# Patient Record
Sex: Female | Born: 1990 | Race: Black or African American | Hispanic: No | Marital: Married | Smoking: Never smoker
Health system: Southern US, Community
[De-identification: ages and names within clinical notes are randomized; demographics above are authoritative.]

## PROBLEM LIST (undated history)

## (undated) DIAGNOSIS — J329 Chronic sinusitis, unspecified: Secondary | ICD-10-CM

## (undated) DIAGNOSIS — D509 Iron deficiency anemia, unspecified: Secondary | ICD-10-CM

## (undated) DIAGNOSIS — Z789 Other specified health status: Secondary | ICD-10-CM

## (undated) HISTORY — DX: Iron deficiency anemia, unspecified: D50.9

## (undated) HISTORY — PX: APPENDECTOMY: SHX54

---

## 2013-05-14 ENCOUNTER — Ambulatory Visit: Payer: 59

## 2013-06-29 ENCOUNTER — Emergency Department (HOSPITAL_BASED_OUTPATIENT_CLINIC_OR_DEPARTMENT_OTHER)
Admission: EM | Admit: 2013-06-29 | Discharge: 2013-06-29 | Disposition: A | Discharge status: Home / Self Care | Payer: 59 | Attending: Emergency Medicine | Admitting: Emergency Medicine

## 2013-06-29 ENCOUNTER — Encounter (HOSPITAL_BASED_OUTPATIENT_CLINIC_OR_DEPARTMENT_OTHER): Payer: Self-pay | Admitting: Emergency Medicine

## 2013-06-29 ENCOUNTER — Emergency Department (HOSPITAL_BASED_OUTPATIENT_CLINIC_OR_DEPARTMENT_OTHER): Payer: 59

## 2013-06-29 DIAGNOSIS — Y92410 Unspecified street and highway as the place of occurrence of the external cause: Secondary | ICD-10-CM | POA: Insufficient documentation

## 2013-06-29 DIAGNOSIS — IMO0002 Reserved for concepts with insufficient information to code with codable children: Secondary | ICD-10-CM | POA: Insufficient documentation

## 2013-06-29 DIAGNOSIS — M549 Dorsalgia, unspecified: Secondary | ICD-10-CM

## 2013-06-29 DIAGNOSIS — Y9389 Activity, other specified: Secondary | ICD-10-CM | POA: Insufficient documentation

## 2013-06-29 MED ORDER — IBUPROFEN 800 MG PO TABS: 800.0000 mg | ORAL_TABLET | Freq: Three times a day (TID) | ORAL | Status: DC

## 2013-06-29 MED ORDER — IBUPROFEN 800 MG PO TABS
800.0000 mg | ORAL_TABLET | Freq: Once | ORAL | Status: AC
  Administered 2013-06-29: 800 mg via ORAL
  Filled 2013-06-29: qty 1
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
## 2013-06-29 NOTE — ED Provider Notes (Signed)
Medical screening examination/treatment/procedure(s) were performed by non-physician practitioner and as supervising physician I was immediately available for consultation/collaboration.  EKG Interpretation   None         William Herbert Aguinaldo, MD 06/29/13 1610 

## 2013-06-29 NOTE — Discharge Instructions (Signed)
°Back Pain, Adult °Back pain is very common. The pain often gets better over time. The cause of back pain is usually not dangerous. Most people can learn to manage their back pain on their own.  °HOME CARE  °· Stay active. Start with short walks on flat ground if you can. Try to walk farther each day. °· Do not sit, drive, or stand in one place for more than 30 minutes. Do not stay in bed. °· Do not avoid exercise or work. Activity can help your back heal faster. °· Be careful when you bend or lift an object. Bend at your knees, keep the object close to you, and do not twist. °· Sleep on a firm mattress. Lie on your side, and bend your knees. If you lie on your back, put a pillow under your knees. °· Only take medicines as told by your doctor. °· Put ice on the injured area. °· Put ice in a plastic bag. °· Place a towel between your skin and the bag. °· Leave the ice on for 15-20 minutes, 03-04 times a day for the first 2 to 3 days. After that, you can switch between ice and heat packs. °· Ask your doctor about back exercises or massage. °· Avoid feeling anxious or stressed. Find good ways to deal with stress, such as exercise. °GET HELP RIGHT AWAY IF:  °· Your pain does not go away with rest or medicine. °· Your pain does not go away in 1 week. °· You have new problems. °· You do not feel well. °· The pain spreads into your legs. °· You cannot control when you poop (bowel movement) or pee (urinate). °· Your arms or legs feel weak or lose feeling (numbness). °· You feel sick to your stomach (nauseous) or throw up (vomit). °· You have belly (abdominal) pain. °· You feel like you may pass out (faint). °MAKE SURE YOU:  °· Understand these instructions. °· Will watch your condition. °· Will get help right away if you are not doing well or get worse. °Document Released: 10/08/2007 Document Revised: 07/14/2011 Document Reviewed: 09/09/2010 °ExitCare® Patient Information ©2014 [employer]. °Motor Vehicle Collision  °It  is common to have multiple bruises and sore muscles after a motor vehicle collision (MVC). These tend to feel worse for the first 24 hours. You may have the most stiffness and soreness over the first several hours. You may also feel worse when you wake up the first morning after your collision. After this point, you will usually begin to improve with each day. The speed of improvement often depends on the severity of the collision, the number of injuries, and the location and nature of these injuries. °HOME CARE INSTRUCTIONS  °· Put ice on the injured area. °· Put ice in a plastic bag. °· Place a towel between your skin and the bag. °· Leave the ice on for 15-20 minutes, 03-04 times a day. °· Drink enough fluids to keep your urine clear or pale yellow. Do not drink alcohol. °· Take a warm shower or bath once or twice a day. This will increase blood flow to sore muscles. °· You may return to activities as directed by your caregiver. Be careful when lifting, as this may aggravate neck or back pain. °· Only take over-the-counter or prescription medicines for pain, discomfort, or fever as directed by your caregiver. Do not use aspirin. This may increase bruising and bleeding. °SEEK IMMEDIATE MEDICAL CARE IF: °· You have numbness, tingling, or   weakness in the arms or legs. °· You develop severe headaches not relieved with medicine. °· You have severe neck pain, especially tenderness in the middle of the back of your neck. °· You have changes in bowel or bladder control. °· There is increasing pain in any area of the body. °· You have shortness of breath, lightheadedness, dizziness, or fainting. °· You have chest pain. °· You feel sick to your stomach (nauseous), throw up (vomit), or sweat. °· You have increasing abdominal discomfort. °· There is blood in your urine, stool, or vomit. °· You have pain in your shoulder (shoulder strap areas). °· You feel your symptoms are getting worse. °MAKE SURE YOU:  °· Understand these  instructions. °· Will watch your condition. °· Will get help right away if you are not doing well or get worse. °Document Released: 04/21/2005 Document Revised: 07/14/2011 Document Reviewed: 09/18/2010 °ExitCare® Patient Information ©2014 [employer]. ° °
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
## 2014-05-05 NOTE — L&D Delivery Note (Signed)
Delivery Note At 1:48 AM a viable female was delivered via Vaginal, Spontaneous Delivery (Presentation: ;  ).  APGAR: , ; weight  .   Placenta status: , .  Cord:  with the following complications: .  Cord pH: not done  Anesthesia:   Episiotomy:   Lacerations:   Suture Repair: 2.0 vicryl Est. Blood Loss (mL):    Mom to postpartum.  Baby to Couplet care / Skin to Skin.  MARSHALL,BERNARD A 08/09/2014, 2:18 AM

## 2014-05-11 ENCOUNTER — Encounter (HOSPITAL_COMMUNITY): Payer: Self-pay | Admitting: *Deleted

## 2014-05-11 ENCOUNTER — Inpatient Hospital Stay (HOSPITAL_COMMUNITY)
Admission: AD | Admit: 2014-05-11 | Discharge: 2014-05-11 | Disposition: A | Discharge status: Home / Self Care | Payer: Medicaid Other | Source: Ambulatory Visit | Attending: Obstetrics & Gynecology | Admitting: Obstetrics & Gynecology

## 2014-05-11 DIAGNOSIS — O9989 Other specified diseases and conditions complicating pregnancy, childbirth and the puerperium: Secondary | ICD-10-CM | POA: Insufficient documentation

## 2014-05-11 DIAGNOSIS — Z3A24 24 weeks gestation of pregnancy: Secondary | ICD-10-CM | POA: Insufficient documentation

## 2014-05-11 DIAGNOSIS — R109 Unspecified abdominal pain: Secondary | ICD-10-CM

## 2014-05-11 DIAGNOSIS — N949 Unspecified condition associated with female genital organs and menstrual cycle: Secondary | ICD-10-CM

## 2014-05-11 DIAGNOSIS — Z3A26 26 weeks gestation of pregnancy: Secondary | ICD-10-CM

## 2014-05-11 HISTORY — DX: Chronic sinusitis, unspecified: J32.9

## 2014-05-11 LAB — URINALYSIS, ROUTINE W REFLEX MICROSCOPIC
Bilirubin Urine: NEGATIVE
GLUCOSE, UA: NEGATIVE mg/dL
Hgb urine dipstick: NEGATIVE
Ketones, ur: NEGATIVE mg/dL
Nitrite: NEGATIVE
PH: 7 (ref 5.0–8.0)
Protein, ur: NEGATIVE mg/dL
SPECIFIC GRAVITY, URINE: 1.01 (ref 1.005–1.030)
Urobilinogen, UA: 0.2 mg/dL (ref 0.0–1.0)

## 2014-05-11 LAB — URINE MICROSCOPIC-ADD ON

## 2014-05-11 NOTE — Discharge Instructions (Signed)

## 2014-05-11 NOTE — MAU Note (Signed)
Pt reprots she has not been feeling well. Feeling nauseated and dizzy. C/O sharp RLQ that starte 2 days ago.

## 2014-05-11 NOTE — MAU Note (Signed)
Urine in lab 

## 2014-05-11 NOTE — MAU Provider Note (Signed)
History    HPI: Lauren Potter is a 24 y.o. year old G1P0 female at [redacted]w[redacted]d weeks gestation who presents to MAU reporting abdominal pain (diffuse) described as burning in nature. And right sided abdominal tenderness. Patient states this pain began about 2 days ago and has gotten worse since that time. The diffuse abdominal pain is reportedly unassociated from the rt-sided pain. The diffuse pain is burning and localized bilaterally in the lower quadrants and is normally accompanied with some back pain. This pain is worsened with prolonged standing and movement. Patient reported some constipation.  Patient denied fever, chills, n/v/d, HA, vision change, weakness, dysuria, polydipsia, VB, vaginal discharge.  Patient is from the [place]. She received her initial prenatal care there for this pregnancy. She reports some sparse vaginal bleeding initially during this pregnancy. This was followed by 2 separate US's in [place] and was told that she was ok. Patient could not remember any specific details. She came back to the [place] in November (after 5months in [place]). Plans to see Dr. Gaynell Face next week.  PSH: patient is s/p appendectomy from "a long time ago"   CSN: [id number]  Arrival date and time: 05/11/14 1505   None     Chief Complaint  Patient presents with  . Dizziness  . Abdominal Pain   HPI  OB History    Gravida Para Term Preterm AB TAB SAB Ectopic Multiple Living   1         0      Past Medical History  Diagnosis Date  . Frequent sinus infections   . Depression     Past Surgical History  Procedure Laterality Date  . Appendectomy      History reviewed. No pertinent family history.  History  Substance Use Topics  . Smoking status: Never Smoker   . Smokeless tobacco: Not on file  . Alcohol Use: No    Allergies: No Known Allergies  Prescriptions prior to admission  Medication Sig Dispense Refill Last Dose  . ibuprofen (ADVIL,MOTRIN) 800 MG tablet Take 1 tablet (800 mg  total) by mouth 3 (three) times daily. (Patient not taking: Reported on 05/11/2014) 21 tablet 0     ROS Physical Exam   Blood pressure 128/71, pulse 66, temperature 98.1 F (36.7 C), resp. rate 18, height  (1.727 m), weight 81.103 kg (178 lb 12.8 oz), last menstrual period 11/22/2013.  Physical Exam  Constitutional: She is oriented to person, place, and time. She appears well-developed and well-nourished. No distress.  HENT:  Head: Normocephalic and atraumatic.  Eyes: Conjunctivae and EOM are normal. Pupils are equal, round, and reactive to light.  Neck: Normal range of motion. Neck supple.  Cardiovascular: Normal rate, regular rhythm, normal heart sounds and intact distal pulses.   Respiratory: Effort normal and breath sounds normal. She has no wheezes.  GI: Soft. There is tenderness. There is no rebound and no guarding.  RLQ and LLQ (R>L)  Genitourinary: No vaginal discharge found.  Musculoskeletal: Normal range of motion. She exhibits no edema.  Neurological: She is alert and oriented to person, place, and time.  Skin: Skin is warm and dry. No rash noted. She is not diaphoretic. No erythema.  Psychiatric: She has a normal mood and affect. Her behavior is normal. Judgment and thought content normal.    MAU Course  Procedures  MDM Pain patient is reporting is likely due to ligamental changes. Patient is a G1P0 and likely experiencing these changes for the first time. Ddx includes UTI/pyelonephritis/constipation.  UA positive for leukocytes but negative for all other signs/symptoms. Some Rt sided flank pain but no history of dysuria. No fevers/tachycardia.  Assessment and Plan  - encouraged patient to keep appt w/ obgyn for establishment of care. - sent urine sample for Urine Culture. - antibiotic therapy inappropriate at this time. Will watch for cultures. - DC from MAU   McKeag, Janine Oresan D 05/11/2014, 6:25 PM    I have seen and examined this patient and agree the above  assessment. CRESENZO-DISHMAN,Karandeep Resende 05/11/2014 7:14 PM

## 2014-05-11 NOTE — MAU Note (Signed)
States pain comes and goes and hurts on R side when she tries to sleep.

## 2014-05-12 LAB — CULTURE, OB URINE
Colony Count: NO GROWTH
Culture: NO GROWTH

## 2014-05-16 LAB — PROCEDURE REPORT - SCANNED: PAP SMEAR: NEGATIVE

## 2014-05-17 LAB — OB RESULTS CONSOLE RUBELLA ANTIBODY, IGM: RUBELLA: IMMUNE

## 2014-05-17 LAB — OB RESULTS CONSOLE ANTIBODY SCREEN: Antibody Screen: NEGATIVE

## 2014-05-17 LAB — OB RESULTS CONSOLE HIV ANTIBODY (ROUTINE TESTING): HIV: NONREACTIVE

## 2014-05-17 LAB — OB RESULTS CONSOLE RPR: RPR: NONREACTIVE

## 2014-05-17 LAB — OB RESULTS CONSOLE GC/CHLAMYDIA
Chlamydia: NEGATIVE
Gonorrhea: NEGATIVE

## 2014-05-17 LAB — OB RESULTS CONSOLE HEPATITIS B SURFACE ANTIGEN: HEP B S AG: NEGATIVE

## 2014-05-17 LAB — OB RESULTS CONSOLE ABO/RH: RH TYPE: POSITIVE

## 2014-07-25 LAB — OB RESULTS CONSOLE GBS: STREP GROUP B AG: POSITIVE

## 2014-08-08 ENCOUNTER — Inpatient Hospital Stay (HOSPITAL_COMMUNITY)
Admission: AD | Admit: 2014-08-08 | Discharge: 2014-08-11 | DRG: 775 | Disposition: A | Discharge status: Home / Self Care | Payer: Medicaid Other | Source: Ambulatory Visit | Attending: Obstetrics | Admitting: Obstetrics

## 2014-08-08 ENCOUNTER — Encounter (HOSPITAL_COMMUNITY): Payer: Self-pay | Admitting: *Deleted

## 2014-08-08 DIAGNOSIS — O99824 Streptococcus B carrier state complicating childbirth: Principal | ICD-10-CM | POA: Diagnosis present

## 2014-08-08 DIAGNOSIS — IMO0001 Reserved for inherently not codable concepts without codable children: Secondary | ICD-10-CM

## 2014-08-08 HISTORY — DX: Other specified health status: Z78.9

## 2014-08-08 LAB — CBC
HCT: 36.3 % (ref 36.0–46.0)
Hemoglobin: 12.1 g/dL (ref 12.0–15.0)
MCH: 23.1 pg — ABNORMAL LOW (ref 26.0–34.0)
MCHC: 33.3 g/dL (ref 30.0–36.0)
MCV: 69.4 fL — ABNORMAL LOW (ref 78.0–100.0)
Platelets: 167 10*3/uL (ref 150–400)
RBC: 5.23 MIL/uL — AB (ref 3.87–5.11)
RDW: 15.8 % — AB (ref 11.5–15.5)
WBC: 6.5 10*3/uL (ref 4.0–10.5)

## 2014-08-08 LAB — TYPE AND SCREEN
ABO/RH(D): B POS
Antibody Screen: NEGATIVE

## 2014-08-08 LAB — ABO/RH: ABO/RH(D): B POS

## 2014-08-08 LAB — POCT FERN TEST: POCT Fern Test: POSITIVE

## 2014-08-08 MED ORDER — OXYCODONE-ACETAMINOPHEN 5-325 MG PO TABS
1.0000 | ORAL_TABLET | ORAL | Status: DC | PRN
  Administered 2014-08-09 – 2014-08-11 (×2): 1 via ORAL
  Filled 2014-08-08 (×2): qty 1

## 2014-08-08 MED ORDER — LACTATED RINGERS IV SOLN
INTRAVENOUS | Status: DC
  Administered 2014-08-08: 19:00:00 via INTRAVENOUS

## 2014-08-08 MED ORDER — OXYTOCIN 40 UNITS IN LACTATED RINGERS INFUSION - SIMPLE MED
62.5000 mL/h | INTRAVENOUS | Status: DC
  Administered 2014-08-09: 62.5 mL/h via INTRAVENOUS
  Filled 2014-08-08: qty 1000

## 2014-08-08 MED ORDER — ONDANSETRON HCL 4 MG/2ML IJ SOLN: 4.0000 mg | Freq: Four times a day (QID) | INTRAMUSCULAR | Status: DC | PRN

## 2014-08-08 MED ORDER — LACTATED RINGERS IV SOLN: 500.0000 mL | INTRAVENOUS | Status: DC | PRN

## 2014-08-08 MED ORDER — OXYTOCIN BOLUS FROM INFUSION
500.0000 mL | INTRAVENOUS | Status: DC
  Administered 2014-08-09: 500 mL via INTRAVENOUS

## 2014-08-08 MED ORDER — LIDOCAINE HCL (PF) 1 % IJ SOLN
30.0000 mL | INTRAMUSCULAR | Status: AC | PRN
  Administered 2014-08-09: 30 mL via SUBCUTANEOUS
  Filled 2014-08-08: qty 30

## 2014-08-08 MED ORDER — BUTORPHANOL TARTRATE 1 MG/ML IJ SOLN: 1.0000 mg | INTRAMUSCULAR | Status: DC | PRN

## 2014-08-08 MED ORDER — CITRIC ACID-SODIUM CITRATE 334-500 MG/5ML PO SOLN
30.0000 mL | ORAL | Status: DC | PRN
Start: 2014-08-08 — End: 2014-08-09

## 2014-08-08 MED ORDER — ACETAMINOPHEN 325 MG PO TABS: 650.0000 mg | ORAL_TABLET | ORAL | Status: DC | PRN

## 2014-08-08 MED ORDER — PENICILLIN G POTASSIUM 5000000 UNITS IJ SOLR
2.5000 10*6.[IU] | INTRAMUSCULAR | Status: DC
  Administered 2014-08-08: 2.5 10*6.[IU] via INTRAVENOUS
  Filled 2014-08-08 (×5): qty 2.5

## 2014-08-08 MED ORDER — PENICILLIN G POTASSIUM 5000000 UNITS IJ SOLR
5.0000 10*6.[IU] | Freq: Once | INTRAVENOUS | Status: AC
  Administered 2014-08-08: 5 10*6.[IU] via INTRAVENOUS
  Filled 2014-08-08: qty 5

## 2014-08-08 MED ORDER — OXYCODONE-ACETAMINOPHEN 5-325 MG PO TABS: 2.0000 | ORAL_TABLET | ORAL | Status: DC | PRN

## 2014-08-08 NOTE — MAU Note (Signed)
SROM around 1700, pink fluid.  Pt continues to leak in triage.  Contractions since this a.m.

## 2014-08-09 ENCOUNTER — Encounter (HOSPITAL_COMMUNITY): Payer: Self-pay | Admitting: *Deleted

## 2014-08-09 LAB — CBC
HEMATOCRIT: 31.1 % — AB (ref 36.0–46.0)
Hemoglobin: 10.1 g/dL — ABNORMAL LOW (ref 12.0–15.0)
MCH: 22.3 pg — AB (ref 26.0–34.0)
MCHC: 32.5 g/dL (ref 30.0–36.0)
MCV: 68.8 fL — ABNORMAL LOW (ref 78.0–100.0)
Platelets: 153 10*3/uL (ref 150–400)
RBC: 4.52 MIL/uL (ref 3.87–5.11)
RDW: 15.7 % — ABNORMAL HIGH (ref 11.5–15.5)
WBC: 11 10*3/uL — ABNORMAL HIGH (ref 4.0–10.5)

## 2014-08-09 LAB — RPR: RPR Ser Ql: NONREACTIVE

## 2014-08-09 LAB — HIV ANTIBODY (ROUTINE TESTING W REFLEX): HIV Screen 4th Generation wRfx: NONREACTIVE

## 2014-08-09 MED ORDER — ONDANSETRON HCL 4 MG/2ML IJ SOLN: 4.0000 mg | INTRAMUSCULAR | Status: DC | PRN

## 2014-08-09 MED ORDER — DIPHENHYDRAMINE HCL 25 MG PO CAPS: 25.0000 mg | ORAL_CAPSULE | Freq: Four times a day (QID) | ORAL | Status: DC | PRN

## 2014-08-09 MED ORDER — DIBUCAINE 1 % RE OINT: 1.0000 "application " | TOPICAL_OINTMENT | RECTAL | Status: DC | PRN

## 2014-08-09 MED ORDER — FERROUS SULFATE 325 (65 FE) MG PO TABS
325.0000 mg | ORAL_TABLET | Freq: Two times a day (BID) | ORAL | Status: DC
  Administered 2014-08-09 – 2014-08-11 (×5): 325 mg via ORAL
  Filled 2014-08-09 (×5): qty 1

## 2014-08-09 MED ORDER — SENNOSIDES-DOCUSATE SODIUM 8.6-50 MG PO TABS
2.0000 | ORAL_TABLET | ORAL | Status: DC
  Administered 2014-08-10 – 2014-08-11 (×2): 2 via ORAL
  Filled 2014-08-09 (×2): qty 2

## 2014-08-09 MED ORDER — SIMETHICONE 80 MG PO CHEW: 80.0000 mg | CHEWABLE_TABLET | ORAL | Status: DC | PRN

## 2014-08-09 MED ORDER — LANOLIN HYDROUS EX OINT: TOPICAL_OINTMENT | CUTANEOUS | Status: DC | PRN

## 2014-08-09 MED ORDER — OXYCODONE-ACETAMINOPHEN 5-325 MG PO TABS: 2.0000 | ORAL_TABLET | ORAL | Status: DC | PRN

## 2014-08-09 MED ORDER — IBUPROFEN 600 MG PO TABS
600.0000 mg | ORAL_TABLET | Freq: Four times a day (QID) | ORAL | Status: DC
Start: 2014-08-09 — End: 2014-08-11
  Administered 2014-08-09 – 2014-08-11 (×8): 600 mg via ORAL
  Filled 2014-08-09 (×8): qty 1

## 2014-08-09 MED ORDER — BENZOCAINE-MENTHOL 20-0.5 % EX AERO
1.0000 "application " | INHALATION_SPRAY | CUTANEOUS | Status: DC | PRN
  Administered 2014-08-09: 1 via TOPICAL
  Filled 2014-08-09: qty 56

## 2014-08-09 MED ORDER — ZOLPIDEM TARTRATE 5 MG PO TABS: 5.0000 mg | ORAL_TABLET | Freq: Every evening | ORAL | Status: DC | PRN

## 2014-08-09 MED ORDER — ONDANSETRON HCL 4 MG PO TABS: 4.0000 mg | ORAL_TABLET | ORAL | Status: DC | PRN

## 2014-08-09 MED ORDER — TETANUS-DIPHTH-ACELL PERTUSSIS 5-2.5-18.5 LF-MCG/0.5 IM SUSP
0.5000 mL | Freq: Once | INTRAMUSCULAR | Status: AC
  Administered 2014-08-10: 0.5 mL via INTRAMUSCULAR

## 2014-08-09 MED ORDER — PRENATAL MULTIVITAMIN CH
1.0000 | ORAL_TABLET | Freq: Every day | ORAL | Status: DC
  Administered 2014-08-09 – 2014-08-10 (×2): 1 via ORAL
  Filled 2014-08-09 (×2): qty 1

## 2014-08-09 MED ORDER — ACETAMINOPHEN 325 MG PO TABS: 650.0000 mg | ORAL_TABLET | ORAL | Status: DC | PRN

## 2014-08-09 MED ORDER — WITCH HAZEL-GLYCERIN EX PADS
1.0000 | MEDICATED_PAD | CUTANEOUS | Status: DC | PRN
Start: 2014-08-09 — End: 2014-08-11

## 2014-08-09 MED ORDER — OXYCODONE-ACETAMINOPHEN 5-325 MG PO TABS
1.0000 | ORAL_TABLET | ORAL | Status: DC | PRN
  Administered 2014-08-11: 1 via ORAL
  Filled 2014-08-09: qty 1

## 2014-08-09 NOTE — Progress Notes (Signed)
Ur chart review completed.  

## 2014-08-09 NOTE — H&P (Signed)
This is Dr. Francoise CeoBernard Marshall dictating the history and physical on  Lauren Potter's a 24 year old gravida 1 at 530 [facility] GBS for which she got penicillin her membranes ruptured at 5 PM yesterday and she came in in labor and progressed satisfactorily and had a normal vaginal delivery of a female Apgar 3589 from the LOA position placenta spontaneous intact and she had a right sulcus tear which was repaired with 2-0 Vicryl the patient also had a positive GBS and was treated with penicillin Past medical history negative Past surgical history negative Social history negative System review negative Physical exam well-developed female post delivery HEENT negative Lungs clear to P&A Heart regular rhythm no murmurs no gallops Breasts negative Abdomen uterus 20 week size post delivery Pelvic as explained above extremities negative

## 2014-08-09 NOTE — Lactation Note (Signed)
This note was copied from the chart of Lauren Millette Potter. Lactation Consultation Note  P1, Baby has breastfed 4x since birth, 2 voids, 1 stool. Baby recently bf for 20 min.  Attempted in football hold but baby sleepy. Demonstrated how to massage breast during feeding. Reviewed hand expression w/ mother but not drops expressed yet. Encouraged mother to hand express prior to feeding. Reviewed cluster feeding, supply and demand and stomach size. Mom encouraged to feed baby 8-12 times/24 hours and with feeding cues.  Mom made aware of O/P services, breastfeeding support groups, community resources, and our phone # for post-discharge questions.      Patient Name: Lauren Potter ZOXWR'UToday's Date: 08/09/2014 Reason for consult: Initial assessment   Maternal Data    Feeding Feeding Type: Breast Fed Length of feed: 20 min  LATCH Score/Interventions Latch: Grasps breast easily, tongue down, lips flanged, rhythmical sucking.  Audible Swallowing: A few with stimulation Intervention(s): Skin to skin  Type of Nipple: Everted at rest and after stimulation  Comfort (Breast/Nipple): Soft / non-tender     Hold (Positioning): Full assist, staff holds infant at breast  LATCH Score: 7  Lactation Tools Discussed/Used     Consult Status Consult Status: Follow-up Date: 08/10/14 Follow-up type: In-patient    [name], [name] [facility] 08/09/2014, 1:06 PM

## 2014-08-10 NOTE — Lactation Note (Signed)
This note was copied from the chart of Lauren Destynie Potter. Lactation Consultation Note  Mom is concerned that Lauren Potter is not eating.  I reviewed his chaVicente Serenert with her and gave her reassurance that he was eating well and that his output was good.  Mom's breasts a filling and are firm in some areas. She also has lymph tissue in the axilla that is becoming firm.  We reviewed how to use the hand pump and how to hand express. Her flange was changed to a #27.  I placed her in a supine position and showed her how to do breast massage to help the lymph tissue drain. Her breasts became softer with massage and expression of milk.  Vicente SereneGabriel was not cueing while I was working with mom though he was on her chest and skin to skin.  I spoon fed him the expressed colostrum and he began to cue. Visitors entered the room at this time (not family) and I encouraged them to keep the visit brief related to baby cueing and mom need to resolve her breast filling. Mom was agreeable to this.  Follow-up by evening lactation consultant. Patient Name: Lauren Potter [id number]'s Date: 08/10/2014 Reason for consult: Follow-up assessment;Other (Comment) (mom concerned that baby is not feeding well)   Maternal Data Has patient been taught Hand Expression?: Yes  Feeding Feeding Type: Breast Milk  LATCH Score/Interventions Latch: Too sleepy or reluctant, no latch achieved, no sucking elicited.  Audible Swallowing: None  Type of Nipple: Everted at rest and after stimulation (Edema)  Comfort (Breast/Nipple): Filling, red/small blisters or bruises, mild/mod discomfort  Problem noted: Mild/Moderate discomfort Interventions (Mild/moderate discomfort): Hand massage;Hand expression;Pre-pump if needed  Hold (Positioning): No assistance needed to correctly position infant at breast.  LATCH Score: 5  Lactation Tools Discussed/Used     Consult Status Consult Status: Follow-up Date: 08/11/14 Follow-up type:  In-patient    Soyla DryerJoseph, Saleem Coccia 08/10/2014, 6:31 PM

## 2014-08-10 NOTE — Progress Notes (Signed)
Patient ID: Lauren Potter, female   DOB: 1991-03-06, 24 y.o.   MRN: [id number] 6 postpartum day one Vital signs normal Fundus firm Lochia moderate Doing well

## 2014-08-11 NOTE — Discharge Instructions (Signed)
Discharge instructions   You can wash your hair  Shower  Eat what you want  Drink what you want  See me in 6 weeks  Your ankles are going to swell more in the next 2 weeks than when pregnant  No sex for 6 weeks   Lauren Potter A, MD 08/11/2014

## 2014-08-11 NOTE — Lactation Note (Signed)
This note was copied from the chart of Lauren Potter. Lactation Consultation Note Mom in severe pain w/engorgement, breast firm w/knots, hard to upper chest and under axillary "tail of spense".  DEBP started w/set up and cleaning. Mom in severe pain, baby fussy. Baby given colostrum in bottle w/slow flow nipple. ICE applied bilatterally. #30 flange applied to DEBP and breast massaged. I massaged on breast and the nurse the other. Mom pumped 80ml rich yellow/orange colostrum.  Baby has upper labial frenulum and possible posterior limited tongue. Does move tongue slightly to gum. Mom has very large nipples, I feel that the baby hasn't been getting good depth and can't get a good milk transfer causing engorgement and back up of milk.  ICE applied to axillary areas where the most pain is occuring. RN gave pain medication. Grandmother gave some colostrum in bottle. Encour[facility] mom and [facility] and I would apply NS and attempt to latch baby.  Patient Name: Lauren Baldo DaubBenite Potter ZOXWR'UToday's Date: 08/11/2014 Reason for consult: Follow-up assessment;Breast/nipple pain   Maternal Data    Feeding Feeding Type: Breast Milk  LATCH Score/Interventions Intervention(s): Breast compression;Breast massage  Intervention(s): Hand expression;Alternate breast massage  Type of Nipple: Everted at rest and after stimulation  Comfort (Breast/Nipple): Engorged, cracked, bleeding, large blisters, severe discomfort Problem noted: Engorgment  Problem noted: Severe discomfort Interventions (Mild/moderate discomfort): Hand massage;Hand expression;Comfort gels;Pre-pump if needed;Breast shields Interventions (Severe discomfort): Double electric pum  Hold (Positioning): Full assist, staff holds infant at breast Intervention(s): Breastfeeding basics reviewed;Support Pillows;Position options;Skin to skin     Lactation Tools Discussed/Used Tools: Nipple Shields;Pump;Bottle Nipple shield  size: 24 (will try to see if baby can latch) Breast pump type: Double-Electric Breast Pump Pump Review: Setup, frequency, and cleaning;Milk Storage Initiated by:: Peri JeffersonL. Dezarae Mcclaran RN Date initiated:: 08/11/14   Consult Status Consult Status: Follow-up Date: 08/11/14 Follow-up type: In-patient    Annalisa Colonna, Diamond NickelLAURA G 08/11/2014, 3:00 AM

## 2014-08-11 NOTE — Discharge Summary (Signed)
Obstetric Discharge Summary Reason for Admission: onset of labor Prenatal Procedures: none Intrapartum Procedures: spontaneous vaginal delivery Postpartum Procedures: none Complications-Operative and Postpartum: none HEMOGLOBIN  Date Value Ref Range Status  08/09/2014 10.1* 12.0 - 15.0 g/dL Final   HCT  Date Value Ref Range Status  08/09/2014 31.1* 36.0 - 46.0 % Final    Physical Exam:  General: alert Lochia: appropriate Uterine Fundus: firm Incision: healing well DVT Evaluation: No evidence of DVT seen on physical exam.  Discharge Diagnoses: Term Pregnancy-delivered  Discharge Information: Date: 08/11/2014 Activity: pelvic rest Diet: routine Medications: None Condition: improved Instructions: refer to practice specific booklet Discharge to: home Follow-up Information    Follow up with Kathreen CosierMARSHALL,Terrianne Cavness A, MD.   Specialty:  Obstetrics and Gynecology   Contact information:   [address] [place] [address] [phone]       Newborn Data: Live born female  Birth Weight: 6 lb 10.2 oz (3010 g) APGAR: 9, 9  Home with mother.  Tirso Laws A 08/11/2014, 6:43 AM

## 2014-08-11 NOTE — Progress Notes (Signed)
Patient ID: Lauren Potter, female   DOB: 1990-07-22, 24 y.o.   MRN: [id number] Postpartum day 2 Vital signs normal Fundus firm Lochia moderate Legs negative doing well home today

## 2016-01-29 ENCOUNTER — Encounter: Payer: Self-pay | Admitting: *Deleted

## 2016-07-26 ENCOUNTER — Ambulatory Visit (INDEPENDENT_AMBULATORY_CARE_PROVIDER_SITE_OTHER): Payer: 59 | Admitting: Physician Assistant

## 2016-07-26 ENCOUNTER — Ambulatory Visit: Payer: 59

## 2016-07-26 VITALS — BP 118/70 | HR 70 | Temp 99.4°F | Resp 17 | Ht 67.5 in | Wt 182.0 lb

## 2016-07-26 DIAGNOSIS — Z3201 Encounter for pregnancy test, result positive: Secondary | ICD-10-CM

## 2016-07-26 DIAGNOSIS — R531 Weakness: Secondary | ICD-10-CM | POA: Diagnosis not present

## 2016-07-26 LAB — POCT URINE PREGNANCY: Preg Test, Ur: POSITIVE — AB

## 2016-07-26 NOTE — Patient Instructions (Addendum)
Your results show that you are pregnant, therefore we are not going to do your neck xray or your MRI at this point. You need to make sure to schedule an appointment with your OB/GYN. In terms of your other blood work, our office will contact you with the results. If you are still having symptoms after you see your OB, let us know. If anything gets worse please come here or go to the ED immediately.    IF you received an x-ray today, you will receive an invoice from [facility]. Please contact [facility] at [phone] with questions or concerns regarding your invoice.   IF you received labwork today, you will receive an invoice from [facility]. Please contact [facility] at [phone] with questions or concerns regarding your invoice.   Our billing staff will not be able to assist you with questions regarding bills from these companies.  You will be contacted with the lab results as soon as they are available. The fastest way to get your results is to activate your My Chart account. Instructions are located on the last page of this paperwork. If you have not heard from us regarding the results in 2 weeks, please contact this office.

## 2016-07-26 NOTE — Progress Notes (Signed)
Lauren Potter  MRN: [id number] DOB: March 10, 1991  Subjective:  Lauren Potter is a 26 y.o. female seen in office today for a chief complaint of right leg heaviness x 1 week. Has associated cramps, tingling, right arm weakness and pain in the muscles of both right arm and right leg. Has had intermittent headache over the past 2 months, not present today. Denies numbness, vision loss, and slurred speech. She does not work out or exercise regularly. Has personal hx of "brain issue at age 76". 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 Denies smoking. No FH of autoimmune disease.   Of note, pt is from the [facility] and moved here a few years ago.   Review of Systems  Constitutional: Positive for fatigue. Negative for chills, diaphoresis and fever.  HENT: Negative for congestion and sinus pressure.   Eyes: Positive for visual disturbance (will sometimes have blurred vision at night).  Respiratory: Negative for cough and shortness of breath.   Cardiovascular: Negative for chest pain, palpitations and leg swelling.  Gastrointestinal: Positive for nausea (intermittent, not present today). Negative for anal bleeding, diarrhea and vomiting.  Endocrine: Negative for cold intolerance and heat intolerance.  Genitourinary: Negative for dysuria, frequency, hematuria and urgency.  Musculoskeletal: Positive for neck stiffness ("sometimes" on right side ). Negative for neck pain.  Skin: Negative for rash.  Neurological: Positive for light-headedness. Negative for seizures.   Patient Active Problem List   Diagnosis Date Noted  . NVD (normal vaginal delivery) 08/09/2014  . Active labor at term 08/08/2014    No current outpatient prescriptions on file prior to visit.   No current facility-administered medications on file prior to visit.     No Known Allergies      Social History   Social History  . Marital status: Married    Spouse name: N/A  . Number of children: N/A  . Years of education: N/A   Occupational History  . Not on file.   Social History Main Topics  . Smoking status: Never Smoker  . Smokeless tobacco: Never Used  . Alcohol use No  . Drug use: No  . Sexual activity: Yes    Birth control/ protection: None   Other Topics Concern  . Not on file   Social History Narrative  . No narrative on file   Family History  Problem Relation Age of Onset  . Diabetes Mother   . Hypertension Father     Objective:  BP 118/70   Pulse 70   Temp 99.4 F (37.4 C) (Oral)   Resp 17   Ht 5' 7.5" (1.715 m)   Wt 182 lb (82.6 kg)   LMP 06/20/2016   SpO2 97%   BMI 28.08 kg/m   Physical Exam  Constitutional: She is oriented to person, place, and time.  Well developed, well nourished female. Appears tearful during certain points of the history taking.   HENT:  Head: Normocephalic and atraumatic.  Eyes: Conjunctivae and EOM are normal. Pupils are equal, round, and reactive to light.  Neck: Normal range of motion and full passive range of motion without pain. Muscular tenderness (right sided musculature) present. No spinous process tenderness present. No neck rigidity. Normal range of motion present.  Cardiovascular: Normal rate, regular rhythm and normal heart sounds.  Exam reveals no gallop and no friction rub.   No murmur  heard. Pulmonary/Chest: Effort normal and breath sounds normal.  Musculoskeletal: Normal range of motion.  Neurological: She is alert and oriented to person, place, and time. She has normal sensation, normal strength, normal reflexes and intact cranial nerves. She displays no atrophy, no tremor, facial symmetry and normal speech. She has a normal Finger-Nose-Finger Test, a normal Heel to [employer], a normal Romberg Test and a normal Tandem Gait Test. Gait normal. Gait normal.  Skin: Skin is warm and dry.   Psychiatric: Affect normal.  Vitals reviewed.  Results for orders placed or performed in visit on 07/26/16 (from the past 72 hour(s))  CBC with Differential/Platelet     Status: Abnormal   Collection Time: 07/26/16  9:56 AM  Result Value Ref Range   WBC 5.0 3.4 - 10.8 x10E3/uL   RBC 5.57 (H) 3.77 - 5.28 x10E6/uL   Hemoglobin 10.4 (L) 11.1 - 15.9 g/dL   Hematocrit 35.7 34.0 - 46.6 %   MCV 64 (L) 79 - 97 fL   MCH 18.7 (L) 26.6 - 33.0 pg   MCHC 29.1 (L) 31.5 - 35.7 g/dL   RDW 20.6 (H) 12.3 - 15.4 %   Platelets 235 150 - 379 x10E3/uL   Neutrophils 45 Not Estab. %   Lymphs 47 Not Estab. %   Monocytes 7 Not Estab. %   Eos 1 Not Estab. %   Basos 0 Not Estab. %   Neutrophils Absolute 2.2 1.4 - 7.0 x10E3/uL   Lymphocytes Absolute 2.3 0.7 - 3.1 x10E3/uL   Monocytes Absolute 0.3 0.1 - 0.9 x10E3/uL   EOS (ABSOLUTE) 0.1 0.0 - 0.4 x10E3/uL   Basophils Absolute 0.0 0.0 - 0.2 x10E3/uL   Immature Granulocytes 0 Not Estab. %   Immature Grans (Abs) 0.0 0.0 - 0.1 x10E3/uL  RPR     Status: None   Collection Time: 07/26/16  9:56 AM  Result Value Ref Range   RPR Ser Ql Non Reactive Non Reactive  TSH     Status: None   Collection Time: 07/26/16  9:56 AM  Result Value Ref Range   TSH 1.970 0.450 - 4.500 uIU/mL  CMP14+EGFR     Status: Abnormal   Collection Time: 07/26/16  9:56 AM  Result Value Ref Range   Glucose 85 65 - 99 mg/dL   BUN 9 6 - 20 mg/dL   Creatinine, Ser 0.74 0.57 - 1.00 mg/dL   GFR calc non Af Amer 114 >59 mL/min/1.73   GFR calc Af Amer 131 >59 mL/min/1.73   BUN/Creatinine Ratio 12 9 - 23   Sodium 138 134 - 144 mmol/L   Potassium 4.2 3.5 - 5.2 mmol/L   Chloride 100 96 - 106 mmol/L   CO2 22 18 - 29 mmol/L   Calcium 9.4 8.7 - 10.2 mg/dL   Total Protein 7.7 6.0 - 8.5 g/dL   Albumin 4.3 3.5 - 5.5 g/dL   Globulin, Total 3.4 1.5 - 4.5 g/dL   Albumin/Globulin Ratio 1.3 1.2 - 2.2   Bilirubin Total <0.2 0.0 - 1.2 mg/dL   Alkaline Phosphatase 36 (L) 39 - 117 IU/L   AST 14 0 - 40  IU/L   ALT 12 0 - 32 IU/L  HIV antibody     Status: None   Collection Time: 07/26/16  9:56 AM  Result Value Ref Range   HIV Screen 4th Generation wRfx Non Reactive Non Reactive  POCT urine pregnancy     Status: Abnormal   Collection Time: 07/26/16 10:03 AM  Result Value  Ref Range   Preg Test, Ur Positive (A) Negative    Assessment and Plan :  This case was precepted with Dr. Mingo Amber, who also evaluated the patient.  1. Right sided weakness PE findings reassuring, as pt's neuro exam is completely normal. Pt had incidental finding of pregnancy prior to completing cervical spine plain films for further evaluation of her symptoms. Xray was therefore not performed. Had also discussed performing an MR w/wo contrast for further evaluation of neurologic symptoms. However, after discussion with the radiologist, contrast is not recommended during pregnancy. Therefore I had an in depth conversation with the patient and informed her that she needs to make an appointment with OB/GYN as soon as possible. Once she has been evaluated by OB, if she is still having symptoms, she should contact our office or let her OB provider know as they are more specialized in diagnostic testing preferences during pregnancy. Given strict ED precautions.  - CBC with Differential/Platelet - RPR - TSH - CMP14+EGFR - HIV antibody - POCT urine pregnancy 2. Positive pregnancy test  Tenna Delaine PA-C  [facility] 07/27/2016 3:47 PM

## 2016-07-27 ENCOUNTER — Encounter: Payer: Self-pay | Admitting: Physician Assistant

## 2016-07-27 LAB — CBC WITH DIFFERENTIAL/PLATELET
BASOS ABS: 0 10*3/uL (ref 0.0–0.2)
Basos: 0 %
EOS (ABSOLUTE): 0.1 10*3/uL (ref 0.0–0.4)
Eos: 1 %
Hematocrit: 35.7 % (ref 34.0–46.6)
Hemoglobin: 10.4 g/dL — ABNORMAL LOW (ref 11.1–15.9)
Immature Grans (Abs): 0 10*3/uL (ref 0.0–0.1)
Immature Granulocytes: 0 %
Lymphocytes Absolute: 2.3 10*3/uL (ref 0.7–3.1)
Lymphs: 47 %
MCH: 18.7 pg — AB (ref 26.6–33.0)
MCHC: 29.1 g/dL — AB (ref 31.5–35.7)
MCV: 64 fL — ABNORMAL LOW (ref 79–97)
MONOCYTES: 7 %
MONOS ABS: 0.3 10*3/uL (ref 0.1–0.9)
Neutrophils Absolute: 2.2 10*3/uL (ref 1.4–7.0)
Neutrophils: 45 %
PLATELETS: 235 10*3/uL (ref 150–379)
RBC: 5.57 x10E6/uL — ABNORMAL HIGH (ref 3.77–5.28)
RDW: 20.6 % — AB (ref 12.3–15.4)
WBC: 5 10*3/uL (ref 3.4–10.8)

## 2016-07-27 LAB — CMP14+EGFR
A/G RATIO: 1.3 (ref 1.2–2.2)
ALK PHOS: 36 IU/L — AB (ref 39–117)
ALT: 12 IU/L (ref 0–32)
AST: 14 IU/L (ref 0–40)
Albumin: 4.3 g/dL (ref 3.5–5.5)
BUN/Creatinine Ratio: 12 (ref 9–23)
BUN: 9 mg/dL (ref 6–20)
CALCIUM: 9.4 mg/dL (ref 8.7–10.2)
CHLORIDE: 100 mmol/L (ref 96–106)
CO2: 22 mmol/L (ref 18–29)
Creatinine, Ser: 0.74 mg/dL (ref 0.57–1.00)
GFR calc Af Amer: 131 mL/min/{1.73_m2} (ref >59)
GFR, EST NON AFRICAN AMERICAN: 114 mL/min/{1.73_m2} (ref >59)
GLOBULIN, TOTAL: 3.4 g/dL (ref 1.5–4.5)
Glucose: 85 mg/dL (ref 65–99)
Potassium: 4.2 mmol/L (ref 3.5–5.2)
SODIUM: 138 mmol/L (ref 134–144)
Total Protein: 7.7 g/dL (ref 6.0–8.5)

## 2016-07-27 LAB — HIV ANTIBODY (ROUTINE TESTING W REFLEX): HIV Screen 4th Generation wRfx: NONREACTIVE

## 2016-07-27 LAB — TSH: TSH: 1.97 u[IU]/mL (ref 0.450–4.500)

## 2016-07-27 LAB — RPR: RPR: NONREACTIVE

## 2016-07-29 ENCOUNTER — Encounter (HOSPITAL_COMMUNITY): Payer: Self-pay

## 2016-07-29 ENCOUNTER — Emergency Department (HOSPITAL_COMMUNITY)
Admission: EM | Admit: 2016-07-29 | Discharge: 2016-07-30 | Disposition: A | Discharge status: Home / Self Care | Payer: 59 | Attending: Emergency Medicine | Admitting: Emergency Medicine

## 2016-07-29 DIAGNOSIS — O0281 Inappropriate change in quantitative human chorionic gonadotropin (hCG) in early pregnancy: Secondary | ICD-10-CM | POA: Insufficient documentation

## 2016-07-29 DIAGNOSIS — S29012A Strain of muscle and tendon of back wall of thorax, initial encounter: Secondary | ICD-10-CM | POA: Diagnosis not present

## 2016-07-29 DIAGNOSIS — Y999 Unspecified external cause status: Secondary | ICD-10-CM | POA: Insufficient documentation

## 2016-07-29 DIAGNOSIS — Z3A01 Less than 8 weeks gestation of pregnancy: Secondary | ICD-10-CM | POA: Insufficient documentation

## 2016-07-29 DIAGNOSIS — S29019A Strain of muscle and tendon of unspecified wall of thorax, initial encounter: Secondary | ICD-10-CM

## 2016-07-29 DIAGNOSIS — X58XXXA Exposure to other specified factors, initial encounter: Secondary | ICD-10-CM | POA: Insufficient documentation

## 2016-07-29 DIAGNOSIS — Y929 Unspecified place or not applicable: Secondary | ICD-10-CM | POA: Insufficient documentation

## 2016-07-29 DIAGNOSIS — O9A211 Injury, poisoning and certain other consequences of external causes complicating pregnancy, first trimester: Secondary | ICD-10-CM | POA: Insufficient documentation

## 2016-07-29 DIAGNOSIS — Z3490 Encounter for supervision of normal pregnancy, unspecified, unspecified trimester: Secondary | ICD-10-CM

## 2016-07-29 DIAGNOSIS — Y939 Activity, unspecified: Secondary | ICD-10-CM | POA: Diagnosis not present

## 2016-07-29 LAB — URINALYSIS, ROUTINE W REFLEX MICROSCOPIC
BILIRUBIN URINE: NEGATIVE
Glucose, UA: NEGATIVE mg/dL
KETONES UR: NEGATIVE mg/dL
Nitrite: NEGATIVE
Protein, ur: 30 mg/dL — AB
Specific Gravity, Urine: 1.025 (ref 1.005–1.030)
pH: 5 (ref 5.0–8.0)

## 2016-07-29 MED ORDER — ACETAMINOPHEN 325 MG PO TABS
650.0000 mg | ORAL_TABLET | Freq: Once | ORAL | Status: AC
  Administered 2016-07-29: 650 mg via ORAL
  Filled 2016-07-29: qty 2

## 2016-07-29 NOTE — ED Triage Notes (Signed)
Pt endorses mid and lower back pain since yesterday, Denies urinary sx. Pt ambulatory and able to move all extremities. Pt was here last week for arm and leg pain and supposed to have an MRI but could not due to being pregnant. Pt is [redacted] weeks pregnant. VSS.

## 2016-07-29 NOTE — ED Provider Notes (Signed)
[facility]-EMERGENCY DEPT Provider Note   CSN: [id number] Arrival date & time: 07/29/16  1759    History   Chief Complaint Chief Complaint  Patient presents with  . Back Pain    HPI Lauren Potter is a 26 y.o. female.  27 year old female with no significant past medical history presents to the emergency department for evaluation of back pain. She states that she has been having pain to her mid back which is worse when lying backward as well as with certain movements. She states that her symptoms have been worsening since waking this morning. She denies taking any medications for her symptoms. She has not had any fever, recent trauma or injury, shortness of breath, vomiting, dysuria, urinary frequency, or urinary urgency. No bowel or bladder incontinence. Patient has noted some mild hematuria. She reports being approximately [redacted] weeks pregnant. She believes that the blood may be coming from vaginal bleeding as she has been having some persistent spotting. No passage of large clots.  Patient was secondary complaint of headache to her bilateral temples. She states that headache has been waxing and waning and intermittent. No medications taken for this as well. She states that she was supposed to receive an MRI for complaints of headache and right-sided weakness. She has no complaints of right-sided weakness today. She states that she has a history of this when in the [place], stating "they told me my brain was only working at 50%".   The history is provided by the patient. No language interpreter was used.    Past Medical History:  Diagnosis Date  . Frequent sinus infections   . Medical history non-contributory     Patient Active Problem List   Diagnosis Date Noted  . NVD (normal vaginal delivery) 08/09/2014  . Active labor at term 08/08/2014    Past Surgical History:  Procedure Laterality Date  . APPENDECTOMY      OB History    Gravida Para Term Preterm AB Living   2 1 1     1    SAB TAB Ectopic Multiple Live Births         0 1       Home Medications    Prior to Admission medications   Not on File    Family History Family History  Problem Relation Age of Onset  . Diabetes Mother   . Hypertension Father     Social History Social History  Substance Use Topics  . Smoking status: Never Smoker  . Smokeless tobacco: Never Used  . Alcohol use No     Allergies   Patient has no known allergies.   Review of Systems Review of Systems Ten systems reviewed and are negative for acute change, except as noted in the HPI.    Physical Exam Updated Vital Signs BP (!) 141/70 (BP Location: Right Arm)   Pulse 80   Temp 98 F (36.7 C) (Oral)   Resp 18   Ht 5' 7.5" (1.715 m)   Wt 82.6 kg   LMP 06/25/2016 (Exact Date)   SpO2 98%   BMI 28.08 kg/m   Physical Exam  Constitutional: She is oriented to person, place, and time. She appears well-developed and well-nourished. No distress.  Nontoxic and in NAD  HENT:  Head: Normocephalic and atraumatic.  Eyes: Conjunctivae and EOM are normal. No scleral icterus.  Neck: Normal range of motion.  No meningismus  Cardiovascular: Normal rate, regular rhythm and intact distal pulses.   Pulmonary/Chest: Effort normal. No respiratory distress.  Respirations even and unlabored  Abdominal: Soft. She exhibits no distension. There is no tenderness. There is no guarding.  Soft, nontender abdomen.  Musculoskeletal: Normal range of motion. She exhibits tenderness.       Thoracic back: She exhibits tenderness (mild paraspinal muscle tenderness) and pain. She exhibits normal range of motion, no bony tenderness, no deformity and no spasm.  No bony deformities, step offs, or crepitus. Mild paraspinal muscle tenderness. No spasms.  Neurological: She is alert and oriented to person, place, and time. She exhibits normal muscle tone. Coordination normal.  Sensation to light touch intact in all extremities. Grip strength 5/5  bilaterally. Normal strength against resistance in all major muscle groups bilaterally. Patient ambulatory with steady gait.  Skin: Skin is warm and dry. No rash noted. She is not diaphoretic. No erythema. No pallor.  Psychiatric: She has a normal mood and affect. Her behavior is normal.  Nursing note and vitals reviewed.    ED Treatments / Results  Labs (all labs ordered are listed, but only abnormal results are displayed) Labs Reviewed  URINE CULTURE - Abnormal; Notable for the following:       Result Value   Culture   (*)    Value: 50,000 COLONIES/mL GROUP B STREP(S.AGALACTIAE)ISOLATED CRITICAL RESULT CALLED TO, READ BACK BY AND VERIFIED WITH: [facility] M VESTAL    All other components within normal limits  URINALYSIS, ROUTINE W REFLEX MICROSCOPIC - Abnormal; Notable for the following:    APPearance HAZY (*)    Hgb urine dipstick LARGE (*)    Protein, ur 30 (*)    Leukocytes, UA SMALL (*)    Bacteria, UA RARE (*)    Squamous Epithelial / LPF 0-5 (*)    All other components within normal limits  PREGNANCY, URINE - Abnormal; Notable for the following:    Preg Test, Ur POSITIVE (*)    All other components within normal limits  HCG, QUANTITATIVE, PREGNANCY - Abnormal; Notable for the following:    hCG, Beta Chain, Quant, S 26 (*)    All other components within normal limits  I-STAT CHEM 8, ED - Abnormal; Notable for the following:    Glucose, Bld 104 (*)    Hemoglobin 10.9 (*)    HCT 32.0 (*)    All other components within normal limits  ABO/RH    EKG  EKG Interpretation None       Radiology Results for orders placed or performed during the hospital encounter of 07/29/16  Urine culture  Result Value Ref Range   Specimen Description URINE, RANDOM    Special Requests NONE    Culture (A)     50,000 COLONIES/mL GROUP B STREP(S.AGALACTIAE)ISOLATED CRITICAL RESULT CALLED TO, READ BACK BY AND VERIFIED WITH: [facility] 07/[facility]33 M VESTAL    Report Status  07/31/2016 FINAL   Urinalysis, Routine w reflex microscopic  Result Value Ref Range   Color, Urine YELLOW YELLOW   APPearance HAZY (A) CLEAR   Specific Gravity, Urine 1.025 1.005 - 1.030   pH 5.0 5.0 - 8.0   Glucose, UA NEGATIVE NEGATIVE mg/dL   Hgb urine dipstick LARGE (A) NEGATIVE   Bilirubin Urine NEGATIVE NEGATIVE   Ketones, ur NEGATIVE NEGATIVE mg/dL   Protein, ur 30 (A) NEGATIVE mg/dL   Nitrite NEGATIVE NEGATIVE   Leukocytes, UA SMALL (A) NEGATIVE   RBC / HPF TOO NUMEROUS TO COUNT 0 - 5 RBC/hpf   WBC, UA 6-30 0 - 5 WBC/hpf   Bacteria, UA RARE (  A) NONE SEEN   Squamous Epithelial / LPF 0-5 (A) NONE SEEN   Mucous PRESENT   Pregnancy, urine  Result Value Ref Range   Preg Test, Ur POSITIVE (A) NEGATIVE  hCG, quantitative, pregnancy  Result Value Ref Range   hCG, Beta Chain, Quant, S 26 (H) <5 mIU/mL  I-stat chem 8, ed  Result Value Ref Range   Sodium 140 135 - 145 mmol/L   Potassium 3.8 3.5 - 5.1 mmol/L   Chloride 104 101 - 111 mmol/L   BUN 10 6 - 20 mg/dL   Creatinine, Ser 0.98 0.44 - 1.00 mg/dL   Glucose, Bld 119 (H) 65 - 99 mg/dL   Calcium, Ion 1.47 8.29 - 1.40 mmol/L   TCO2 25 0 - 100 mmol/L   Hemoglobin 10.9 (L) 12.0 - 15.0 g/dL   HCT 56.2 (L) 13.0 - 86.5 %  ABO/Rh  Result Value Ref Range   ABO/RH(D) B POS    No rh immune globuloin NOT A RH IMMUNE GLOBULIN CANDIDATE, PT RH POSITIVE    US Renal  Result Date: 07/30/2016 CLINICAL DATA:  Back pain and hematuria. EXAM: RENAL / URINARY TRACT ULTRASOUND COMPLETE COMPARISON:  None. FINDINGS: Right Kidney: Length: 11.8 cm. Echogenicity within normal limits. No mass or hydronephrosis visualized. No shadowing calculi. Left Kidney: Length: 10.6 cm. Echogenicity within normal limits. No mass or hydronephrosis visualized. No shadowing calculi. Bladder: Appears normal for degree of bladder distention, only minimally distended. IMPRESSION: Normal renal ultrasound. No hydronephrosis or sonographic evidence of renal calculi.  Electronically Signed   By: Rubye Oaks M.D.   On: 07/30/2016 00:36     Procedures Procedures (including critical care time)  Medications Ordered in ED Medications  acetaminophen (TYLENOL) tablet 650 mg (not administered)     Initial Impression / Assessment and Plan / ED Course  I have reviewed the triage vital signs and the nursing notes.  Pertinent labs & imaging results that were available during my care of the patient were reviewed by me and considered in my medical decision making (see chart for details).     26 year old female presents to the emergency department for complaints of back pain. Back pain is atraumatic and worse with movement. It is reproducible on palpation of the thoracic paraspinal muscles. No bony deformities, step-offs, or crepitus to the thoracic midline. Pain is not pleuritic in nature. Patient has no hypoxia. No tachycardia. Doubt PE.  Kidney stone was considered given hematuria, though renal ultrasound does not suggest hydronephrosis or presence of stones. No concern for UTI. Suspect musculoskeletal etiology.  Hematuria determined to be most likely secondary to contamination from vaginal bleeding and spotting. Patient reports being a proximally [redacted] weeks pregnant. No indication for RhoGAM. HCG also found to be 26. Given that this does not correlate with [redacted] weeks gestation, high suspicion of spontaneous abortion. Ultrasound was ordered initially, but discontinued given hCG level and likely inability to view an IUP and lack of abdominal pain complaints. The patient has been instructed to follow up with an OBGYN in 2 days to trend her hCG.  Will manage back pain with Tylenol. No red flags or signs concerning for cauda equina. Return precautions discussed and provided. Patient discharged in stable condition with no unaddressed concerns.   Final Clinical Impressions(s) / ED Diagnoses   Final diagnoses:  Strain of thoracic region, initial encounter  Pregnancy,  unspecified gestational age    [place] Prescriptions New Prescriptions   No medications on file     [employer]  Raliegh Ip 08/03/16 1610    Antony Madura, PA-C 08/03/16 [id number]    Glynn Octave, MD 08/03/16 [phone]

## 2016-07-30 ENCOUNTER — Emergency Department (HOSPITAL_COMMUNITY): Payer: 59

## 2016-07-30 LAB — I-STAT CHEM 8, ED
BUN: 10 mg/dL (ref 6–20)
CREATININE: 0.7 mg/dL (ref 0.44–1.00)
Calcium, Ion: 1.21 mmol/L (ref 1.15–1.40)
Chloride: 104 mmol/L (ref 101–111)
GLUCOSE: 104 mg/dL — AB (ref 65–99)
HEMATOCRIT: 32 % — AB (ref 36.0–46.0)
Hemoglobin: 10.9 g/dL — ABNORMAL LOW (ref 12.0–15.0)
Potassium: 3.8 mmol/L (ref 3.5–5.1)
Sodium: 140 mmol/L (ref 135–145)
TCO2: 25 mmol/L (ref 0–100)

## 2016-07-30 LAB — PREGNANCY, URINE: PREG TEST UR: POSITIVE — AB

## 2016-07-30 LAB — HCG, QUANTITATIVE, PREGNANCY: hCG, Beta Chain, Quant, S: 26 m[IU]/mL — ABNORMAL HIGH (ref <5)

## 2016-07-30 LAB — ABO/RH: ABO/RH(D): B POS

## 2016-07-30 MED ORDER — ACETAMINOPHEN 500 MG PO TABS: 1000.0000 mg | ORAL_TABLET | Freq: Three times a day (TID) | ORAL | 0 refills | Status: DC | PRN

## 2016-07-30 NOTE — ED Notes (Signed)
Pt given water with PA-C permission.

## 2016-07-30 NOTE — Discharge Instructions (Signed)
We suspect that your pain is due to muscle strain/spasm. You may take Tylenol as prescribed for this. Your pregnancy test was positive, but low given that you are approximately [redacted] weeks pregnant. We [facility] have your hCG level rechecked. It is possible that you may be miscarrying given your vaginal bleeding. Follow up with a primary care doctor to ensure resolution of back pain.

## 2016-07-31 ENCOUNTER — Inpatient Hospital Stay (HOSPITAL_COMMUNITY)
Admission: AD | Admit: 2016-07-31 | Discharge: 2016-07-31 | Disposition: A | Discharge status: Home / Self Care | Payer: 59 | Source: Ambulatory Visit | Attending: Obstetrics and Gynecology | Admitting: Obstetrics and Gynecology

## 2016-07-31 ENCOUNTER — Encounter (HOSPITAL_COMMUNITY): Payer: Self-pay | Admitting: *Deleted

## 2016-07-31 ENCOUNTER — Inpatient Hospital Stay (HOSPITAL_COMMUNITY): Payer: 59

## 2016-07-31 DIAGNOSIS — Z3A01 Less than 8 weeks gestation of pregnancy: Secondary | ICD-10-CM | POA: Insufficient documentation

## 2016-07-31 DIAGNOSIS — O209 Hemorrhage in early pregnancy, unspecified: Secondary | ICD-10-CM

## 2016-07-31 DIAGNOSIS — O3680X0 Pregnancy with inconclusive fetal viability, not applicable or unspecified: Secondary | ICD-10-CM

## 2016-07-31 LAB — URINALYSIS, ROUTINE W REFLEX MICROSCOPIC
Bilirubin Urine: NEGATIVE
Glucose, UA: NEGATIVE mg/dL
KETONES UR: NEGATIVE mg/dL
Nitrite: NEGATIVE
PROTEIN: 100 mg/dL — AB
Specific Gravity, Urine: 1.027 (ref 1.005–1.030)
pH: 7 (ref 5.0–8.0)

## 2016-07-31 LAB — CBC
HEMATOCRIT: 32.2 % — AB (ref 36.0–46.0)
HEMOGLOBIN: 10.2 g/dL — AB (ref 12.0–15.0)
MCH: 20 pg — ABNORMAL LOW (ref 26.0–34.0)
MCHC: 31.7 g/dL (ref 30.0–36.0)
MCV: 63.3 fL — ABNORMAL LOW (ref 78.0–100.0)
Platelets: 190 10*3/uL (ref 150–400)
RBC: 5.09 MIL/uL (ref 3.87–5.11)
RDW: 20 % — AB (ref 11.5–15.5)
WBC: 4.8 10*3/uL (ref 4.0–10.5)

## 2016-07-31 LAB — WET PREP, GENITAL
Clue Cells Wet Prep HPF POC: NONE SEEN
SPERM: NONE SEEN
TRICH WET PREP: NONE SEEN
YEAST WET PREP: NONE SEEN

## 2016-07-31 LAB — URINE CULTURE: Culture: 50000 — AB

## 2016-07-31 LAB — HCG, QUANTITATIVE, PREGNANCY: hCG, Beta Chain, Quant, S: 18 m[IU]/mL — ABNORMAL HIGH (ref <5)

## 2016-07-31 NOTE — MAU Note (Signed)
Provider spoke with patient about results and follow up quant. In 2 days. Provider discharged patient from triage.

## 2016-07-31 NOTE — MAU Provider Note (Signed)
History     CSN: [id number]  Arrival date and time: 07/31/16 1300   First Provider Initiated Contact with Patient 07/31/16 1558      Chief Complaint  Patient presents with  . Vaginal Bleeding   Lauren Potter is a 26 y.o. G2P1001 at [redacted]w[redacted]d by sure LMP presenting for vaginal bleeding. She began spotting about 4 days ago (one day after seen at [place] where pregnancy was diagnosed). Today the bleeding became redder and heavier though it is not as heavy as a period. It is associated with pelvic cramping. She is wearing of peri-pad. No antecedent intercourse. No orthostatic symptoms.  On 07/26/2016 she was having right-sided weakness and this has resolved. She presented to [facility] 2 days ago for back pain which has also resolved. She had a quantitative serum beta hCG of 26 at that visit.  Pregnancy is planned and desired. Blood type is B+.     OB History  Gravida Para Term Preterm AB Living  2 1 1     1   SAB TAB Ectopic Multiple Live Births        0 1    # Outcome Date GA Lbr Len/2nd Weight Sex Delivery Anes PTL Lv  2 Current           1 Term 08/09/14 [redacted]w[redacted]d 08:21 / 00:27 3.01 kg (6 lb 10.2 oz) M Vag-Spont None  LIV       Past Medical History:  Diagnosis Date  . Frequent sinus infections   . Medical history non-contributory     Past Surgical History:  Procedure Laterality Date  . APPENDECTOMY      Family History  Problem Relation Age of Onset  . Diabetes Mother   . Hypertension Father     Social History  Substance Use Topics  . Smoking status: Never Smoker  . Smokeless tobacco: Never Used  . Alcohol use No    Allergies: No Known Allergies  Prescriptions Prior to Admission  Medication Sig Dispense Refill Last Dose  . acetaminophen (TYLENOL) 500 MG tablet Take 2 tablets (1,000 mg total) by mouth every 8 (eight) hours as needed for mild pain, moderate pain or headache. 30 tablet 0     Review of Systems  Constitutional: Negative for chills and fever.    Genitourinary: Positive for pelvic pain and vaginal bleeding. Negative for flank pain, frequency, hematuria and urgency.  Neurological: Negative for dizziness and numbness.  Psychiatric/Behavioral: The patient is nervous/anxious.    Physical Exam   Blood pressure (!) 115/58, pulse 74, temperature 99.4 F (37.4 C), temperature source Oral, resp. rate 18, height 5' 7.5" (1.715 m), weight 84 kg (185 lb 1.9 oz), last menstrual period 06/25/2016, unknown if currently breastfeeding.  Physical Exam  Nursing note and vitals reviewed. Constitutional: She is oriented to person, place, and time. She appears well-developed and well-nourished. No distress.  HENT:  Head: Normocephalic.  Eyes: Pupils are equal, round, and reactive to light.  Neck: Normal range of motion.  Cardiovascular: Normal rate.   Respiratory: Effort normal.  GI: Soft. There is no tenderness.  Genitourinary:  Genitourinary Comments: NEFG Spec: small amount BRB; no active bleeding from os. Cx clean Bimanual: cx long closed; no CMT or uterine tenderness; no adnexal tenderness or masses  Musculoskeletal: Normal range of motion.  Neurological: She is alert and oriented to person, place, and time.  Psychiatric: She has a normal mood and affect. Her behavior is normal.    MAU Course  Procedures Status:  Final result  Visible to patient:  No (Not Released) Next appt:  None    Ref Range & Units 13:51 1d ago   hCG, Beta Chain, Quant, S <5 mIU/mL 18   <5 mIU/mL" class="[id number]"> 26CM    Comments:           Results for orders placed or performed during the hospital encounter of 07/31/16 (from the past 24 hour(s))  Urinalysis, Routine w reflex microscopic     Status: Abnormal   Collection Time: 07/31/16  1:43 PM  Result Value Ref Range   Color, Urine YELLOW YELLOW   APPearance HAZY (A) CLEAR   Specific Gravity, Urine 1.027 1.005 - 1.030   pH 7.0 5.0 - 8.0   Glucose, UA NEGATIVE NEGATIVE mg/dL   Hgb urine dipstick  LARGE (A) NEGATIVE   Bilirubin Urine NEGATIVE NEGATIVE   Ketones, ur NEGATIVE NEGATIVE mg/dL   Protein, ur 956100 (A) NEGATIVE mg/dL   Nitrite NEGATIVE NEGATIVE   Leukocytes, UA SMALL (A) NEGATIVE   RBC / HPF TOO NUMEROUS TO COUNT 0 - 5 RBC/hpf   WBC, UA 6-30 0 - 5 WBC/hpf   Bacteria, UA RARE (A) NONE SEEN   Squamous Epithelial / LPF 0-5 (A) NONE SEEN   Mucous PRESENT   CBC     Status: Abnormal   Collection Time: 07/31/16  1:51 PM  Result Value Ref Range   WBC 4.8 4.0 - 10.5 K/uL   RBC 5.09 3.87 - 5.11 MIL/uL   Hemoglobin 10.2 (L) 12.0 - 15.0 g/dL   HCT 21.332.2 (L) 08.636.0 - 57.846.0 %   MCV 63.3 (L) 78.0 - 100.0 fL   MCH 20.0 (L) 26.0 - 34.0 pg   MCHC 31.7 30.0 - 36.0 g/dL   RDW 46.920.0 (H) 62.911.5 - 52.815.5 %   Platelets 190 150 - 400 K/uL  hCG, quantitative, pregnancy     Status: Abnormal   Collection Time: 07/31/16  1:51 PM  Result Value Ref Range   hCG, Beta Chain, Quant, S 18 (H) <5 mIU/mL  Wet prep, genital     Status: Abnormal   Collection Time: 07/31/16  3:53 PM  Result Value Ref Range   Yeast Wet Prep HPF POC NONE SEEN NONE SEEN   Trich, Wet Prep NONE SEEN NONE SEEN   Clue Cells Wet Prep HPF POC NONE SEEN NONE SEEN   WBC, Wet Prep HPF POC MODERATE (A) NONE SEEN   Sperm NONE SEEN    GC/CT sent   MDM Slightly falling but very low quantitative beta hCG in pregnancy of unknown anatomic location. Counseled at length on possible ectopic pregnancy and its implications including life-threatenting hemorrhage, probable early pregnancy failure versus early IUP that could possibly be viable. Very desired pregnancy and she elects to return in 48 hours for another quantitative beta hCG. She is given ectopic precautions verbal and written and will return for severe abdominal pain or heavy vaginal bleeding.   Assessment and Plan  Vaginal bleeding in pregnancy, first trimester  Bleeding in early pregnancy  Pregnancy of unknown anatomic location  Allergies as of 07/31/2016   No Known Allergies       Medication List    TAKE these medications   acetaminophen 500 MG tablet Commonly known as:  TYLENOL Take 2 tablets (1,000 mg total) by mouth every 8 (eight) hours as needed for mild pain, moderate pain or headache.      Follow-up Information    Nursepractioner Mau, NP Follow up.  Danae Orleans, CNM 07/31/2016 6:15 PM

## 2016-07-31 NOTE — Discharge Instructions (Signed)
Ectopic Pregnancy °An ectopic pregnancy is when the fertilized egg attaches (implants) outside the uterus. Most ectopic pregnancies occur in one of the tubes where eggs travel from the ovary to the uterus (fallopian tubes), but the implanting can occur in other locations. In rare cases, ectopic pregnancies occur on the ovary, intestine, pelvis, abdomen, or cervix. In an ectopic pregnancy, the fertilized egg does not have the ability to develop into a normal, healthy baby. °A ruptured ectopic pregnancy is one in which tearing or bursting of a fallopian tube causes internal bleeding. Often, there is intense lower abdominal pain, and vaginal bleeding sometimes occurs. Having an ectopic pregnancy can be life-threatening. If this dangerous condition is not treated, it can lead to blood loss, shock, or even death. °What are the causes? °The most common cause of this condition is damage to one of the fallopian tubes. A fallopian tube may be narrowed or blocked, and that keeps the fertilized egg from reaching the uterus. °What increases the risk? °This condition is more likely to develop in women of childbearing age who have different levels of risk. The levels of risk can be divided into three categories. °High risk  °· You have gone through infertility treatment. °· You have had an ectopic pregnancy before. °· You have had surgery on the fallopian tubes, or another surgical procedure, such as an abortion. °· You have had surgery to have the fallopian tubes tied (tubal ligation). °· You have problems or diseases of the fallopian tubes. °· You have been exposed to diethylstilbestrol (DES). This medicine was used until 1971, and it had effects on babies whose mothers took the medicine. °· You become pregnant while using an IUD (intrauterine device) for birth control. °Moderate risk  °· You have a history of infertility. °· You have had an STI (sexually transmitted infection). °· You have a history of pelvic inflammatory  disease (PID). °· You have scarring from endometriosis. °· You have multiple sexual partners. °· You smoke. °Low risk  °· You have had pelvic surgery. °· You use vaginal douches. °· You became sexually active before age 18. °What are the signs or symptoms? °Common symptoms of this condition include normal pregnancy symptoms, such as missing a period, nausea, tiredness, abdominal pain, breast tenderness, and bleeding. However, ectopic pregnancy will have additional symptoms, such as: °· Pain with intercourse. °· Irregular vaginal bleeding or spotting. °· Cramping or pain on one side or in the lower abdomen. °· Fast heartbeat, low blood pressure, and sweating. °· Passing out while having a bowel movement. °Symptoms of a ruptured ectopic pregnancy and internal bleeding may include: °· Sudden, severe pain in the abdomen and pelvis. °· Dizziness, weakness, light-headedness, or fainting. °· Pain in the shoulder or neck area. °How is this diagnosed? °This condition is diagnosed by: °· A pelvic exam to locate pain or a mass in the abdomen. °· A pregnancy test. This blood test checks for the presence as well as the specific level of pregnancy hormone in the bloodstream. °· Ultrasound. This is performed if a pregnancy test is positive. In this test, a probe is inserted into the vagina. The probe will detect a fetus, possibly in a location other than the uterus. °· Taking a sample of uterus tissue (dilation and curettage, or D&C). °· Surgery to perform a visual exam of the inside of the abdomen using a thin, lighted tube that has a tiny camera on the end (laparoscope). °· Culdocentesis. This procedure involves inserting a needle at the   top of the vagina, behind the uterus. If blood is present in this area, it may indicate that a fallopian tube is torn. How is this treated? This condition is treated with medicine or surgery. Medicine   An injection of a medicine (methotrexate) may be given to cause the pregnancy tissue to  be absorbed. This medicine may save your fallopian tube. It may be given if:  The diagnosis is made early, with no signs of active bleeding.  The fallopian tube has not ruptured.  You are considered to be a good candidate for the medicine. Usually, pregnancy hormone blood levels are checked after methotrexate treatment. This is to be sure that the medicine is effective. It may take 4-6 weeks for the pregnancy to be absorbed. Most pregnancies will be absorbed by 3 weeks. Surgery   A laparoscope may be used to remove the pregnancy tissue.  If severe internal bleeding occurs, a larger cut (incision) may be made in the lower abdomen (laparotomy) to remove the fetus and placenta. This is done to stop the bleeding.  Part or all of the fallopian tube may be removed (salpingectomy) along with the fetus and placenta. The fallopian tube may also be repaired during the surgery.  In very rare circumstances, removal of the uterus (hysterectomy) may be required.  After surgery, pregnancy hormone testing may be done to be sure that there is no pregnancy tissue left. Whether your treatment is medicine or surgery, you may receive a Rho (D) immune globulin shot to prevent problems with any future pregnancy. This shot may be given if:  You are Rh-negative and the baby's father is Rh-positive.  You are Rh-negative and you do not know the Rh type of the baby's father. Follow these instructions at home:  Rest and limit your activity after the procedure for as long as told by your health care provider.  Until your health care provider says that it is safe:  Do not lift anything that is heavier than 10 lb (4.5 kg), or the limit that your health care provider tells you.  Avoid physical exercise and any movement that requires effort (is strenuous).  To help prevent constipation:  Eat a healthy diet that includes fruits, vegetables, and whole grains.  Drink 6-8 glasses of water per day. Get help right  away if:  You develop worsening pain that is not relieved by medicine.  You have:  A fever or chills.  Vaginal bleeding.  Redness and swelling at the incision site.  Nausea and vomiting.  You feel dizzy or weak.  You feel light-headed or you faint. This information is not intended to replace advice given to you by your health care provider. Make sure you discuss any questions you have with your health care provider. Document Released: 05/29/2004 Document Revised: 12/19/2015 Document Reviewed: 11/21/2015 Elsevier Interactive Patient Education  2017 Elsevier Inc. Vaginal Bleeding During Pregnancy, First Trimester A small amount of bleeding (spotting) from the vagina is common in early pregnancy. Sometimes the bleeding is normal and is not a problem, and sometimes it is a sign of something serious. Be sure to tell your doctor about any bleeding from your vagina right away. Follow these instructions at home:  Watch your condition for any changes.  Follow your doctor's instructions about how active you can be.  If you are on bed rest:  You may need to stay in bed and only get up to use the bathroom.  You may be allowed to do some activities.  If you need help, make plans for someone to help you.  Write down:  The number of pads you use each day.  How often you change pads.  How soaked (saturated) your pads are.  Do not use tampons.  Do not douche.  Do not have sex or orgasms until your doctor says it is okay.  If you pass any tissue from your vagina, save the tissue so you can show it to your doctor.  Only take medicines as told by your doctor.  Do not take aspirin because it can make you bleed.  Keep all follow-up visits as told by your doctor. Contact a doctor if:  You bleed from your vagina.  You have cramps.  You have labor pains.  You have a fever that does not go away after you take medicine. Get help right away if:  You have very bad cramps in  your back or belly (abdomen).  You pass large clots or tissue from your vagina.  You bleed more.  You feel light-headed or weak.  You pass out (faint).  You have chills.  You are leaking fluid or have a gush of fluid from your vagina.  You pass out while pooping (having a bowel movement). This information is not intended to replace advice given to you by your health care provider. Make sure you discuss any questions you have with your health care provider. Document Released: 09/05/2013 Document Revised: 09/27/2015 Document Reviewed: 12/27/2012 Elsevier Interactive Patient Education  2017 [employer]

## 2016-07-31 NOTE — MAU Note (Signed)
Pt started having vaginal bleeding x 2-3 days. Pt went to ER on Tuesday. Told to get hormone level check. Bleeding is heaving today. C/o abd cramping as well.

## 2016-08-01 ENCOUNTER — Ambulatory Visit (INDEPENDENT_AMBULATORY_CARE_PROVIDER_SITE_OTHER): Payer: 59 | Admitting: Physician Assistant

## 2016-08-01 ENCOUNTER — Telehealth: Payer: Self-pay

## 2016-08-01 VITALS — BP 128/83 | HR 84 | Temp 98.6°F | Resp 16 | Ht 67.0 in | Wt 181.6 lb

## 2016-08-01 DIAGNOSIS — R51 Headache: Secondary | ICD-10-CM | POA: Diagnosis not present

## 2016-08-01 DIAGNOSIS — O039 Complete or unspecified spontaneous abortion without complication: Secondary | ICD-10-CM | POA: Diagnosis not present

## 2016-08-01 DIAGNOSIS — R519 Headache, unspecified: Secondary | ICD-10-CM

## 2016-08-01 DIAGNOSIS — F4321 Adjustment disorder with depressed mood: Secondary | ICD-10-CM

## 2016-08-01 DIAGNOSIS — F4320 Adjustment disorder, unspecified: Secondary | ICD-10-CM

## 2016-08-01 LAB — POCT CBC
Granulocyte percent: 47.9 %G (ref 37–80)
HCT, POC: 33.5 % — AB (ref 37.7–47.9)
HEMOGLOBIN: 10.6 g/dL — AB (ref 12.2–16.2)
Lymph, poc: 2.9 (ref 0.6–3.4)
MCH: 20.1 pg — AB (ref 27–31.2)
MCHC: 31.7 g/dL — AB (ref 31.8–35.4)
MCV: 63.4 fL — AB (ref 80–97)
MID (cbc): 0.2 (ref 0–0.9)
MPV: 9.5 fL (ref 0–99.8)
POC GRANULOCYTE: 2.9 (ref 2–6.9)
POC LYMPH PERCENT: 49.1 %L (ref 10–50)
POC MID %: 3 % (ref 0–12)
Platelet Count, POC: 246 10*3/uL (ref 142–424)
RBC: 5.29 M/uL (ref 4.04–5.48)
RDW, POC: 20.6 %
WBC: 6 10*3/uL (ref 4.6–10.2)

## 2016-08-01 LAB — GC/CHLAMYDIA PROBE AMP (~~LOC~~) NOT AT ARMC
CHLAMYDIA, DNA PROBE: NEGATIVE
Neisseria Gonorrhea: NEGATIVE

## 2016-08-01 MED ORDER — TRAMADOL HCL 50 MG PO TABS: 50.0000 mg | ORAL_TABLET | Freq: Three times a day (TID) | ORAL | 0 refills | Status: DC | PRN

## 2016-08-01 NOTE — Patient Instructions (Addendum)
Please return to the clinic tomorrow for your labs, or if they are doing it at the [facility] hospital, then follow up with them.   I would like you to followup in 2-4 weeks with me for a physical with me.     IF you received an x-ray today, you will receive an invoice from [facility]. Please contact [facility] at [phone] with questions or concerns regarding your invoice.   IF you received labwork today, you will receive an invoice from [facility]. Please contact [facility] at [phone] with questions or concerns regarding your invoice.   Our billing staff will not be able to assist you with questions regarding bills from these companies.  You will be contacted with the lab results as soon as they are available. The fastest way to get your results is to activate your My Chart account. Instructions are located on the last page of this paperwork. If you have not heard from [place] regarding the results in 2 weeks, please contact this office.

## 2016-08-01 NOTE — Progress Notes (Signed)
ED Antimicrobial Stewardship Positive Culture Follow Up  Tanay Dory Horn is an 26 y.o. female who presented to [facility] on 07/31/2016 with a chief complaint of  Chief Complaint  Patient presents with  . Vaginal Bleeding   ? Recent Results (from the past 720 hour(s))  Urine culture     Status: Abnormal   Collection Time: 07/29/16 10:55 PM  Result Value Ref Range Status   Specimen Description URINE, RANDOM  Final   Special Requests NONE  Final   Culture (A)  Final    50,000 COLONIES/mL GROUP B STREP(S.AGALACTIAE)ISOLATED CRITICAL RESULT CALLED TO, READ BACK BY AND VERIFIED WITH: N [facility] 07/31/1333 M VESTAL    Report Status 07/31/2016 FINAL  Final  Wet prep, genital     Status: Abnormal   Collection Time: 07/31/16  3:53 PM  Result Value Ref Range Status   Yeast Wet Prep HPF POC NONE SEEN NONE SEEN Final   Trich, Wet Prep NONE SEEN NONE SEEN Final   Clue Cells Wet Prep HPF POC NONE SEEN NONE SEEN Final   WBC, Wet Prep HPF POC MODERATE (A) NONE SEEN Final   Sperm NONE SEEN  Final   ? Patient discharged originally without antimicrobial agent and treatment is now indicated ? New antibiotic prescription: Amoxicillin 500 mg PO every 8 hours for 7 days and follow up with OB ? ED Provider: Betsy Coder PA-C ? Pollyann Samples, PharmD, BCPS 08/01/2016, 10:26 AM Infectious Diseases Pharmacist Phone# [phone]

## 2016-08-01 NOTE — Progress Notes (Signed)
PRIMARY CARE AT [place], [place] [address] [phone]  Date:  08/01/2016   Name:  Lauren Potter   DOB:  February 01, 1991   MRN:  [id number]  PCP:  [name] Patient    History of Present Illness:  Lauren Potter is a 26 y.o. female patient who presents to PCP with  Chief Complaint  Patient presents with  . Headaches    x 1 wk, miscarriage x 3 days ago     Patient is here for cc of headache and to discuss miscarriage.  Patient was seen here 6 days ago for right sided weakness.  Recommended MRI, but found to have positive pregnancy test.  Of note, the right sided weakness stopped.   Patient complains of a throbbing headache that is more generalized.  She reports no nausea, visual changes, or weakness.  No dizziness. Patient is 26 year old with one 55 year old son.  She has been attempting to conceive for 3 months.  5 days later, she has complaint of vaginal bleeding, and spontaneous abortion.  She continues to have vaginal bleeding.     Patient Active Problem List   Diagnosis Date Noted  . NVD (normal vaginal delivery) 08/09/2014  . Active labor at term 08/08/2014    Past Medical History:  Diagnosis Date  . Frequent sinus infections   . Medical history non-contributory     Past Surgical History:  Procedure Laterality Date  . APPENDECTOMY      Social History  Substance Use Topics  . Smoking status: Never Smoker  . Smokeless tobacco: Never Used  . Alcohol use No    Family History  Problem Relation Age of Onset  . Diabetes Mother   . Hypertension Father     No Known Allergies  Medication list has been reviewed and updated.  Current Outpatient Prescriptions on File Prior to Visit  Medication Sig Dispense Refill  . acetaminophen (TYLENOL) 500 MG tablet Take 2 tablets (1,000 mg total) by mouth every 8 (eight) hours as needed for mild pain, moderate pain or headache. 30 tablet 0   No current facility-administered medications on file prior to  visit.     ROS ROS otherwise unremarkable unless listed above.  Physical Examination: BP 128/83   Pulse 84   Temp 98.6 F (37 C) (Oral)   Resp 16   Ht _0  (1.702 m)   Wt 181 lb 9.6 oz (82.4 kg)   LMP 06/25/2016 (Exact Date)   SpO2 99%   Breastfeeding? No   BMI 28.44 kg/m  Ideal Body Weight: Weight in (lb) to have BMI = 25: 159.3  Physical Exam  Constitutional: She is oriented to person, place, and time. She appears well-developed and well-nourished. No distress.  HENT:  Head: Normocephalic and atraumatic.  Right Ear: External ear normal.  Left Ear: External ear normal.  Nose: Nose normal.  Eyes: Conjunctivae and EOM are normal. Pupils are equal, round, and reactive to light.  Cardiovascular: Normal rate and regular rhythm.  Exam reveals no gallop and no friction rub.   No murmur heard. Pulmonary/Chest: Effort normal. No respiratory distress.  Neurological: She is alert and oriented to person, place, and time. She has normal strength. She displays no atrophy. No cranial nerve deficit. Gait normal.  Skin: She is not diaphoretic.  Psychiatric: Her behavior is normal. Judgment normal. Her affect is labile.     Results for orders placed or performed in visit on 08/01/16  POCT CBC  Result  Value Ref Range   WBC 6.0 4.6 - 10.2 K/uL   Lymph, poc 2.9 0.6 - 3.4   POC LYMPH PERCENT 49.1 10 - 50 %L   MID (cbc) 0.2 0 - 0.9   POC MID % 3.0 0 - 12 %M   POC Granulocyte 2.9 2 - 6.9   Granulocyte percent 47.9 37 - 80 %G   RBC 5.29 4.04 - 5.48 M/uL   Hemoglobin 10.6 (A) 12.2 - 16.2 g/dL   HCT, POC 33.5 (A) 37.7 - 47.9 %   MCV 63.4 (A) 80 - 97 fL   MCH, POC 20.1 (A) 27 - 31.2 pg   MCHC 31.7 (A) 31.8 - 35.4 g/dL   RDW, POC 20.6 %   Platelet Count, POC 246 142 - 424 K/uL   MPV 9.5 0 - 99.8 fL   CBC Latest Ref Rng & Units 08/01/2016 07/31/2016 07/30/2016  WBC 4.6 - 10.2 K/uL 6.0 4.8 -  Hemoglobin 12.2 - 16.2 g/dL 10.6(A) 10.2(L) 10.9(L)  Hematocrit 37.7 - 47.9 % 33.5(A) 32.2(L)  32.0(L)  Platelets 150 - 400 K/uL - 190 -     Assessment and Plan: Brocha SibanuaDondi is a 26 y.o. female who is here today for cc of headache. Advised to increase her tylenol use for every 6 hours. Given tramadol if pain does not improve. Referring to obgyn for follow up in this young female. Counseled of miscarriages She will return to [facility] hospital for repeat quant.  Advised that she can rt here if she is unable to do this there tomorrow with a labs only. Nonintractable headache, unspecified chronicity pattern, unspecified headache type - Plan: POCT CBC, CMP14+EGFR, traMADol (ULTRAM) 50 MG tablet  Miscarriage - Plan: Beta HCG, Quant  Grieving  Ivar Drape, PA-C [facility] 4/1/20187:26 AM

## 2016-08-02 ENCOUNTER — Inpatient Hospital Stay (HOSPITAL_COMMUNITY)
Admission: AD | Admit: 2016-08-02 | Discharge: 2016-08-02 | Disposition: A | Discharge status: Home / Self Care | Payer: 59 | Source: Ambulatory Visit | Attending: Obstetrics & Gynecology | Admitting: Obstetrics & Gynecology

## 2016-08-02 ENCOUNTER — Encounter: Payer: Self-pay | Admitting: Radiology

## 2016-08-02 DIAGNOSIS — O039 Complete or unspecified spontaneous abortion without complication: Secondary | ICD-10-CM | POA: Diagnosis not present

## 2016-08-02 DIAGNOSIS — Z9889 Other specified postprocedural states: Secondary | ICD-10-CM | POA: Insufficient documentation

## 2016-08-02 DIAGNOSIS — Z79899 Other long term (current) drug therapy: Secondary | ICD-10-CM | POA: Diagnosis not present

## 2016-08-02 LAB — HCG, QUANTITATIVE, PREGNANCY: hCG, Beta Chain, Quant, S: 5 m[IU]/mL — ABNORMAL HIGH (ref <5)

## 2016-08-02 MED ORDER — PRENATAL VITAMIN 27-0.8 MG PO TABS: 1.0000 | ORAL_TABLET | Freq: Every day | ORAL | 5 refills | Status: DC

## 2016-08-02 NOTE — Discharge Instructions (Signed)

## 2016-08-02 NOTE — MAU Provider Note (Signed)
Chief Complaint: Vaginal Bleeding   SUBJECTIVE HPI: Lauren Potter is a 26 y.o. G2P1001 at [redacted]w[redacted]d who presents to Maternity Admissions reporting follow up lab.  Patient here for repeat BHCG after suspected SAB. Patient is tearful as she desired pregnancy. She states she is still having heavy bleeding, slightly more than normal period, but no clots and not saturating pads every hour. Denies dizziness/lightheadedness. Answered questions to patient's satisfaction.    Past Medical History:  Diagnosis Date  . Frequent sinus infections   . Medical history non-contributory    OB History  Gravida Para Term Preterm AB Living  SAB TAB Ectopic Multiple Live Births        0 1    # Outcome Date GA Lbr Len/2nd Weight Sex Delivery Anes PTL Lv  2 Current           1 Term 08/09/14 [redacted]w[redacted]d 08:21 / 00:27 6 lb 10.2 oz (3.01 kg) M Vag-Spont None  LIV     Past Surgical History:  Procedure Laterality Date  . APPENDECTOMY     Social History   Social History  . Marital status: Married    Spouse name: N/A  . Number of children: N/A  . Years of education: N/A   Occupational History  . Not on file.   Social History Main Topics  . Smoking status: Never Smoker  . Smokeless tobacco: Never Used  . Alcohol use No  . Drug use: No  . Sexual activity: Yes    Birth control/ protection: None   Other Topics Concern  . Not on file   Social History Narrative  . No narrative on file   No current facility-administered medications on file prior to encounter.    Current Outpatient Prescriptions on File Prior to Encounter  Medication Sig Dispense Refill  . acetaminophen (TYLENOL) 500 MG tablet Take 2 tablets (1,000 mg total) by mouth every 8 (eight) hours as needed for mild pain, moderate pain or headache. 30 tablet 0   No Known Allergies  I have reviewed the past Medical Hx, Surgical Hx, Social Hx, Allergies and Medications.   REVIEW OF SYSTEMS  A comprehensive ROS was negative  except per HPI.   OBJECTIVE Patient Vitals for the past 24 hrs:  BP Temp Temp src Pulse Resp  08/02/16 1302 117/64 98.2 F (36.8 C) Oral 60 16    PHYSICAL EXAM Constitutional: Well-developed, well-nourished female in no acute distress. Tearful. Cardiovascular: normal rate Respiratory: normal rate and effort.  GI: Abd soft, non-tender, non-distended. Pos BS x 4 Neurologic: Alert and oriented x 4.    LAB RESULTS Results for orders placed or performed during the hospital encounter of 08/02/16 (from the past 24 hour(s))  hCG, quantitative, pregnancy     Status: Abnormal   Collection Time: 08/02/16  1:02 PM  Result Value Ref Range   hCG, Beta Chain, Quant, S 5 (H) <5 mIU/mL    IMAGING US Ob Comp Less 14 Wks  Result Date: 07/31/2016 CLINICAL DATA:  Bleeding for 3 days in early pregnancy, cramping since yesterday, quantitative beta HCG = 18 EXAM: OBSTETRIC <14 WK [place] AND TRANSVAGINAL OB US TECHNIQUE: Both transabdominal and transvaginal ultrasound examinations were performed for complete evaluation of the gestation as well as the maternal uterus, adnexal regions, and pelvic cul-de-sac. Transvaginal technique was performed to assess early pregnancy. COMPARISON:  None. FINDINGS: Intrauterine gestational sac: Absent Yolk sac:  N/A Embryo:  N/A Cardiac Activity: N/A Heart  Rate: N/A  bpm is sick Subchorionic hemorrhage:  N/A Maternal uterus/adnexae: Uterus normal appearance with an endometrial complex 13 mm thick. No uterine mass or fluid collection. RIGHT ovary normal size and morphology, 4.1 x 2.3 x 2.1 cm. LEFT ovary normal size and morphology 2.9 x 2.2 x 2.2 cm. No adnexal masses or free pelvic fluid. IMPRESSION: No intrauterine gestation identified. Findings are compatible with pregnancy of unknown location. Differential diagnosis includes early intrauterine pregnancy too early to visualize, spontaneous abortion, and ectopic pregnancy. Serial quantitative beta HCG and or followup ultrasound  recommended to definitively exclude ectopic pregnancy. Electronically Signed   By: Ulyses Southward M.D.   On: 07/31/2016 16:44   US Ob Transvaginal  Result Date: 07/31/2016 CLINICAL DATA:  Bleeding for 3 days in early pregnancy, cramping since yesterday, quantitative beta HCG = 18 EXAM: OBSTETRIC <14 WK [place] AND TRANSVAGINAL OB US TECHNIQUE: Both transabdominal and transvaginal ultrasound examinations were performed for complete evaluation of the gestation as well as the maternal uterus, adnexal regions, and pelvic cul-de-sac. Transvaginal technique was performed to assess early pregnancy. COMPARISON:  None. FINDINGS: Intrauterine gestational sac: Absent Yolk sac:  N/A Embryo:  N/A Cardiac Activity: N/A Heart Rate: N/A  bpm is sick Subchorionic hemorrhage:  N/A Maternal uterus/adnexae: Uterus normal appearance with an endometrial complex 13 mm thick. No uterine mass or fluid collection. RIGHT ovary normal size and morphology, 4.1 x 2.3 x 2.1 cm. LEFT ovary normal size and morphology 2.9 x 2.2 x 2.2 cm. No adnexal masses or free pelvic fluid. IMPRESSION: No intrauterine gestation identified. Findings are compatible with pregnancy of unknown location. Differential diagnosis includes early intrauterine pregnancy too early to visualize, spontaneous abortion, and ectopic pregnancy. Serial quantitative beta HCG and or followup ultrasound recommended to definitively exclude ectopic pregnancy. Electronically Signed   By: Ulyses Southward M.D.   On: 07/31/2016 16:44   US Renal  Result Date: 07/30/2016 CLINICAL DATA:  Back pain and hematuria. EXAM: RENAL / URINARY TRACT ULTRASOUND COMPLETE COMPARISON:  None. FINDINGS: Right Kidney: Length: 11.8 cm. Echogenicity within normal limits. No mass or hydronephrosis visualized. No shadowing calculi. Left Kidney: Length: 10.6 cm. Echogenicity within normal limits. No mass or hydronephrosis visualized. No shadowing calculi. Bladder: Appears normal for degree of bladder distention, only  minimally distended. IMPRESSION: Normal renal ultrasound. No hydronephrosis or sonographic evidence of renal calculi. Electronically Signed   By: Rubye Oaks M.D.   On: 07/30/2016 00:36    MAU COURSE BHCG 26>18>5 today  MDM Plan of care reviewed with patient, including labs and tests ordered and medical treatment. Patient to follow up with [facility] office in 1 week, can have last BHCG draw if provider desires, appropriately low BHCG for SAB. Discussed bleeding precautions and reasons to return to MAU (ie. Fevers/chills, abdominal tenderness).    ASSESSMENT 1. SAB (spontaneous abortion)     PLAN Discharge home in stable condition. Follow up in 1 week in office for preconception counseling and possible repeat BHCG.  Follow-up Information    [facility] for [facility]. Schedule an appointment as soon as possible for a visit in 1 week(s).   Specialty:  Obstetrics and Gynecology Why:  Follow up miscarriage, last repeat blood draw Contact information: [address] [place] [address] [phone]         Allergies as of 08/02/2016   No Known Allergies     Medication List    TAKE these medications   acetaminophen 500 MG tablet Commonly known as:  TYLENOL  Take 2 tablets (1,000 mg total) by mouth every 8 (eight) hours as needed for mild pain, moderate pain or headache.   Prenatal Vitamin 27-0.8 MG Tabs Take 1 tablet by mouth daily.   traMADol 50 MG tablet Commonly known as:  ULTRAM Take 1 tablet (50 mg total) by mouth every 8 (eight) hours as needed.        Jen Mow, DO [place] [facility] 08/02/2016 2:06 PM

## 2016-08-02 NOTE — MAU Note (Signed)
Patient presents for f/u bloodwork, denies pain, still having bleeding like a period.

## 2016-08-03 LAB — CMP14+EGFR
ALBUMIN: 4.4 g/dL (ref 3.5–5.5)
ALK PHOS: 34 IU/L — AB (ref 39–117)
ALT: 8 IU/L (ref 0–32)
AST: 15 IU/L (ref 0–40)
Albumin/Globulin Ratio: 1.5 (ref 1.2–2.2)
BUN/Creatinine Ratio: 13 (ref 9–23)
BUN: 11 mg/dL (ref 6–20)
Bilirubin Total: <0.2 mg/dL (ref 0.0–1.2)
CO2: 22 mmol/L (ref 18–29)
CREATININE: 0.85 mg/dL (ref 0.57–1.00)
Calcium: 9.4 mg/dL (ref 8.7–10.2)
Chloride: 99 mmol/L (ref 96–106)
GFR calc Af Amer: 111 mL/min/{1.73_m2} (ref >59)
GFR calc non Af Amer: 96 mL/min/{1.73_m2} (ref >59)
GLUCOSE: 87 mg/dL (ref 65–99)
Globulin, Total: 3 g/dL (ref 1.5–4.5)
Potassium: 4.1 mmol/L (ref 3.5–5.2)
Sodium: 138 mmol/L (ref 134–144)
TOTAL PROTEIN: 7.4 g/dL (ref 6.0–8.5)

## 2016-08-08 ENCOUNTER — Other Ambulatory Visit: Payer: 59

## 2016-08-08 ENCOUNTER — Other Ambulatory Visit: Payer: Self-pay | Admitting: Obstetrics & Gynecology

## 2016-08-08 ENCOUNTER — Telehealth: Payer: Self-pay | Admitting: Physician Assistant

## 2016-08-08 DIAGNOSIS — O3680X0 Pregnancy with inconclusive fetal viability, not applicable or unspecified: Secondary | ICD-10-CM

## 2016-08-08 DIAGNOSIS — O039 Complete or unspecified spontaneous abortion without complication: Secondary | ICD-10-CM

## 2016-08-08 NOTE — Telephone Encounter (Signed)
Pt has brought in paperwork in for Lauren Potter to fill out for her. I have placed in the nurse's box. Please advise pt when ready. [phone]

## 2016-08-08 NOTE — Telephone Encounter (Signed)
fyi

## 2016-08-09 ENCOUNTER — Telehealth: Payer: Self-pay | Admitting: Physician Assistant

## 2016-08-09 LAB — BETA HCG QUANT (REF LAB)

## 2016-08-09 NOTE — Telephone Encounter (Signed)
IC pt, LMOVM - form is completed and at front desk for pick up

## 2016-08-09 NOTE — Telephone Encounter (Signed)
Yes see prior note, paperwork up front, pt advised

## 2016-08-09 NOTE — Telephone Encounter (Signed)
Pt says she received a call from our office & no voicemail was left, She said it could be regarding the forms that Dr. Lenox Ponds is supposed to fill out for her to turn into her job. Please Advise.

## 2016-08-11 ENCOUNTER — Telehealth: Payer: Self-pay

## 2016-08-11 NOTE — Telephone Encounter (Signed)
Called patient- no answer or voicemail to leave a message. 

## 2016-08-11 NOTE — Telephone Encounter (Signed)
-----   Message from Adam Phenix, MD sent at 08/11/2016 10:36 AM EDT ----- HCG c/w miscarriage

## 2016-08-11 NOTE — Telephone Encounter (Signed)
Returned form to [employer]

## 2016-08-14 ENCOUNTER — Encounter: Payer: Self-pay | Admitting: General Practice

## 2016-10-31 ENCOUNTER — Ambulatory Visit (INDEPENDENT_AMBULATORY_CARE_PROVIDER_SITE_OTHER): Payer: 59 | Admitting: Obstetrics & Gynecology

## 2016-10-31 ENCOUNTER — Encounter: Payer: Self-pay | Admitting: Obstetrics & Gynecology

## 2016-10-31 ENCOUNTER — Other Ambulatory Visit (HOSPITAL_COMMUNITY)
Admission: RE | Admit: 2016-10-31 | Discharge: 2016-10-31 | Disposition: A | Discharge status: Home / Self Care | Payer: 59 | Source: Ambulatory Visit | Attending: Obstetrics & Gynecology | Admitting: Obstetrics & Gynecology

## 2016-10-31 VITALS — BP 127/61 | HR 77 | Ht 68.0 in | Wt 192.3 lb

## 2016-10-31 DIAGNOSIS — Z124 Encounter for screening for malignant neoplasm of cervix: Secondary | ICD-10-CM

## 2016-10-31 DIAGNOSIS — Z331 Pregnant state, incidental: Secondary | ICD-10-CM

## 2016-10-31 DIAGNOSIS — Z01419 Encounter for gynecological examination (general) (routine) without abnormal findings: Secondary | ICD-10-CM

## 2016-10-31 DIAGNOSIS — Z113 Encounter for screening for infections with a predominantly sexual mode of transmission: Secondary | ICD-10-CM

## 2016-10-31 DIAGNOSIS — Z8759 Personal history of other complications of pregnancy, childbirth and the puerperium: Secondary | ICD-10-CM

## 2016-10-31 LAB — POCT PREGNANCY, URINE: Preg Test, Ur: POSITIVE — AB

## 2016-10-31 NOTE — Patient Instructions (Addendum)
First Trimester of Pregnancy The first trimester of pregnancy is from week 1 until the end of week 13 (months 1 through 3). A week after a sperm fertilizes an egg, the egg will implant on the wall of the uterus. This embryo will begin to develop into a baby. Genes from you and your partner will form the baby. The female genes will determine whether the baby will be a boy or a girl. At 6-8 weeks, the eyes and face will be formed, and the heartbeat can be seen on ultrasound. At the end of 12 weeks, all the baby's organs will be formed. Now that you are pregnant, you will want to do everything you can to have a healthy baby. Two of the most important things are to get good prenatal care and to follow your health care provider's instructions. Prenatal care is all the medical care you receive before the baby's birth. This care will help prevent, find, and treat any problems during the pregnancy and childbirth. Body changes during your first trimester Your body goes through many changes during pregnancy. The changes vary from woman to woman.  You may gain or lose a couple of pounds at first.  You may feel sick to your stomach (nauseous) and you may throw up (vomit). If the vomiting is uncontrollable, call your health care provider.  You may tire easily.  You may develop headaches that can be relieved by medicines. All medicines should be approved by your health care provider.  You may urinate more often. Painful urination may mean you have a bladder infection.  You may develop heartburn as a result of your pregnancy.  You may develop constipation because certain hormones are causing the muscles that push stool through your intestines to slow down.  You may develop hemorrhoids or swollen veins (varicose veins).  Your breasts may begin to grow larger and become tender. Your nipples may stick out more, and the tissue that surrounds them (areola) may become darker.  Your gums may bleed and may be  sensitive to brushing and flossing.  Dark spots or blotches (chloasma, mask of pregnancy) may develop on your face. This will likely fade after the baby is born.  Your menstrual periods will stop.  You may have a loss of appetite.  You may develop cravings for certain kinds of food.  You may have changes in your emotions from day to day, such as being excited to be pregnant or being concerned that something may go wrong with the pregnancy and baby.  You may have more vivid and strange dreams.  You may have changes in your hair. These can include thickening of your hair, rapid growth, and changes in texture. Some women also have hair loss during or after pregnancy, or hair that feels dry or thin. Your hair will most likely return to normal after your baby is born.  What to expect at prenatal visits During a routine prenatal visit:  You will be weighed to make sure you and the baby are growing normally.  Your blood pressure will be taken.  Your abdomen will be measured to track your baby's growth.  The fetal heartbeat will be listened to between weeks 10 and 14 of your pregnancy.  Test results from any previous visits will be discussed.  Your health care provider may ask you:  How you are feeling.  If you are feeling the baby move.  If you have had any abnormal symptoms, such as leaking fluid, bleeding, severe  headaches, or abdominal cramping.  If you are using any tobacco products, including cigarettes, chewing tobacco, and electronic cigarettes.  If you have any questions.  Other tests that may be performed during your first trimester include:  Blood tests to find your blood type and to check for the presence of any previous infections. The tests will also be used to check for low iron levels (anemia) and protein on red blood cells (Rh antibodies). Depending on your risk factors, or if you previously had diabetes during pregnancy, you may have tests to check for high blood  sugar that affects pregnant women (gestational diabetes).  Urine tests to check for infections, diabetes, or protein in the urine.  An ultrasound to confirm the proper growth and development of the baby.  Fetal screens for spinal cord problems (spina bifida) and Down syndrome.  HIV (human immunodeficiency virus) testing. Routine prenatal testing includes screening for HIV, unless you choose not to have this test.  You may need other tests to make sure you and the baby are doing well.  Follow these instructions at home: Medicines  Follow your health care provider's instructions regarding medicine use. Specific medicines may be either safe or unsafe to take during pregnancy.  Take a prenatal vitamin that contains at least 600 micrograms (mcg) of folic acid.  If you develop constipation, try taking a stool softener if your health care provider approves. Eating and drinking  Eat a balanced diet that includes fresh fruits and vegetables, whole grains, good sources of protein such as meat, eggs, or tofu, and low-fat dairy. Your health care provider will help you determine the amount of weight gain that is right for you.  Avoid raw meat and uncooked cheese. These carry germs that can cause birth defects in the baby.  Eating four or five small meals rather than three large meals a day may help relieve nausea and vomiting. If you start to feel nauseous, eating a few soda crackers can be helpful. Drinking liquids between meals, instead of during meals, also seems to help ease nausea and vomiting.  Limit foods that are high in fat and processed sugars, such as fried and sweet foods.  To prevent constipation: ? Eat foods that are high in fiber, such as fresh fruits and vegetables, whole grains, and beans. ? Drink enough fluid to keep your urine clear or pale yellow. Activity  Exercise only as directed by your health care provider. Most women can continue their usual exercise routine during  pregnancy. Try to exercise for 30 minutes at least 5 days a week. Exercising will help you: ? Control your weight. ? Stay in shape. ? Be prepared for labor and delivery.  Experiencing pain or cramping in the lower abdomen or lower back is a good sign that you should stop exercising. Check with your health care provider before continuing with normal exercises.  Try to avoid standing for long periods of time. Move your legs often if you must stand in one place for a long time.  Avoid heavy lifting.  Wear low-heeled shoes and practice good posture.  You may continue to have sex unless your health care provider tells you not to. Relieving pain and discomfort  Wear a good support bra to relieve breast tenderness.  Take warm sitz baths to soothe any pain or discomfort caused by hemorrhoids. Use hemorrhoid cream if your health care provider approves.  Rest with your legs elevated if you have leg cramps or low back pain.  If you  develop varicose veins in your legs, wear support hose. Elevate your feet for 15 minutes, 3-4 times a day. Limit salt in your diet. Prenatal care  Schedule your prenatal visits by the twelfth week of pregnancy. They are usually scheduled monthly at first, then more often in the last 2 months before delivery.  Write down your questions. Take them to your prenatal visits.  Keep all your prenatal visits as told by your health care provider. This is important. Safety  Wear your seat belt at all times when driving.  Make a list of emergency phone numbers, including numbers for family, friends, the hospital, and police and fire departments. General instructions  Ask your health care provider for a referral to a local prenatal education class. Begin classes no later than the beginning of month 6 of your pregnancy.  Ask for help if you have counseling or nutritional needs during pregnancy. Your health care provider can offer advice or refer you to specialists for help  with various needs.  Do not use hot tubs, steam rooms, or saunas.  Do not douche or use tampons or scented sanitary pads.  Do not cross your legs for long periods of time.  Avoid cat litter boxes and soil used by cats. These carry germs that can cause birth defects in the baby and possibly loss of the fetus by miscarriage or stillbirth.  Avoid all smoking, herbs, alcohol, and medicines not prescribed by your health care provider. Chemicals in these products affect the formation and growth of the baby.  Do not use any products that contain nicotine or tobacco, such as cigarettes and e-cigarettes. If you need help quitting, ask your health care provider. You may receive counseling support and other resources to help you quit.  Schedule a dentist appointment. At home, brush your teeth with a soft toothbrush and be gentle when you floss. Contact a health care provider if:  You have dizziness.  You have mild pelvic cramps, pelvic pressure, or nagging pain in the abdominal area.  You have persistent nausea, vomiting, or diarrhea.  You have a bad smelling vaginal discharge.  You have pain when you urinate.  You notice increased swelling in your face, hands, legs, or ankles.  You are exposed to fifth disease or chickenpox.  You are exposed to [place] measles (rubella) and have never had it. Get help right away if:  You have a fever.  You are leaking fluid from your vagina.  You have spotting or bleeding from your vagina.  You have severe abdominal cramping or pain.  You have rapid weight gain or loss.  You vomit blood or material that looks like coffee grounds.  You develop a severe headache.  You have shortness of breath.  You have any kind of trauma, such as from a fall or a car accident. Summary  The first trimester of pregnancy is from week 1 until the end of week 13 (months 1 through 3).  Your body goes through many changes during pregnancy. The changes vary from  woman to woman.  You will have routine prenatal visits. During those visits, your health care provider will examine you, discuss any test results you may have, and talk with you about how you are feeling. This information is not intended to replace advice given to you by your health care provider. Make sure you discuss any questions you have with your health care provider. Document Released: 04/15/2001 Document Revised: 04/02/2016 Document Reviewed: 04/02/2016 Elsevier Interactive Patient Education  2017  [place] 18-39 Years, Female Preventive care refers to lifestyle choices and visits with your health care provider that can promote health and wellness. What does preventive care include?  A yearly physical exam. This is also called an annual well check.  Dental exams once or twice a year.  Routine eye exams. Ask your health care provider how often you should have your eyes checked.  Personal lifestyle choices, including: ? Daily care of your teeth and gums. ? Regular physical activity. ? Eating a healthy diet. ? Avoiding tobacco and drug use. ? Limiting alcohol use. ? Practicing safe sex. ? Taking vitamin and mineral supplements as recommended by your health care provider. What happens during an annual well check? The services and screenings done by your health care provider during your annual well check will depend on your age, overall health, lifestyle risk factors, and family history of disease. Counseling Your health care provider may ask you questions about your:  Alcohol use.  Tobacco use.  Drug use.  Emotional well-being.  Home and relationship well-being.  Sexual activity.  Eating habits.  Work and work Statistician.  Method of birth control.  Menstrual cycle.  Pregnancy history.  Screening You may have the following tests or measurements:  Height, weight, and BMI.  Diabetes screening. This is done by checking your blood  sugar (glucose) after you have not eaten for a while (fasting).  Blood pressure.  Lipid and cholesterol levels. These may be checked every 5 years starting at age 67.  Skin check.  Hepatitis C blood test.  Hepatitis B blood test.  Sexually transmitted disease (STD) testing.  BRCA-related cancer screening. This may be done if you have a family history of breast, ovarian, tubal, or peritoneal cancers.  Pelvic exam and Pap test. This may be done every 3 years starting at age 57. Starting at age 6, this may be done every 5 years if you have a Pap test in combination with an HPV test.  Discuss your test results, treatment options, and if necessary, the need for more tests with your health care provider. Vaccines Your health care provider may recommend certain vaccines, such as:  Influenza vaccine. This is recommended every year.  Tetanus, diphtheria, and acellular pertussis (Tdap, Td) vaccine. You may need a Td booster every 10 years.  Varicella vaccine. You may need this if you have not been vaccinated.  HPV vaccine. If you are 3 or younger, you may need three doses over 6 months.  Measles, mumps, and rubella (MMR) vaccine. You may need at least one dose of MMR. You may also need a second dose.  Pneumococcal 13-valent conjugate (PCV13) vaccine. You may need this if you have certain conditions and were not previously vaccinated.  Pneumococcal polysaccharide (PPSV23) vaccine. You may need one or two doses if you smoke cigarettes or if you have certain conditions.  Meningococcal vaccine. One dose is recommended if you are age 48-21 years and a first-year college student living in a residence hall, or if you have one of several medical conditions. You may also need additional booster doses.  Hepatitis A vaccine. You may need this if you have certain conditions or if you travel or work in places where you may be exposed to hepatitis A.  Hepatitis B vaccine. You may need this if you  have certain conditions or if you travel or work in places where you may be exposed to hepatitis B.  Haemophilus influenzae type b (Hib)  vaccine. You may need this if you have certain risk factors.  Talk to your health care provider about which screenings and vaccines you need and how often you need them. This information is not intended to replace advice given to you by your health care provider. Make sure you discuss any questions you have with your health care provider. Document Released: 06/17/2001 Document Revised: 01/09/2016 Document Reviewed: 02/20/2015 Elsevier Interactive Patient Education  2017 [employer].

## 2016-10-31 NOTE — Progress Notes (Signed)
GYNECOLOGY ANNUAL PREVENTATIVE CARE ENCOUNTER NOTE  Subjective:   Lauren Potter is a 26 y.o. 812P1001 female here for a routine annual gynecologic exam.  Current complaints: had miscarriage in 07/2016 (HCG 5 on 08/02/16 and was 1 on 08/08/16)), no periods since, had positive UPT at home this week.   Denies abnormal vaginal bleeding, discharge, pelvic pain, problems with intercourse or other gynecologic concerns.    Gynecologic History Patient's last menstrual period was 06/25/2016 (exact date). Contraception: none Last Pap: 2016. Results were: normal   Obstetric History OB History  Gravida Para Term Preterm AB Living  2 1 1     1   SAB TAB Ectopic Multiple Live Births        0 1    # Outcome Date GA Lbr Len/2nd Weight Sex Delivery Anes PTL Lv  2 Gravida           1 Term 08/09/14 7014w1d 08:21 / 00:27 6 lb 10.2 oz (3.01 kg) M Vag-Spont None  LIV      Past Medical History:  Diagnosis Date  . Frequent sinus infections   . Medical history non-contributory     Past Surgical History:  Procedure Laterality Date  . APPENDECTOMY      Current Outpatient Prescriptions on File Prior to Visit  Medication Sig Dispense Refill  . acetaminophen (TYLENOL) 500 MG tablet Take 2 tablets (1,000 mg total) by mouth every 8 (eight) hours as needed for mild pain, moderate pain or headache. 30 tablet 0  . Prenatal Vit-Fe Fumarate-FA (PRENATAL VITAMIN) 27-0.8 MG TABS Take 1 tablet by mouth daily. 90 tablet 5  . traMADol (ULTRAM) 50 MG tablet Take 1 tablet (50 mg total) by mouth every 8 (eight) hours as needed. (Patient not taking: Reported on 10/31/2016) 30 tablet 0   No current facility-administered medications on file prior to visit.     No Known Allergies  Social History   Social History  . Marital status: Married    Spouse name: N/A  . Number of children: N/A  . Years of education: N/A   Occupational History  . Not on file.   Social History Main Topics  . Smoking status: Never  Smoker  . Smokeless tobacco: Never Used  . Alcohol use No  . Drug use: No  . Sexual activity: Yes    Birth control/ protection: None   Other Topics Concern  . Not on file   Social History Narrative  . No narrative on file    Family History  Problem Relation Age of Onset  . Diabetes Mother   . Hypertension Father     The following portions of the patient's history were reviewed and updated as appropriate: allergies, current medications, past family history, past medical history, past social history, past surgical history and problem list.  Review of Systems Pertinent items noted in HPI and remainder of comprehensive ROS otherwise negative.   Objective:  BP 127/61   Pulse 77   Ht 5\' 8"  (1.727 m)   Wt 192 lb 4.8 oz (87.2 kg)   LMP 06/25/2016 (Exact Date)   Breastfeeding? Unknown   BMI 29.24 kg/m  CONSTITUTIONAL: Well-developed, well-nourished female in no acute distress.  HENT:  Normocephalic, atraumatic, External right and left ear normal. Oropharynx is clear and moist EYES: Conjunctivae and EOM are normal. Pupils are equal, round, and reactive to light. No scleral icterus.  NECK: Normal range of motion, supple, no masses.  Normal thyroid.  SKIN: Skin is warm and dry.  No rash noted. Not diaphoretic. No erythema. No pallor. NEUROLOGIC: Alert and oriented to person, place, and time. Normal reflexes, muscle tone coordination. No cranial nerve deficit noted. PSYCHIATRIC: Normal mood and affect. Normal behavior. Normal judgment and thought content. CARDIOVASCULAR: Normal heart rate noted, regular rhythm RESPIRATORY: Clear to auscultation bilaterally. Effort and breath sounds normal, no problems with respiration noted. BREASTS: Symmetric in size. No masses, skin changes, nipple drainage, or lymphadenopathy. ABDOMEN: Soft, normal bowel sounds, no distention noted.  No tenderness, rebound or guarding.  PELVIC: Normal appearing external genitalia; normal appearing vaginal mucosa and  cervix.  No abnormal discharge noted.  Pap smear obtained with some spotting afterwards. It was a tough exam due to patient's voluntary guarding and discomfort; was unable to ascertain uterine size.  No CMT.  MUSCULOSKELETAL: Normal range of motion. No tenderness.  No cyanosis, clubbing, or edema.  2+ distal pulses.    Assessment and Plan:  1. Encounter for gynecological examination with Papanicolaou smear of cervix - Cytology - PAP; will follow up results of pap smear and manage accordingly.  2. Pregnancy, incidental 3. Miscarriage within last 3 months  Will obtain viability scan and follow accordingly. - US OB Transvaginal; Future - US OB Comp Less 14 Wks; Future Told to continue prenatal vitamins and avoid teratogens Bleeding and pain precautions reviewed. Will return for initial obstetric visit soon; pending results of ultrasound. Please refer to After Visit Summary for other counseling recommendations.    Jaynie Collins, MD, FACOG Attending Obstetrician & Gynecologist, [facility]

## 2016-11-04 ENCOUNTER — Telehealth: Payer: Self-pay | Admitting: Emergency Medicine

## 2016-11-04 LAB — CYTOLOGY - PAP
Chlamydia: NEGATIVE
Diagnosis: NEGATIVE
NEISSERIA GONORRHEA: NEGATIVE

## 2016-11-04 NOTE — Telephone Encounter (Signed)
LOST TO FOLLOWUP 

## 2016-11-07 ENCOUNTER — Ambulatory Visit (HOSPITAL_COMMUNITY)
Admission: RE | Admit: 2016-11-07 | Discharge: 2016-11-07 | Disposition: A | Discharge status: Home / Self Care | Payer: 59 | Source: Ambulatory Visit | Attending: Obstetrics & Gynecology | Admitting: Obstetrics & Gynecology

## 2016-11-07 DIAGNOSIS — Z3A13 13 weeks gestation of pregnancy: Secondary | ICD-10-CM | POA: Insufficient documentation

## 2016-11-07 DIAGNOSIS — Z8759 Personal history of other complications of pregnancy, childbirth and the puerperium: Secondary | ICD-10-CM | POA: Diagnosis present

## 2016-11-07 DIAGNOSIS — Z331 Pregnant state, incidental: Secondary | ICD-10-CM | POA: Diagnosis not present

## 2016-12-29 ENCOUNTER — Encounter: Payer: 59 | Admitting: Advanced Practice Midwife

## 2016-12-30 ENCOUNTER — Other Ambulatory Visit (HOSPITAL_COMMUNITY)
Admission: RE | Admit: 2016-12-30 | Discharge: 2016-12-30 | Disposition: A | Discharge status: Home / Self Care | Payer: Medicaid Other | Source: Ambulatory Visit | Attending: Obstetrics and Gynecology | Admitting: Obstetrics and Gynecology

## 2016-12-30 ENCOUNTER — Ambulatory Visit (INDEPENDENT_AMBULATORY_CARE_PROVIDER_SITE_OTHER): Payer: Medicaid Other | Admitting: Obstetrics and Gynecology

## 2016-12-30 ENCOUNTER — Encounter: Payer: Self-pay | Admitting: Obstetrics and Gynecology

## 2016-12-30 DIAGNOSIS — Z3482 Encounter for supervision of other normal pregnancy, second trimester: Secondary | ICD-10-CM | POA: Insufficient documentation

## 2016-12-30 DIAGNOSIS — Z3490 Encounter for supervision of normal pregnancy, unspecified, unspecified trimester: Secondary | ICD-10-CM | POA: Insufficient documentation

## 2016-12-30 DIAGNOSIS — B951 Streptococcus, group B, as the cause of diseases classified elsewhere: Secondary | ICD-10-CM | POA: Insufficient documentation

## 2016-12-30 DIAGNOSIS — Z8619 Personal history of other infectious and parasitic diseases: Secondary | ICD-10-CM

## 2016-12-30 DIAGNOSIS — O0932 Supervision of pregnancy with insufficient antenatal care, second trimester: Secondary | ICD-10-CM

## 2016-12-30 DIAGNOSIS — O0930 Supervision of pregnancy with insufficient antenatal care, unspecified trimester: Secondary | ICD-10-CM

## 2016-12-30 NOTE — Progress Notes (Signed)
Subjective:  Lauren Potter is a 26 y.o. G3P1011 at [redacted]w[redacted]d being seen today for first OB visit. EDD by 13 week U/S. H/O GBS. H/O first trimester SAB. No chronic medical problems or medications.   She is currently monitored for the following issues for this low-risk pregnancy and has Encounter for supervision of normal pregnancy, unspecified, unspecified trimester; Late prenatal care; and History of group B Streptococcus (GBS) infection on her problem list.  Patient reports no complaints.  Contractions: Irritability. Vag. Bleeding: None.  Movement: Present. Denies leaking of fluid.   The following portions of the patient's history were reviewed and updated as appropriate: allergies, current medications, past family history, past medical history, past social history, past surgical history and problem list. Problem list updated.  Objective:   Vitals:   12/30/16 1414  BP: 124/75  Pulse: 78  Weight: 200 lb (90.7 kg)    Fetal Status: Fetal Heart Rate (bpm): 155   Movement: Present     General:  Alert, oriented and cooperative. Patient is in no acute distress.  Skin: Skin is warm and dry. No rash noted.   Cardiovascular: Normal heart rate noted  Respiratory: Normal respiratory effort, no problems with respiration noted  Abdomen: Soft, gravid, appropriate for gestational age. Pain/Pressure: Absent     Pelvic:  Cervical exam deferred        Extremities: Normal range of motion.  Edema: Mild pitting, slight indentation  Mental Status: Normal mood and affect. Normal behavior. Normal judgment and thought content.   Urinalysis:      Assessment and Plan:  Pregnancy: G3P1011 at [redacted]w[redacted]d  1. Encounter for supervision of normal pregnancy, antepartum, unspecified gravidity Prenatal care and labs reviewed with pt - Culture, OB Urine - Cystic Fibrosis Mutation 97 - Hemoglobinopathy evaluation - Obstetric Panel, Including HIV - Varicella zoster antibody, IgG - [place] MFM OB COMP + 14 WK; Future -  Urine cytology ancillary only  2. Late prenatal care   3. History of group B Streptococcus (GBS) infection Tx while in labor  Preterm labor symptoms and general obstetric precautions including but not limited to vaginal bleeding, contractions, leaking of fluid and fetal movement were reviewed in detail with the patient. Please refer to After Visit Summary for other counseling recommendations.  Return in about 4 weeks (around 01/27/2017) for OB visit.   Hermina Staggers, MD

## 2016-12-30 NOTE — Patient Instructions (Signed)

## 2016-12-30 NOTE — Addendum Note (Signed)
Addended by: Dalphine Handing on: 12/30/2016 03:00 PM   Modules accepted: Orders

## 2016-12-31 LAB — OBSTETRIC PANEL, INCLUDING HIV
ANTIBODY SCREEN: NEGATIVE
BASOS ABS: 0 10*3/uL (ref 0.0–0.2)
Basos: 0 %
EOS (ABSOLUTE): 0.1 10*3/uL (ref 0.0–0.4)
Eos: 1 %
HIV SCREEN 4TH GENERATION: NONREACTIVE
Hematocrit: 34.5 % (ref 34.0–46.6)
Hemoglobin: 11.3 g/dL (ref 11.1–15.9)
Hepatitis B Surface Ag: NEGATIVE
Immature Grans (Abs): 0 10*3/uL (ref 0.0–0.1)
Immature Granulocytes: 1 %
LYMPHS ABS: 1.8 10*3/uL (ref 0.7–3.1)
Lymphs: 30 %
MCH: 23.1 pg — AB (ref 26.6–33.0)
MCHC: 32.8 g/dL (ref 31.5–35.7)
MCV: 71 fL — AB (ref 79–97)
MONOS ABS: 0.3 10*3/uL (ref 0.1–0.9)
Monocytes: 6 %
NEUTROS ABS: 3.6 10*3/uL (ref 1.4–7.0)
Neutrophils: 62 %
PLATELETS: 165 10*3/uL (ref 150–379)
RBC: 4.89 x10E6/uL (ref 3.77–5.28)
RDW: 17.3 % — AB (ref 12.3–15.4)
RPR Ser Ql: NONREACTIVE
Rh Factor: POSITIVE
Rubella Antibodies, IGG: 4.69 index (ref >0.99)
WBC: 5.8 10*3/uL (ref 3.4–10.8)

## 2016-12-31 LAB — URINE CYTOLOGY ANCILLARY ONLY
CHLAMYDIA, DNA PROBE: NEGATIVE
Neisseria Gonorrhea: NEGATIVE
Trichomonas: NEGATIVE

## 2016-12-31 LAB — HEMOGLOBINOPATHY EVALUATION
HEMOGLOBIN F QUANTITATION: 0 % (ref 0.0–2.0)
HGB A: 97.3 % (ref 96.4–98.8)
HGB C: 0 %
HGB S: 0 %
HGB VARIANT: 0 %
Hemoglobin A2 Quantitation: 2.7 % (ref 1.8–3.2)

## 2016-12-31 LAB — VARICELLA ZOSTER ANTIBODY, IGG

## 2017-01-01 LAB — CULTURE, OB URINE

## 2017-01-01 LAB — URINE CULTURE, OB REFLEX: ORGANISM ID, BACTERIA: NO GROWTH

## 2017-01-02 LAB — AFP TETRA
DIA Mom Value: 0.36
DIA VALUE (EIA): 64.34 pg/mL
DSR (BY AGE) 1 IN: 1008
DSR (SECOND TRIMESTER) 1 IN: 10000
Gestational Age: 20.7 WEEKS
MSAFP MOM: 1.32
MSAFP: 74.7 ng/mL
MSHCG MOM: 0.67
MSHCG: 12865 m[IU]/mL
Maternal Age At EDD: 25.5 yr
Osb Risk: 9058
TEST RESULTS AFP: NEGATIVE
WEIGHT: 200 [lb_av]
uE3 Mom: 1.51
uE3 Value: 2.77 ng/mL

## 2017-01-02 LAB — URINE CYTOLOGY ANCILLARY ONLY
BACTERIAL VAGINITIS: POSITIVE — AB
Candida vaginitis: NEGATIVE

## 2017-01-07 LAB — CYSTIC FIBROSIS MUTATION 97: GENE DIS ANAL CARRIER INTERP BLD/T-IMP: NOT DETECTED

## 2017-01-12 ENCOUNTER — Other Ambulatory Visit: Payer: Self-pay | Admitting: Obstetrics and Gynecology

## 2017-01-12 ENCOUNTER — Ambulatory Visit (HOSPITAL_COMMUNITY)
Admission: RE | Admit: 2017-01-12 | Discharge: 2017-01-12 | Disposition: A | Discharge status: Home / Self Care | Payer: Medicaid Other | Source: Ambulatory Visit | Attending: Obstetrics and Gynecology | Admitting: Obstetrics and Gynecology

## 2017-01-12 DIAGNOSIS — Z3492 Encounter for supervision of normal pregnancy, unspecified, second trimester: Secondary | ICD-10-CM | POA: Diagnosis not present

## 2017-01-12 DIAGNOSIS — Z3A22 22 weeks gestation of pregnancy: Secondary | ICD-10-CM

## 2017-01-12 DIAGNOSIS — Z3689 Encounter for other specified antenatal screening: Secondary | ICD-10-CM

## 2017-01-12 DIAGNOSIS — Z3490 Encounter for supervision of normal pregnancy, unspecified, unspecified trimester: Secondary | ICD-10-CM

## 2017-01-27 ENCOUNTER — Ambulatory Visit (INDEPENDENT_AMBULATORY_CARE_PROVIDER_SITE_OTHER): Payer: Medicaid Other | Admitting: Obstetrics and Gynecology

## 2017-01-27 VITALS — BP 127/82 | HR 80 | Wt 206.0 lb

## 2017-01-27 DIAGNOSIS — Z3490 Encounter for supervision of normal pregnancy, unspecified, unspecified trimester: Secondary | ICD-10-CM

## 2017-01-27 DIAGNOSIS — Z8619 Personal history of other infectious and parasitic diseases: Secondary | ICD-10-CM

## 2017-01-27 DIAGNOSIS — Z3482 Encounter for supervision of other normal pregnancy, second trimester: Secondary | ICD-10-CM

## 2017-01-27 MED ORDER — METRONIDAZOLE 500 MG PO TABS: 500.0000 mg | ORAL_TABLET | Freq: Two times a day (BID) | ORAL | 0 refills | Status: DC

## 2017-01-27 NOTE — Progress Notes (Signed)
Subjective:  Lauren Potter is a 26 y.o. G3P1011 at [redacted]w[redacted]d being seen today for ongoing prenatal care.  She is currently monitored for the following issues for this low-risk pregnancy and has Encounter for supervision of normal pregnancy, unspecified, unspecified trimester; Late prenatal care; and History of group B Streptococcus (GBS) infection on her problem list.  Patient reports no complaints.  Contractions: Irregular. Vag. Bleeding: None.  Movement: Present. Denies leaking of fluid.   The following portions of the patient's history were reviewed and updated as appropriate: allergies, current medications, past family history, past medical history, past social history, past surgical history and problem list. Problem list updated.  Objective:   Vitals:   01/27/17 1627  BP: 127/82  Pulse: 80  Weight: 206 lb (93.4 kg)    Fetal Status: Fetal Heart Rate (bpm): 141   Movement: Present     General:  Alert, oriented and cooperative. Patient is in no acute distress.  Skin: Skin is warm and dry. No rash noted.   Cardiovascular: Normal heart rate noted  Respiratory: Normal respiratory effort, no problems with respiration noted  Abdomen: Soft, gravid, appropriate for gestational age. Pain/Pressure: Present     Pelvic:  Cervical exam deferred        Extremities: Normal range of motion.  Edema: Mild pitting, slight indentation  Mental Status: Normal mood and affect. Normal behavior. Normal judgment and thought content.   Urinalysis:      Assessment and Plan:  Pregnancy: G3P1011 at [redacted]w[redacted]d  1. Encounter for supervision of normal pregnancy, antepartum, unspecified gravidity Stable Glucola and Flu vaccine next visit  2. History of group B Streptococcus (GBS) infection Tx in labor  Preterm labor symptoms and general obstetric precautions including but not limited to vaginal bleeding, contractions, leaking of fluid and fetal movement were reviewed in detail with the patient. Please refer to  After Visit Summary for other counseling recommendations.  Return in about 4 weeks (around 02/24/2017) for OB visit.   Hermina Staggers, MD

## 2017-02-25 ENCOUNTER — Ambulatory Visit (INDEPENDENT_AMBULATORY_CARE_PROVIDER_SITE_OTHER): Payer: 59 | Admitting: Certified Nurse Midwife

## 2017-02-25 ENCOUNTER — Other Ambulatory Visit: Payer: Medicaid Other

## 2017-02-25 VITALS — BP 122/76 | HR 82 | Wt 207.6 lb

## 2017-02-25 DIAGNOSIS — B951 Streptococcus, group B, as the cause of diseases classified elsewhere: Secondary | ICD-10-CM

## 2017-02-25 DIAGNOSIS — O0930 Supervision of pregnancy with insufficient antenatal care, unspecified trimester: Secondary | ICD-10-CM

## 2017-02-25 DIAGNOSIS — B9689 Other specified bacterial agents as the cause of diseases classified elsewhere: Secondary | ICD-10-CM

## 2017-02-25 DIAGNOSIS — O0933 Supervision of pregnancy with insufficient antenatal care, third trimester: Secondary | ICD-10-CM

## 2017-02-25 DIAGNOSIS — Z23 Encounter for immunization: Secondary | ICD-10-CM

## 2017-02-25 DIAGNOSIS — N760 Acute vaginitis: Secondary | ICD-10-CM

## 2017-02-25 DIAGNOSIS — Z3490 Encounter for supervision of normal pregnancy, unspecified, unspecified trimester: Secondary | ICD-10-CM

## 2017-02-25 DIAGNOSIS — Z3483 Encounter for supervision of other normal pregnancy, third trimester: Secondary | ICD-10-CM

## 2017-02-25 MED ORDER — SECNIDAZOLE 2 G PO PACK: 1.0000 | PACK | Freq: Once | ORAL | 0 refills | Status: AC

## 2017-02-25 NOTE — Progress Notes (Signed)
   PRENATAL VISIT NOTE  Subjective:  Lauren Potter is a 26 y.o. G3P1011 at 2565w6d being seen today for ongoing prenatal care.  She is currently monitored for the following issues for this low-risk pregnancy and has Encounter for supervision of normal pregnancy, unspecified, unspecified trimester; Late prenatal care; and Positive GBS test on her problem list.  Patient reports no complaints.  Contractions: Irritability. Vag. Bleeding: None.  Movement: Present. Denies leaking of fluid.   The following portions of the patient's history were reviewed and updated as appropriate: allergies, current medications, past family history, past medical history, past social history, past surgical history and problem list. Problem list updated.  Objective:   Vitals:   02/25/17 0842  BP: 122/76  Pulse: 82  Weight: 207 lb 9.6 oz (94.2 kg)    Fetal Status: Fetal Heart Rate (bpm): 152; doppler Fundal Height: 28 cm Movement: Present     General:  Alert, oriented and cooperative. Patient is in no acute distress.  Skin: Skin is warm and dry. No rash noted.   Cardiovascular: Normal heart rate noted  Respiratory: Normal respiratory effort, no problems with respiration noted  Abdomen: Soft, gravid, appropriate for gestational age.  Pain/Pressure: Present     Pelvic: Cervical exam deferred        Extremities: Normal range of motion.  Edema: Trace  Mental Status:  Normal mood and affect. Normal behavior. Normal judgment and thought content.   Assessment and Plan:  Pregnancy: G3P1011 at 3065w6d  1. Encounter for supervision of normal pregnancy, antepartum, unspecified gravidity     Doing well - Glucose Tolerance, 2 Hours w/1 Hour - RPR - HIV antibody (with reflex) - CBC - Flu Vaccine QUAD 36+ mos IM (Fluarix, Quad PF)  2. Positive GBS test     PCN for labor/delivery  3. Late prenatal care     @20  weeks  4. BV (bacterial vaginosis)     Failed Flagyl - Secnidazole (SOLOSEC) 2 g PACK; Take 1  Package by mouth once.  Dispense: 1 each; Refill: 0  Preterm labor symptoms and general obstetric precautions including but not limited to vaginal bleeding, contractions, leaking of fluid and fetal movement were reviewed in detail with the patient. Please refer to After Visit Summary for other counseling recommendations.  Return in about 2 weeks (around 03/11/2017) for ROB.   Roe Coombsachelle A Pedro Whiters, CNM

## 2017-02-25 NOTE — Progress Notes (Signed)
Patient reports good fetal movement with pressure and some uterine irritability.

## 2017-02-26 ENCOUNTER — Other Ambulatory Visit: Payer: Self-pay | Admitting: Certified Nurse Midwife

## 2017-02-26 DIAGNOSIS — Z3480 Encounter for supervision of other normal pregnancy, unspecified trimester: Secondary | ICD-10-CM

## 2017-02-26 LAB — CBC
HEMOGLOBIN: 10.9 g/dL — AB (ref 11.1–15.9)
Hematocrit: 35.4 % (ref 34.0–46.6)
MCH: 22.7 pg — AB (ref 26.6–33.0)
MCHC: 30.8 g/dL — ABNORMAL LOW (ref 31.5–35.7)
MCV: 74 fL — AB (ref 79–97)
Platelets: 175 10*3/uL (ref 150–379)
RBC: 4.8 x10E6/uL (ref 3.77–5.28)
RDW: 16.3 % — ABNORMAL HIGH (ref 12.3–15.4)
WBC: 5.9 10*3/uL (ref 3.4–10.8)

## 2017-02-26 LAB — HIV ANTIBODY (ROUTINE TESTING W REFLEX): HIV Screen 4th Generation wRfx: NONREACTIVE

## 2017-02-26 LAB — GLUCOSE TOLERANCE, 2 HOURS W/ 1HR
Glucose, 1 hour: 94 mg/dL (ref 65–179)
Glucose, 2 hour: 91 mg/dL (ref 65–152)
Glucose, Fasting: 85 mg/dL (ref 65–91)

## 2017-02-26 LAB — RPR: RPR Ser Ql: NONREACTIVE

## 2017-02-26 MED ORDER — CITRANATAL BLOOM 90-1 MG PO TABS: 1.0000 | ORAL_TABLET | Freq: Every day | ORAL | 12 refills | Status: DC

## 2017-02-26 MED ORDER — PRENATE PIXIE 10-0.6-0.4-200 MG PO CAPS: 1.0000 | ORAL_CAPSULE | Freq: Every day | ORAL | 12 refills | Status: DC

## 2017-03-11 ENCOUNTER — Encounter: Payer: Self-pay | Admitting: *Deleted

## 2017-03-11 ENCOUNTER — Ambulatory Visit (INDEPENDENT_AMBULATORY_CARE_PROVIDER_SITE_OTHER): Payer: Medicaid Other | Admitting: Certified Nurse Midwife

## 2017-03-11 DIAGNOSIS — Z3483 Encounter for supervision of other normal pregnancy, third trimester: Secondary | ICD-10-CM

## 2017-03-11 DIAGNOSIS — Z3480 Encounter for supervision of other normal pregnancy, unspecified trimester: Secondary | ICD-10-CM

## 2017-03-11 NOTE — Progress Notes (Signed)
Patient reports good fetal movement with contractions that come and go, denies pain.

## 2017-03-11 NOTE — Progress Notes (Signed)
   PRENATAL VISIT NOTE  Subjective:  Lauren Potter is a 26 y.o. G3P1011 at 8373w6d being seen today for ongoing prenatal care.  She is currently monitored for the following issues for this low-risk pregnancy and has Encounter for supervision of normal pregnancy, unspecified, unspecified trimester; Late prenatal care; and Positive GBS test on their problem list.  Patient reports no complaints.  Contractions: Irregular. Vag. Bleeding: None.  Movement: Present. Denies leaking of fluid.   The following portions of the patient's history were reviewed and updated as appropriate: allergies, current medications, past family history, past medical history, past social history, past surgical history and problem list. Problem list updated.  Objective:   Vitals:   03/11/17 1603  BP: 119/74  Pulse: 79  Weight: 208 lb 6.4 oz (94.5 kg)    Fetal Status: Fetal Heart Rate (bpm): 148; doppler Fundal Height: 30 cm Movement: Present     General:  Alert, oriented and cooperative. Patient is in no acute distress.  Skin: Skin is warm and dry. No rash noted.   Cardiovascular: Normal heart rate noted  Respiratory: Normal respiratory effort, no problems with respiration noted  Abdomen: Soft, gravid, appropriate for gestational age.  Pain/Pressure: Present     Pelvic: Cervical exam deferred        Extremities: Normal range of motion.  Edema: Trace  Mental Status:  Normal mood and affect. Normal behavior. Normal judgment and thought content.   Assessment and Plan:  Pregnancy: G3P1011 at 2473w6d  1. Supervision of other normal pregnancy, antepartum     Doing well.  FMLA paperwork completed for intermittent leave from work: patient does not have any more PTO time left to take for prenatal visits.     Preterm labor symptoms and general obstetric precautions including but not limited to vaginal bleeding, contractions, leaking of fluid and fetal movement were reviewed in detail with the patient. Please refer to  After Visit Summary for other counseling recommendations.  Return in about 2 weeks (around 03/25/2017) for ROB.   Roe Coombsachelle A Anjela Cassara, CNM

## 2017-03-12 ENCOUNTER — Telehealth: Payer: Self-pay | Admitting: *Deleted

## 2017-03-12 NOTE — Telephone Encounter (Signed)
Call placed to pt to make her aware that Leave paperwork has been completed, will be faxed and left at front desk for her to pick up.

## 2017-03-24 ENCOUNTER — Encounter: Payer: Self-pay | Admitting: Certified Nurse Midwife

## 2017-03-24 ENCOUNTER — Ambulatory Visit (INDEPENDENT_AMBULATORY_CARE_PROVIDER_SITE_OTHER): Payer: Medicaid Other | Admitting: Certified Nurse Midwife

## 2017-03-24 ENCOUNTER — Encounter: Payer: Medicaid Other | Admitting: Certified Nurse Midwife

## 2017-03-24 ENCOUNTER — Other Ambulatory Visit: Payer: Self-pay

## 2017-03-24 VITALS — BP 122/69 | HR 71 | Wt 209.5 lb

## 2017-03-24 DIAGNOSIS — Z3483 Encounter for supervision of other normal pregnancy, third trimester: Secondary | ICD-10-CM

## 2017-03-24 DIAGNOSIS — B951 Streptococcus, group B, as the cause of diseases classified elsewhere: Secondary | ICD-10-CM

## 2017-03-24 DIAGNOSIS — Z3480 Encounter for supervision of other normal pregnancy, unspecified trimester: Secondary | ICD-10-CM

## 2017-03-24 NOTE — Progress Notes (Signed)
   PRENATAL VISIT NOTE  Subjective:  Lauren Potter is a 26 y.o. G3P1011 at 493w5d being seen today for ongoing prenatal care.  She is currently monitored for the following issues for this low-risk pregnancy and has Encounter for supervision of normal pregnancy, unspecified, unspecified trimester; Late prenatal care; and Positive GBS test on their problem list.  Patient reports no complaints.  Contractions: Irregular. Vag. Bleeding: None.  Movement: Present. Denies leaking of fluid.   The following portions of the patient's history were reviewed and updated as appropriate: allergies, current medications, past family history, past medical history, past social history, past surgical history and problem list. Problem list updated.  Objective:   Vitals:   03/24/17 1548  BP: 122/69  Pulse: 71  Weight: 209 lb 8 oz (95 kg)    Fetal Status: Fetal Heart Rate (bpm): 135; doppler Fundal Height: 33 cm Movement: Present     General:  Alert, oriented and cooperative. Patient is in no acute distress.  Skin: Skin is warm and dry. No rash noted.   Cardiovascular: Normal heart rate noted  Respiratory: Normal respiratory effort, no problems with respiration noted  Abdomen: Soft, gravid, appropriate for gestational age.  Pain/Pressure: Present     Pelvic: Cervical exam deferred        Extremities: Normal range of motion.  Edema: Trace  Mental Status:  Normal mood and affect. Normal behavior. Normal judgment and thought content.   Assessment and Plan:  Pregnancy: G3P1011 at 283w5d  1. Supervision of other normal pregnancy, antepartum     Doing well  2. Positive GBS test     PCN for labor/delivery  Preterm labor symptoms and general obstetric precautions including but not limited to vaginal bleeding, contractions, leaking of fluid and fetal movement were reviewed in detail with the patient. Please refer to After Visit Summary for other counseling recommendations.  Return in about 2 weeks  (around 04/07/2017) for ROB.   Roe Coombsachelle A Afshin Chrystal, CNM

## 2017-03-24 NOTE — Progress Notes (Signed)
Not taking Iron pills because of the cost.

## 2017-04-07 ENCOUNTER — Encounter: Payer: Self-pay | Admitting: Certified Nurse Midwife

## 2017-04-07 ENCOUNTER — Ambulatory Visit (INDEPENDENT_AMBULATORY_CARE_PROVIDER_SITE_OTHER): Payer: Medicaid Other | Admitting: Certified Nurse Midwife

## 2017-04-07 VITALS — BP 124/77 | HR 82 | Wt 209.2 lb

## 2017-04-07 DIAGNOSIS — Z3483 Encounter for supervision of other normal pregnancy, third trimester: Secondary | ICD-10-CM

## 2017-04-07 DIAGNOSIS — Z3480 Encounter for supervision of other normal pregnancy, unspecified trimester: Secondary | ICD-10-CM

## 2017-04-07 DIAGNOSIS — B951 Streptococcus, group B, as the cause of diseases classified elsewhere: Secondary | ICD-10-CM

## 2017-04-07 NOTE — Progress Notes (Signed)
   PRENATAL VISIT NOTE  Subjective:  Lauren Potter is a 26 y.o. G3P1011 at 5013w5d being seen today for ongoing prenatal care.  She is currently monitored for the following issues for this low-risk pregnancy and has Encounter for supervision of normal pregnancy, unspecified, unspecified trimester; Late prenatal care; and Positive GBS test on their problem list.  Patient reports no bleeding, no leaking, occasional contractions and round ligament type pain.  Contractions: Irregular. Vag. Bleeding: None.  Movement: Present. Denies leaking of fluid.   The following portions of the patient's history were reviewed and updated as appropriate: allergies, current medications, past family history, past medical history, past social history, past surgical history and problem list. Problem list updated.  Objective:   Vitals:   04/07/17 1318  BP: 124/77  Pulse: 82  Weight: 209 lb 3.2 oz (94.9 kg)    Fetal Status: Fetal Heart Rate (bpm): 140; doppler Fundal Height: 34 cm Movement: Present     General:  Alert, oriented and cooperative. Patient is in no acute distress.  Skin: Skin is warm and dry. No rash noted.   Cardiovascular: Normal heart rate noted  Respiratory: Normal respiratory effort, no problems with respiration noted  Abdomen: Soft, gravid, appropriate for gestational age.  Pain/Pressure: Present     Pelvic: Cervical exam deferred        Extremities: Normal range of motion.  Edema: Trace  Mental Status:  Normal mood and affect. Normal behavior. Normal judgment and thought content.   Assessment and Plan:  Pregnancy: G3P1011 at 4113w5d  1. Supervision of other normal pregnancy, antepartum     Doing well  2. Positive GBS test     PCN for labor/delivery  Preterm labor symptoms and general obstetric precautions including but not limited to vaginal bleeding, contractions, leaking of fluid and fetal movement were reviewed in detail with the patient. Please refer to After Visit Summary  for other counseling recommendations.  Return in about 2 weeks (around 04/21/2017) for ROB.   Roe Coombsachelle A Horace Wishon, CNM

## 2017-04-07 NOTE — Progress Notes (Signed)
Patient reports good fetal movement with irregular contractions.  

## 2017-04-21 ENCOUNTER — Ambulatory Visit (INDEPENDENT_AMBULATORY_CARE_PROVIDER_SITE_OTHER): Payer: Medicaid Other | Admitting: Certified Nurse Midwife

## 2017-04-21 ENCOUNTER — Encounter: Payer: Self-pay | Admitting: Certified Nurse Midwife

## 2017-04-21 VITALS — BP 122/77 | HR 79 | Wt 212.0 lb

## 2017-04-21 DIAGNOSIS — Z3483 Encounter for supervision of other normal pregnancy, third trimester: Secondary | ICD-10-CM

## 2017-04-21 DIAGNOSIS — B951 Streptococcus, group B, as the cause of diseases classified elsewhere: Secondary | ICD-10-CM

## 2017-04-21 DIAGNOSIS — Z3480 Encounter for supervision of other normal pregnancy, unspecified trimester: Secondary | ICD-10-CM

## 2017-04-21 NOTE — Progress Notes (Signed)
Patient reports fetal movement with some irregular contractions and pressure, denies bleeding.

## 2017-04-21 NOTE — Progress Notes (Signed)
   PRENATAL VISIT NOTE  Subjective:  Lauren Potter is a 26 y.o. G3P1011 at 10839w5d being seen today for ongoing prenatal care.  She is currently monitored for the following issues for this low-risk pregnancy and has Encounter for supervision of normal pregnancy, unspecified, unspecified trimester; Late prenatal care; and Positive GBS test on their problem list.  Patient reports backache, no bleeding, no leaking and occasional contractions.  Contractions: Irregular. Vag. Bleeding: None.  Movement: Present. Denies leaking of fluid.   The following portions of the patient's history were reviewed and updated as appropriate: allergies, current medications, past family history, past medical history, past social history, past surgical history and problem list. Problem list updated.  Objective:   Vitals:   04/21/17 0944  BP: 122/77  Pulse: 79  Weight: 212 lb (96.2 kg)    Fetal Status: Fetal Heart Rate (bpm): 150; doppler Fundal Height: 38 cm Movement: Present     General:  Alert, oriented and cooperative. Patient is in no acute distress.  Skin: Skin is warm and dry. No rash noted.   Cardiovascular: Normal heart rate noted  Respiratory: Normal respiratory effort, no problems with respiration noted  Abdomen: Soft, gravid, appropriate for gestational age.  Pain/Pressure: Present     Pelvic: Cervical exam deferred        Extremities: Normal range of motion.  Edema: Trace  Mental Status:  Normal mood and affect. Normal behavior. Normal judgment and thought content.   Assessment and Plan:  Pregnancy: G3P1011 at 7939w5d  1. Supervision of other normal pregnancy, antepartum      Doing well.    2. Positive GBS test     PCN for labor/delivery  Preterm labor symptoms and general obstetric precautions including but not limited to vaginal bleeding, contractions, leaking of fluid and fetal movement were reviewed in detail with the patient. Please refer to After Visit Summary for other counseling  recommendations.  Return in about 1 week (around 04/28/2017) for ROB.   Roe Coombsachelle A Chosen Garron, CNM

## 2017-04-29 ENCOUNTER — Ambulatory Visit (INDEPENDENT_AMBULATORY_CARE_PROVIDER_SITE_OTHER): Payer: Medicaid Other | Admitting: Obstetrics

## 2017-04-29 ENCOUNTER — Other Ambulatory Visit (HOSPITAL_COMMUNITY)
Admission: RE | Admit: 2017-04-29 | Discharge: 2017-04-29 | Disposition: A | Discharge status: Home / Self Care | Payer: Medicaid Other | Source: Ambulatory Visit | Attending: Obstetrics | Admitting: Obstetrics

## 2017-04-29 ENCOUNTER — Encounter: Payer: Self-pay | Admitting: Obstetrics

## 2017-04-29 VITALS — BP 125/69 | HR 74 | Wt 213.0 lb

## 2017-04-29 DIAGNOSIS — Z3480 Encounter for supervision of other normal pregnancy, unspecified trimester: Secondary | ICD-10-CM

## 2017-04-29 DIAGNOSIS — B9689 Other specified bacterial agents as the cause of diseases classified elsewhere: Secondary | ICD-10-CM | POA: Insufficient documentation

## 2017-04-29 DIAGNOSIS — Z3483 Encounter for supervision of other normal pregnancy, third trimester: Secondary | ICD-10-CM

## 2017-04-29 LAB — OB RESULTS CONSOLE GC/CHLAMYDIA: GC PROBE AMP, GENITAL: NEGATIVE

## 2017-04-29 LAB — OB RESULTS CONSOLE GBS: STREP GROUP B AG: POSITIVE

## 2017-04-29 NOTE — Progress Notes (Signed)
Subjective:  Lauren Potter is a 26 y.o. G3P1011 at 6547w6d being seen today for ongoing prenatal care.  She is currently monitored for the following issues for this low-risk pregnancy and has Encounter for supervision of normal pregnancy, unspecified, unspecified trimester; Late prenatal care; and Positive GBS test on their problem list.  Patient reports no complaints.  Contractions: Irregular. Vag. Bleeding: None.  Movement: Present. Denies leaking of fluid.   The following portions of the patient's history were reviewed and updated as appropriate: allergies, current medications, past family history, past medical history, past social history, past surgical history and problem list. Problem list updated.  Objective:   Vitals:   04/29/17 1053  BP: 125/69  Pulse: 74  Weight: 213 lb (96.6 kg)    Fetal Status: Fetal Heart Rate (bpm): 150   Movement: Present     General:  Alert, oriented and cooperative. Patient is in no acute distress.  Skin: Skin is warm and dry. No rash noted.   Cardiovascular: Normal heart rate noted  Respiratory: Normal respiratory effort, no problems with respiration noted  Abdomen: Soft, gravid, appropriate for gestational age. Pain/Pressure: Present     Pelvic:  Cervical exam deferred        Extremities: Normal range of motion.     Mental Status: Normal mood and affect. Normal behavior. Normal judgment and thought content.   Urinalysis:      Assessment and Plan:  Pregnancy: G3P1011 at 5647w6d  1. Supervision of other normal pregnancy, antepartum Rx: - Strep Gp B NAA - Cervicovaginal ancillary only  Term labor symptoms and general obstetric precautions including but not limited to vaginal bleeding, contractions, leaking of fluid and fetal movement were reviewed in detail with the patient. Please refer to After Visit Summary for other counseling recommendations.  Return in about 1 week (around 05/06/2017) for ROB.   Brock BadHarper, Charles A, MD

## 2017-04-29 NOTE — Progress Notes (Signed)
Pt is having irregular ctx and back pain.

## 2017-04-30 LAB — CERVICOVAGINAL ANCILLARY ONLY
Bacterial vaginitis: POSITIVE — AB
CHLAMYDIA, DNA PROBE: NEGATIVE
Candida vaginitis: NEGATIVE
NEISSERIA GONORRHEA: NEGATIVE
Trichomonas: NEGATIVE

## 2017-05-01 ENCOUNTER — Other Ambulatory Visit: Payer: Self-pay | Admitting: Obstetrics

## 2017-05-01 DIAGNOSIS — N760 Acute vaginitis: Principal | ICD-10-CM

## 2017-05-01 DIAGNOSIS — B9689 Other specified bacterial agents as the cause of diseases classified elsewhere: Secondary | ICD-10-CM

## 2017-05-01 LAB — STREP GP B NAA: STREP GROUP B AG: POSITIVE — AB

## 2017-05-01 MED ORDER — SECNIDAZOLE 2 G PO PACK: 1.0000 | PACK | Freq: Once | ORAL | 2 refills | Status: AC

## 2017-05-05 NOTE — L&D Delivery Note (Signed)
Delivery Note At 8:14 AM a viable female was delivered via  (Presentation:LOT).  APGAR: , ; weight  .   Placenta status: complete, intact .  Cord:  with the following complications: .  Cord pH: NA  Anesthesia:  none Episiotomy:  none Lacerations: none  Suture Repair: NA Est. Blood Loss (mL):    Mom to postpartum.  Baby to Couplet care / Skin to Skin.  Thressa ShellerHeather Hogan 05/13/2017, 8:42 AM  Called to the room, patient was complete with strong urge to push. No epidural. Pushed with rapid delivery of fetal head LOT, and shoulders did not restitute. McRoberts and suprapubic pressure. Shoulders did not deliver. Patient attempted to get into hands and knees. She was unable to cooperate with this effort, and was turned to her side and then back onto her back. Shoulders were then delivered.

## 2017-05-06 ENCOUNTER — Encounter: Payer: Medicaid Other | Admitting: Nurse Practitioner

## 2017-05-06 ENCOUNTER — Encounter: Payer: Medicaid Other | Admitting: Obstetrics

## 2017-05-06 ENCOUNTER — Telehealth: Payer: Self-pay

## 2017-05-06 NOTE — Telephone Encounter (Signed)
Returned call and advised PA for solosec was approved.

## 2017-05-07 ENCOUNTER — Encounter: Payer: Medicaid Other | Admitting: Obstetrics

## 2017-05-11 ENCOUNTER — Ambulatory Visit (INDEPENDENT_AMBULATORY_CARE_PROVIDER_SITE_OTHER): Payer: Medicaid Other | Admitting: Certified Nurse Midwife

## 2017-05-11 VITALS — BP 131/84 | HR 76 | Wt 216.4 lb

## 2017-05-11 DIAGNOSIS — B951 Streptococcus, group B, as the cause of diseases classified elsewhere: Secondary | ICD-10-CM

## 2017-05-11 DIAGNOSIS — Z3483 Encounter for supervision of other normal pregnancy, third trimester: Secondary | ICD-10-CM

## 2017-05-11 DIAGNOSIS — Z3480 Encounter for supervision of other normal pregnancy, unspecified trimester: Secondary | ICD-10-CM

## 2017-05-11 NOTE — Progress Notes (Signed)
Pt c/o HA's and intermittent dizziness since today. Denies visual disturbances.

## 2017-05-11 NOTE — Progress Notes (Signed)
   PRENATAL VISIT NOTE  Subjective:  Lauren Potter is a 27 y.o. G3P1011 at 2659w4d being seen today for ongoing prenatal care.  She is currently monitored for the following issues for this low-risk pregnancy and has Encounter for supervision of normal pregnancy, unspecified, unspecified trimester; Late prenatal care; and Positive GBS test on their problem list.  Patient reports no complaints.  Contractions: Irritability. Vag. Bleeding: None.  Movement: Present. Denies leaking of fluid.   The following portions of the patient's history were reviewed and updated as appropriate: allergies, current medications, past family history, past medical history, past social history, past surgical history and problem list. Problem list updated.  Objective:   Vitals:   05/11/17 1602 05/11/17 1603  BP: (!) 142/82 131/84  Pulse: 76   Weight: 216 lb 6.4 oz (98.2 kg)     Fetal Status: Fetal Heart Rate (bpm): 146; doppler Fundal Height: 39 cm Movement: Present  Presentation: Vertex  General:  Alert, oriented and cooperative. Patient is in no acute distress.  Skin: Skin is warm and dry. No rash noted.   Cardiovascular: Normal heart rate noted  Respiratory: Normal respiratory effort, no problems with respiration noted  Abdomen: Soft, gravid, appropriate for gestational age.  Pain/Pressure: Present     Pelvic: Cervical exam performed Dilation: 2.5 Effacement (%): 50 Station: -3  Extremities: Normal range of motion.  Edema: Trace  Mental Status:  Normal mood and affect. Normal behavior. Normal judgment and thought content.   Assessment and Plan:  Pregnancy: G3P1011 at 2659w4d  1. Supervision of other normal pregnancy, antepartum     Doing well.  Out of work note completed. Normotensive on recheck.  No hx of HTN. IOL paperwork and orders placed for 41 weeks.   2. Positive GBS test     PCN for labor/delivery.  Term labor symptoms and general obstetric precautions including but not limited to vaginal  bleeding, contractions, leaking of fluid and fetal movement were reviewed in detail with the patient. Please refer to After Visit Summary for other counseling recommendations.  Return in about 1 week (around 05/18/2017) for ROB, NST.   Roe Coombsachelle A Denney, CNM

## 2017-05-12 ENCOUNTER — Telehealth (HOSPITAL_COMMUNITY): Payer: Self-pay | Admitting: *Deleted

## 2017-05-12 ENCOUNTER — Encounter (HOSPITAL_COMMUNITY): Payer: Self-pay | Admitting: *Deleted

## 2017-05-12 NOTE — Telephone Encounter (Signed)
Preadmission screen  

## 2017-05-13 ENCOUNTER — Inpatient Hospital Stay (HOSPITAL_COMMUNITY)
Admission: AD | Admit: 2017-05-13 | Discharge: 2017-05-15 | DRG: 807 | Disposition: A | Discharge status: Home / Self Care | Payer: Medicaid Other | Source: Ambulatory Visit | Attending: Obstetrics and Gynecology | Admitting: Obstetrics and Gynecology

## 2017-05-13 ENCOUNTER — Encounter (HOSPITAL_COMMUNITY): Payer: Self-pay | Admitting: *Deleted

## 2017-05-13 ENCOUNTER — Other Ambulatory Visit: Payer: Self-pay

## 2017-05-13 DIAGNOSIS — Z3A39 39 weeks gestation of pregnancy: Secondary | ICD-10-CM

## 2017-05-13 DIAGNOSIS — O99824 Streptococcus B carrier state complicating childbirth: Principal | ICD-10-CM | POA: Diagnosis present

## 2017-05-13 DIAGNOSIS — Z3483 Encounter for supervision of other normal pregnancy, third trimester: Secondary | ICD-10-CM | POA: Diagnosis present

## 2017-05-13 LAB — RPR: RPR: NONREACTIVE

## 2017-05-13 LAB — CBC
HCT: 35.9 % — ABNORMAL LOW (ref 36.0–46.0)
Hemoglobin: 12 g/dL (ref 12.0–15.0)
MCH: 23.9 pg — AB (ref 26.0–34.0)
MCHC: 33.4 g/dL (ref 30.0–36.0)
MCV: 71.5 fL — AB (ref 78.0–100.0)
Platelets: 122 10*3/uL — ABNORMAL LOW (ref 150–400)
RBC: 5.02 MIL/uL (ref 3.87–5.11)
RDW: 14.6 % (ref 11.5–15.5)
WBC: 5.7 10*3/uL (ref 4.0–10.5)

## 2017-05-13 LAB — TYPE AND SCREEN
ABO/RH(D): B POS
ANTIBODY SCREEN: NEGATIVE

## 2017-05-13 MED ORDER — LIDOCAINE HCL (PF) 1 % IJ SOLN
30.0000 mL | INTRAMUSCULAR | Status: DC | PRN
  Filled 2017-05-13: qty 30

## 2017-05-13 MED ORDER — MAGNESIUM HYDROXIDE 400 MG/5ML PO SUSP: 30.0000 mL | ORAL | Status: DC | PRN

## 2017-05-13 MED ORDER — TERBUTALINE SULFATE 1 MG/ML IJ SOLN
0.2500 mg | Freq: Once | INTRAMUSCULAR | Status: DC | PRN
  Filled 2017-05-13: qty 1

## 2017-05-13 MED ORDER — ONDANSETRON HCL 4 MG/2ML IJ SOLN: 4.0000 mg | INTRAMUSCULAR | Status: DC | PRN

## 2017-05-13 MED ORDER — COCONUT OIL OIL
1.0000 | TOPICAL_OIL | Status: DC | PRN
Start: 2017-05-13 — End: 2017-05-15
  Administered 2017-05-15: 1 via TOPICAL
  Filled 2017-05-13: qty 120

## 2017-05-13 MED ORDER — PENICILLIN G POT IN DEXTROSE 60000 UNIT/ML IV SOLN
3.0000 10*6.[IU] | INTRAVENOUS | Status: DC
  Administered 2017-05-13: 3 10*6.[IU] via INTRAVENOUS
  Filled 2017-05-13 (×2): qty 50

## 2017-05-13 MED ORDER — ACETAMINOPHEN 325 MG PO TABS: 650.0000 mg | ORAL_TABLET | ORAL | Status: DC | PRN

## 2017-05-13 MED ORDER — PRENATAL MULTIVITAMIN CH
1.0000 | ORAL_TABLET | Freq: Every day | ORAL | Status: DC
  Administered 2017-05-14 – 2017-05-15 (×2): 1 via ORAL
  Filled 2017-05-13 (×2): qty 1

## 2017-05-13 MED ORDER — OXYTOCIN BOLUS FROM INFUSION
500.0000 mL | Freq: Once | INTRAVENOUS | Status: AC
  Administered 2017-05-13: 500 mL via INTRAVENOUS

## 2017-05-13 MED ORDER — BENZOCAINE-MENTHOL 20-0.5 % EX AERO: 1.0000 "application " | INHALATION_SPRAY | CUTANEOUS | Status: DC | PRN

## 2017-05-13 MED ORDER — FENTANYL CITRATE (PF) 100 MCG/2ML IJ SOLN: 50.0000 ug | INTRAMUSCULAR | Status: DC | PRN

## 2017-05-13 MED ORDER — SIMETHICONE 80 MG PO CHEW: 80.0000 mg | CHEWABLE_TABLET | ORAL | Status: DC | PRN

## 2017-05-13 MED ORDER — DIBUCAINE 1 % RE OINT: 1.0000 "application " | TOPICAL_OINTMENT | RECTAL | Status: DC | PRN

## 2017-05-13 MED ORDER — PENICILLIN G POTASSIUM 5000000 UNITS IJ SOLR
5.0000 10*6.[IU] | Freq: Once | INTRAVENOUS | Status: AC
  Administered 2017-05-13: 5 10*6.[IU] via INTRAVENOUS
  Filled 2017-05-13: qty 5

## 2017-05-13 MED ORDER — OXYCODONE-ACETAMINOPHEN 5-325 MG PO TABS: 2.0000 | ORAL_TABLET | ORAL | Status: DC | PRN

## 2017-05-13 MED ORDER — DIPHENHYDRAMINE HCL 25 MG PO CAPS: 25.0000 mg | ORAL_CAPSULE | Freq: Four times a day (QID) | ORAL | Status: DC | PRN

## 2017-05-13 MED ORDER — OXYTOCIN 40 UNITS IN LACTATED RINGERS INFUSION - SIMPLE MED
2.5000 [IU]/h | INTRAVENOUS | Status: DC
  Filled 2017-05-13: qty 1000

## 2017-05-13 MED ORDER — LACTATED RINGERS IV SOLN
INTRAVENOUS | Status: DC
  Administered 2017-05-13: 03:00:00 via INTRAVENOUS

## 2017-05-13 MED ORDER — ONDANSETRON HCL 4 MG PO TABS: 4.0000 mg | ORAL_TABLET | ORAL | Status: DC | PRN

## 2017-05-13 MED ORDER — LACTATED RINGERS IV SOLN: 500.0000 mL | INTRAVENOUS | Status: DC | PRN

## 2017-05-13 MED ORDER — WITCH HAZEL-GLYCERIN EX PADS: 1.0000 "application " | MEDICATED_PAD | CUTANEOUS | Status: DC | PRN

## 2017-05-13 MED ORDER — SOD CITRATE-CITRIC ACID 500-334 MG/5ML PO SOLN
30.0000 mL | ORAL | Status: DC | PRN
Start: 2017-05-13 — End: 2017-05-13

## 2017-05-13 MED ORDER — ONDANSETRON HCL 4 MG/2ML IJ SOLN
4.0000 mg | Freq: Four times a day (QID) | INTRAMUSCULAR | Status: DC | PRN
Start: 2017-05-13 — End: 2017-05-13

## 2017-05-13 MED ORDER — OXYTOCIN 40 UNITS IN LACTATED RINGERS INFUSION - SIMPLE MED
1.0000 m[IU]/min | INTRAVENOUS | Status: DC
  Administered 2017-05-13: 2 m[IU]/min via INTRAVENOUS

## 2017-05-13 MED ORDER — DOCUSATE SODIUM 100 MG PO CAPS
100.0000 mg | ORAL_CAPSULE | Freq: Two times a day (BID) | ORAL | Status: DC
  Administered 2017-05-13 – 2017-05-15 (×4): 100 mg via ORAL
  Filled 2017-05-13 (×4): qty 1

## 2017-05-13 MED ORDER — FLEET ENEMA 7-19 GM/118ML RE ENEM: 1.0000 | ENEMA | RECTAL | Status: DC | PRN

## 2017-05-13 MED ORDER — TETANUS-DIPHTH-ACELL PERTUSSIS 5-2.5-18.5 LF-MCG/0.5 IM SUSP: 0.5000 mL | Freq: Once | INTRAMUSCULAR | Status: DC

## 2017-05-13 MED ORDER — SENNOSIDES-DOCUSATE SODIUM 8.6-50 MG PO TABS
2.0000 | ORAL_TABLET | ORAL | Status: DC
  Administered 2017-05-13 – 2017-05-14 (×2): 2 via ORAL
  Filled 2017-05-13 (×2): qty 2

## 2017-05-13 MED ORDER — IBUPROFEN 600 MG PO TABS
600.0000 mg | ORAL_TABLET | Freq: Four times a day (QID) | ORAL | Status: DC
  Administered 2017-05-13 – 2017-05-15 (×9): 600 mg via ORAL
  Filled 2017-05-13 (×9): qty 1

## 2017-05-13 MED ORDER — ZOLPIDEM TARTRATE 5 MG PO TABS: 5.0000 mg | ORAL_TABLET | Freq: Every evening | ORAL | Status: DC | PRN

## 2017-05-13 MED ORDER — OXYCODONE-ACETAMINOPHEN 5-325 MG PO TABS: 1.0000 | ORAL_TABLET | ORAL | Status: DC | PRN

## 2017-05-13 MED ORDER — ACETAMINOPHEN 325 MG PO TABS
650.0000 mg | ORAL_TABLET | ORAL | Status: DC | PRN
  Administered 2017-05-13: 650 mg via ORAL
  Filled 2017-05-13: qty 2

## 2017-05-13 NOTE — MAU Note (Signed)
PT SAYS  SHE STARTED UC   STRONG AT 2300.  PNC  WITH FAMINA- VE ON Monday  2 CM.    DENIES HSV AND MRSA.  GBS- POSITIVE.

## 2017-05-13 NOTE — H&P (Signed)
Obstetric History and Physical  Lauren Potter is a 27 y.o. G3P1011 with IUP at [redacted]w[redacted]d presenting for SOL. Patient states she has been having  regular contractions, none vaginal bleeding, intact membranes, with active fetal movement.    Prenatal Course Source of Care: [facility] Dating: By [place] --->  Estimated Date of Delivery: 05/14/17 Pregnancy complications or risks: Patient Active Problem List   Diagnosis Date Noted  . Encounter for supervision of normal pregnancy, unspecified, unspecified trimester 12/30/2016  . Late prenatal care 12/30/2016  . Positive GBS test 12/30/2016   She plans to breastfeed She desires IUD for postpartum contraception.   Sono:   @[redacted]w[redacted]d , CWD, normal anatomy, variable presentation, posterior placenta, 639g, 71% EFW  Prenatal labs and studies: ABO, Rh: B/Positive/-- (08/28 1558) Antibody: Negative (08/28 1558) Rubella: 4.69 (08/28 1558) RPR: Non Reactive (10/24 1030)  HBsAg: Negative (08/28 1558)  HIV: Non Reactive (10/24 1030)  NWG:NFAOZHYQ (12/26 1418) 2 hr Glucola  normal Genetic screening normal Anatomy US normal  Prenatal Transfer Tool  Maternal Diabetes: No Genetic Screening: Normal Maternal Ultrasounds/Referrals: Normal Fetal Ultrasounds or other Referrals:  None Maternal Substance Abuse:  No Significant Maternal Medications:  None Significant Maternal Lab Results: Lab values include: Group B Strep positive  Past Medical History:  Diagnosis Date  . Frequent sinus infections   . Medical history non-contributory     Past Surgical History:  Procedure Laterality Date  . APPENDECTOMY      OB History  Gravida Para Term Preterm AB Living  3 1 1   1 1   SAB TAB Ectopic Multiple Live Births        0 1    # Outcome Date GA Lbr Len/2nd Weight Sex Delivery Anes PTL Lv  3 Current           2 Term 08/09/14 [redacted]w[redacted]d 08:21 / 00:27 3.01 kg (6 lb 10.2 oz) M Vag-Spont None  LIV  1 AB               Social History   Socioeconomic History  .  Marital status: Married    Spouse name: None  . Number of children: None  . Years of education: None  . Highest education level: None  Social Needs  . Financial resource strain: None  . Food insecurity - worry: None  . Food insecurity - inability: None  . Transportation needs - medical: None  . Transportation needs - non-medical: None  Occupational History  . None  Tobacco Use  . Smoking status: Never Smoker  . Smokeless tobacco: Never Used  Substance and Sexual Activity  . Alcohol use: No  . Drug use: No  . Sexual activity: Yes    Partners: Male    Birth control/protection: None  Other Topics Concern  . None  Social History Narrative  . None    Family History  Problem Relation Age of Onset  . Diabetes Mother   . Hypertension Father   . Diabetes Maternal Grandmother   . Diabetes Maternal Grandfather   . Hypertension Paternal Grandmother     Medications Prior to Admission  Medication Sig Dispense Refill Last Dose  . Prenat-FeAsp-Meth-FA-DHA w/o A (PRENATE PIXIE) 10-0.6-0.4-200 MG CAPS Take 1 tablet by mouth daily. 30 capsule 12 Past Week at Unknown time    No Known Allergies  Review of Systems: Negative except for what is mentioned in HPI.  Physical Exam: BP 140/70 (BP Location: Right Arm)   Pulse 72   Temp 97.6 F (36.4 C) (Oral)  Resp 20   Ht 5\' 8"  (1.727 m)   Wt 98.4 kg (217 lb)   LMP 06/25/2016 (LMP Unknown)   BMI 32.99 kg/m  CONSTITUTIONAL: Well-developed, well-nourished female in no acute distress.  HENT:  Normocephalic, atraumatic, External right and left ear normal. Oropharynx is clear and moist EYES: Conjunctivae and EOM are normal. Pupils are equal, round, and reactive to light. No scleral icterus.  NECK: Normal range of motion, supple, no masses SKIN: Skin is warm and dry. No rash noted. Not diaphoretic. No erythema. No pallor. NEUROLOGIC: Alert and oriented to person, place, and time. Normal reflexes, muscle tone coordination. No cranial nerve  deficit noted. PSYCHIATRIC: Normal mood and affect. Normal behavior. Normal judgment and thought content. CARDIOVASCULAR: Normal heart rate noted, regular rhythm RESPIRATORY: Effort and breath sounds normal, no problems with respiration noted ABDOMEN: Soft, nontender, nondistended, gravid. MUSCULOSKELETAL: Normal range of motion. No edema and no tenderness. 2+ distal pulses.  Cervical Exam: Dilation: 6 Effacement (%): 90 Station: -1 Presentation: Vertex Exam by:: BRIDGET, RN   FHT:  Baseline rate 140 bpm   Variability moderate  Accelerations present   Decelerations none Contractions: Every 6-8 mins   Pertinent Labs/Studies:   No results found for this or any previous visit (from the past 24 hour(s)).  Assessment : Lauren Potter is a 27 y.o. G3P1011 at 4134w6d being admitted for labor.  Plan: Labor: Expectant management. Will augment with AROM or pitocin as needed.  Analgesia as needed. Patient does not want epidural. FWB: Reassuring fetal heart tracing.   GBS positive - start PCN Delivery plan: Hopeful for vaginal delivery   Caryl AdaJazma Pilar Corrales, DO OB Fellow Faculty Practice, [facility] 05/13/2017, 3:03 AM

## 2017-05-13 NOTE — Lactation Note (Signed)
This note was copied from a baby's chart. Lactation Consultation Note  Patient Name: Lauren Potter ZOXWR'UToday's Date: 05/13/2017 Reason for consult: Initial assessment   Baby 11 hours old.  Mother worried about her milk supply. Hand expressed drops so mother would be encouraged. Explained how breastmilk comes to volume. Baby sleeping. Discussed basics. Mom encouraged to feed baby 8-12 times/24 hours and with feeding cues.  Mom made aware of O/P services, breastfeeding support groups, community resources, and our phone # for post-discharge questions.     Maternal Data Has patient been taught Hand Expression?: Yes Does the patient have breastfeeding experience prior to this delivery?: Yes  Feeding Feeding Type: Breast Fed Length of feed: 25 min  LATCH Score Latch: Repeated attempts needed to sustain latch, nipple held in mouth throughout feeding, stimulation needed to elicit sucking reflex.  Audible Swallowing: A few with stimulation  Type of Nipple: Everted at rest and after stimulation  Comfort (Breast/Nipple): Soft / non-tender  Hold (Positioning): Assistance needed to correctly position infant at breast and maintain latch.  LATCH Score: 7  Interventions Interventions: Breast feeding basics reviewed;Hand express  Lactation Tools Discussed/Used     Consult Status Consult Status: Follow-up Date: 05/14/17 Follow-up type: In-patient    [name], [name] [facility] 05/13/2017, 7:59 PM

## 2017-05-14 NOTE — Progress Notes (Addendum)
POSTPARTUM PROGRESS NOTE  Post Partum Day 1   Subjective:  Lauren Potter is a 27 y.o. [id number] s/p SVD at 1460w6d.  No acute events overnight.  Pt denies problems with ambulating, voiding or po intake.  She denies nausea or vomiting.  Pain is well controlled.  She has had flatus. She has had bowel movement.  Lochia Minimal.   Objective: Blood pressure (!) 105/58, pulse 63, temperature 98 F (36.7 C), temperature source Oral, resp. rate 18, height 5\' 8"  (1.727 m), weight 98.4 kg (217 lb), last menstrual period 06/25/2016, SpO2 100 %, unknown if currently breastfeeding.  Physical Exam:  General: alert, cooperative and no distress Chest: no respiratory distress Heart:regular rate, distal pulses intact Abdomen: soft, nontender,  Uterine Fundus: firm, appropriately tender DVT Evaluation: No calf swelling or tenderness Extremities: trace edema Skin: warm, dry   Recent Labs    05/13/17 0305  HGB 12.0  HCT 35.9*    Assessment/Plan: Lauren Potter is a 27 y.o. [id number] s/p SVD at 6260w6d   PPD#1 - Doing well  Contraception: IUD Feeding: breast Dispo: Plan for discharge tomorrow.   LOS: 1 day   Alroy BailiffParker W Leland, MD 05/14/2017, 12:36 PM   OB FELLOW POSTPARTUM PROGRESS NOTE ATTESTATION  I confirm that I have verified the information documented in the resident's note and that I have also personally reperformed the physical exam and all medical decision making activities.   Rolm BookbinderAmber Ysenia Filice, DO

## 2017-05-14 NOTE — Lactation Note (Signed)
This note was copied from a baby's chart. Lactation Consultation Note  Patient Name: [name] WUJWJ'XToday's Date: 05/14/2017  Mom states baby is latching but she continues to be concerned about her milk supply.  Baby recently fed and is sleeping soundly.  Mom using the manual pump for stimulation and obtained a few mls of colostrum.  Dropper provided to give milk to baby.  Reviewed milk coming to volume and reassured mom.  Instructed to feed with cues and to call for assist prn.   Maternal Data    Feeding    LATCH Score                   Interventions    Lactation Tools Discussed/Used     Consult Status      Huston FoleyMOULDEN, Shirlena Brinegar S 05/14/2017, 1:54 PM

## 2017-05-15 MED ORDER — IBUPROFEN 600 MG PO TABS: 600.0000 mg | ORAL_TABLET | Freq: Four times a day (QID) | ORAL | 0 refills | Status: DC

## 2017-05-15 NOTE — Lactation Note (Signed)
This note was copied from a baby'Potter chart. Lactation Consultation Note  Patient Name: Lauren Potter  Mom states breasts feel full today and she can see milk on baby'Potter mouth.  Reviewed engorgement treatment.  She has a manual pump for home use.  Denies questions or concerns.   Lactation outpatient services and support information reviewed and encouraged prn.   Maternal Data    Feeding    LATCH Score                   Interventions    Lactation Tools Discussed/Used     Consult Status      Huston FoleyMOULDEN, Lauren Potter Potter, 10:18 AM

## 2017-05-15 NOTE — Discharge Summary (Signed)
OB Discharge Summary     Patient Name: Lauren Potter DOB: May 17, 1990 MRN: [id number] Date of admission: 05/13/2017  Delivering MD: Thressa ShellerHOGAN, HEATHER D )  Date of discharge: 06/02/2017   Admitting diagnosis: Spontaneous onset of labor Intrauterine pregnancy: 4014w6d    Secondary diagnosis:  Active Problems:   Patient Active Problem List   Diagnosis Date Noted  . Indication for care in labor or delivery 05/13/2017  . Encounter for supervision of normal pregnancy, unspecified, unspecified trimester 12/30/2016  . Late prenatal care 12/30/2016  . Positive GBS test 12/30/2016    Additional problems: GBS+, received PCN prophylaxis     Discharge diagnosis: Term Pregnancy Delivered                                                                                                Post partum procedures:n/a  Augmentation: none  Complications: None  Hospital course:  Onset of Labor With Vaginal Delivery     27 y.o. yo [id number] at 3514w6d was admitted in Active Labor on 05/13/2017. Patient had an uncomplicated labor course as follows:  Membrane Rupture Time/Date: 6:32 AM ,05/13/2017   Intrapartum Procedures: Episiotomy: None [1]                                         Lacerations:  None [1]  Patient had a delivery of a Viable infant. 05/13/2017  Information for the patient's newborn:  Nena JordanLusa, Michael'Ange Lwanzo [[id number]][[id number]]  Delivery Method: Vag-Spont    Pateint had an uncomplicated postpartum course.  She is ambulating, tolerating a regular diet, passing flatus, and urinating well. Patient is discharged home in stable condition on 06/02/17.   Physical exam  Vitals:   05/14/17 1901 05/15/17 0536  BP: 118/62 118/70  Pulse: 69 (!) 58  Resp: 18 18  Temp: 97.9 F (36.6 C) 98.4 F (36.9 C)  SpO2:  100%    General: alert, cooperative and no distress Lochia: appropriate Uterine Fundus: firm Incision: N/A DVT Evaluation: No evidence of DVT seen on physical exam.  Labs:    Component Value  Date/Time   WBC 5.7 05/13/2017 0305   RBC 5.02 05/13/2017 0305   HGB 12.0 05/13/2017 0305   HGB 10.9 (L) 02/25/2017 1030   HCT 35.9 (L) 05/13/2017 0305   HCT 35.4 02/25/2017 1030   PLT 122 (L) 05/13/2017 0305   PLT 175 02/25/2017 1030   MCV 71.5 (L) 05/13/2017 0305   MCV 74 (L) 02/25/2017 1030   MCH 23.9 (L) 05/13/2017 0305   MCHC 33.4 05/13/2017 0305   RDW 14.6 05/13/2017 0305   RDW 16.3 (H) 02/25/2017 1030   LYMPHSABS 1.8 12/30/2016 1558   EOSABS 0.1 12/30/2016 1558   BASOSABS 0.0 12/30/2016 1558     Discharge instruction: per After Visit Summary and "Baby and Me Booklet".  After visit meds:  No Known Allergies  Allergies as of 05/15/2017   No Known Allergies     Medication List    STOP taking these medications   PRENATE PIXIE 10-0.6-0.4-200  MG Caps     TAKE these medications   ibuprofen 600 MG tablet Commonly known as:  ADVIL,MOTRIN Take 1 tablet (600 mg total) by mouth every 6 (six) hours.        Diet: routine diet  Activity: Advance as tolerated. Pelvic rest for 6 weeks.   Outpatient follow up: 4 weeks Future Appointments  Date Time Provider Department Center  06/10/2017  9:00 AM Orvilla Cornwall A, CNM [facility] None    Follow up Appt: No Follow-up on file.     Postpartum contraception: Progesterone only pills and IUD .  Newborn Data: APGAR (1 MIN): 8   APGAR (5 MINS): 9   APGAR (10 MINS):   @BABYWGTLBSEBC @   Baby Feeding: Breast Disposition:home with mother  Rolm Bookbinder, DO  06/02/2017   OB FELLOW DISCHARGE ATTESTATION  I have seen and examined this patient. I agree with above documentation and have made edits as needed.   Rolm Bookbinder, DO [place] Fellow 5:38 PM

## 2017-05-18 ENCOUNTER — Encounter: Payer: Medicaid Other | Admitting: Certified Nurse Midwife

## 2017-05-21 ENCOUNTER — Inpatient Hospital Stay (HOSPITAL_COMMUNITY): Payer: Medicaid Other

## 2017-06-10 ENCOUNTER — Encounter: Payer: Self-pay | Admitting: Certified Nurse Midwife

## 2017-06-10 ENCOUNTER — Ambulatory Visit (INDEPENDENT_AMBULATORY_CARE_PROVIDER_SITE_OTHER): Payer: Medicaid Other | Admitting: Certified Nurse Midwife

## 2017-06-10 DIAGNOSIS — Z1389 Encounter for screening for other disorder: Secondary | ICD-10-CM | POA: Diagnosis not present

## 2017-06-10 NOTE — Progress Notes (Signed)
Post Partum Exam  Lauren Potter is a 27 y.o. W0J8119G3P2012 female who presents for a postpartum visit. She is 4 weeks postpartum following a spontaneous vaginal delivery with 30 second shoulder dystocia. I have fully reviewed the prenatal and intrapartum course. The delivery was at 39.6 gestational weeks.  Anesthesia: none. Postpartum course has been normal. Baby's course has been normal. Baby is feeding by breast. Bleeding no bleeding. Bowel function is normal. Bladder function is normal. Patient is not sexually active. Contraception method is abstinence. Postpartum depression screening:  The following portions of the patient's history were reviewed and updated as appropriate: allergies, current medications, past family history, past medical history, past social history, past surgical history and problem list.  Review of Systems Pertinent items noted in HPI and remainder of comprehensive ROS otherwise negative.    Objective:  Blood pressure 125/75, pulse 76, temperature 99.3 F (37.4 C), temperature source Oral, resp. rate 16, weight 190 lb 9.6 oz (86.5 kg), currently breastfeeding.  General:  alert, cooperative and no distress   Breasts:  inspection negative, no nipple discharge or bleeding, no masses or nodularity palpable  Lungs: clear to auscultation bilaterally  Heart:  regular rate and rhythm, S1, S2 normal, no murmur, click, rub or gallop  Abdomen: soft, non-tender; bowel sounds normal; no masses,  no organomegaly  Pelvic/Rectal Exam: Not performed.       Pap Smear: 10/31/16: normal  Assessment:    Normal 4 week postpartum exam. Pap smear not done at today's visit.    Plan:   1. Contraception: abstinence 2. IUD planned in 2 weeks 3. Follow up in: 2 weeks for Mirena IUD; 6 months for annual exam or as needed.

## 2017-06-24 ENCOUNTER — Ambulatory Visit (INDEPENDENT_AMBULATORY_CARE_PROVIDER_SITE_OTHER): Payer: Medicaid Other | Admitting: Certified Nurse Midwife

## 2017-06-24 ENCOUNTER — Encounter: Payer: Self-pay | Admitting: *Deleted

## 2017-06-24 ENCOUNTER — Encounter: Payer: Self-pay | Admitting: Certified Nurse Midwife

## 2017-06-24 VITALS — BP 121/70 | HR 62 | Wt 190.2 lb

## 2017-06-24 DIAGNOSIS — Z3202 Encounter for pregnancy test, result negative: Secondary | ICD-10-CM | POA: Diagnosis not present

## 2017-06-24 DIAGNOSIS — Z30430 Encounter for insertion of intrauterine contraceptive device: Secondary | ICD-10-CM | POA: Diagnosis not present

## 2017-06-24 LAB — POCT URINE PREGNANCY: Preg Test, Ur: NEGATIVE

## 2017-06-24 MED ORDER — LEVONORGESTREL 20 MCG/24HR IU IUD
INTRAUTERINE_SYSTEM | Freq: Once | INTRAUTERINE | Status: AC
  Administered 2017-06-24: 10:00:00 via INTRAUTERINE

## 2017-06-24 NOTE — Progress Notes (Signed)
Pt presents for Mirena insertion. Last IC, before SVD.

## 2017-06-24 NOTE — Patient Instructions (Addendum)

## 2017-06-24 NOTE — Progress Notes (Signed)
IUD Procedure Note   DIAGNOSIS: Desires long-term, reversible contraception   PROCEDURE: IUD placement Performing Provider: Orvilla Cornwallachelle Daniela Hernan CNM  Patient counseled prior to procedure. I explained risks and benefits of Mirena IUD, reviewed alternative forms of contraception. Patient stated understanding and consented to continue with procedure.   LMP: unknown, posptartum Pregnancy Test: Negative Lot #: [id number] Expiration Date: Jun 2021   IUD type: [X]  Mirena   [  ] Paragard  [  ] Candise CheLyletta   [  ]  Kyleena  PROCEDURE:  Timeout procedure was performed to ensure right patient and right site.  A bimanual exam was performed to determine the position of the uterus, retroflexed. The speculum was placed. The vagina and cervix was sterilized in the usual manner and sterile technique was maintained throughout the course of the procedure. A single toothed tenaculum was not required. The depth of the uterus was sounded to 10 cm. The IUD was inserted to the appropriate depth and inserted without difficulty.  The string was cut to an estimated 4 cm length. Bleeding was minimal. The patient tolerated the procedure well.   Follow up: The patient tolerated the procedure well without complications.  Standard post-procedure care is explained and return precautions are given.  Orvilla Cornwallachelle Kandra Graven CNM

## 2017-07-22 ENCOUNTER — Other Ambulatory Visit (HOSPITAL_COMMUNITY)
Admission: RE | Admit: 2017-07-22 | Discharge: 2017-07-22 | Disposition: A | Discharge status: Home / Self Care | Payer: Medicaid Other | Source: Ambulatory Visit | Attending: Certified Nurse Midwife | Admitting: Certified Nurse Midwife

## 2017-07-22 ENCOUNTER — Ambulatory Visit (INDEPENDENT_AMBULATORY_CARE_PROVIDER_SITE_OTHER): Payer: Medicaid Other | Admitting: Certified Nurse Midwife

## 2017-07-22 ENCOUNTER — Encounter: Payer: Self-pay | Admitting: Certified Nurse Midwife

## 2017-07-22 VITALS — BP 114/74 | HR 64 | Wt 193.4 lb

## 2017-07-22 DIAGNOSIS — N898 Other specified noninflammatory disorders of vagina: Secondary | ICD-10-CM

## 2017-07-22 DIAGNOSIS — B9689 Other specified bacterial agents as the cause of diseases classified elsewhere: Secondary | ICD-10-CM | POA: Diagnosis not present

## 2017-07-22 DIAGNOSIS — N760 Acute vaginitis: Secondary | ICD-10-CM | POA: Insufficient documentation

## 2017-07-22 DIAGNOSIS — Z3202 Encounter for pregnancy test, result negative: Secondary | ICD-10-CM | POA: Diagnosis not present

## 2017-07-22 DIAGNOSIS — Z30431 Encounter for routine checking of intrauterine contraceptive device: Secondary | ICD-10-CM | POA: Diagnosis not present

## 2017-07-22 DIAGNOSIS — T8332XA Displacement of intrauterine contraceptive device, initial encounter: Secondary | ICD-10-CM

## 2017-07-22 LAB — POCT URINE PREGNANCY: PREG TEST UR: NEGATIVE

## 2017-07-22 NOTE — Progress Notes (Signed)
Subjective:    Lauren Potter  who presents for contraception counseling. The patient has no complaints today. The patient is sexually active. Pertinent past medical history: none.  Likes her Mirena IUD.  Is not currently bleeding with the IUD.  Reports spotting a few days ago that stopped.  Reports heavy bleeding with clots and cramping for 5 days after insertion and then bleeding stopped.  Has been sexually active.  Reports pelvic pain with sexual intercourse.  Did not check for strings.   The information documented in the HPI was reviewed and verified.  Menstrual History: OB History    Gravida Para Term Preterm AB Living   1 1 1     1    SAB TAB Ectopic Multiple Live Births         0 1      LMP: 07/17/17.     Patient Active Problem List   Diagnosis Date Noted   Patient Active Problem List   Diagnosis Date Noted  . Encounter for IUD insertion 06/24/2017    No past medical history on file.  Past Surgical History:  Procedure Laterality Date   Past Surgical History:  Procedure Laterality Date  . APPENDECTOMY        Current Outpatient Prescriptions:  No medication comments found. Current Outpatient Medications on File Prior to Visit  Medication Sig Dispense Refill  . acetaminophen (TYLENOL) 325 MG tablet Take 650 mg by mouth every 6 (six) hours as needed.    Marland Kitchen. ibuprofen (ADVIL,MOTRIN) 600 MG tablet Take 1 tablet (600 mg total) by mouth every 6 (six) hours. 60 tablet 0   No current facility-administered medications on file prior to visit.     Allergies  Allergen Reactions  No Known Allergies   Social History  Substance Use Topics   Social History   Socioeconomic History  . Marital status: Married    Spouse name: Not on file  . Number of children: Not on file  . Years of education: Not on file  . Highest education level: Not on file  Social Needs  . Financial resource strain: Not on file  . Food insecurity - worry: Not on file  . Food insecurity -  inability: Not on file  . Transportation needs - medical: Not on file  . Transportation needs - non-medical: Not on file  Occupational History  . Not on file  Tobacco Use  . Smoking status: Never Smoker  . Smokeless tobacco: Never Used  Substance and Sexual Activity  . Alcohol use: No  . Drug use: No  . Sexual activity: Not Currently    Partners: Male    Birth control/protection: None  Other Topics Concern  . Not on file  Social History Narrative  . Not on file     No family history on file.     Review of Systems Constitutional: negative for weight loss Genitourinary:negative for abnormal menstrual periods and vaginal discharge   Objective:   BP 115/77   Pulse 89   Wt 161 lb (73 kg)   BMI 25.22 kg/m    General:   alert  Skin:   no rash or abnormalities  Lungs:   clear to auscultation bilaterally  Heart:   regular rate and rhythm, S1, S2 normal, no murmur, click, rub or gallop  Breasts:   deferred  Abdomen:  normal findings: no organomegaly, soft, non-tender and no hernia  Pelvis:  External genitalia: normal general appearance Urinary system: urethral meatus normal and bladder without fullness, nontender  Vaginal: normal without tenderness, induration or masses Cervix: normal appearance, IUD strings not present at OS, tender to palpation Adnexa: normal bimanual exam Uterus: anteverted and non-tender, normal size     Negative UPT  Lab Review Urine pregnancy test Labs reviewed yes Radiologic studies reviewed no  50% of 15 min visit spent on counseling and coordination of care.    Assessment & Plan    27 y.o., IUD strings not present  All questions answered.   Condoms for birth control.   1. IUD check up     Strings not present at [facility] with speculum exam     R/O pregnancy: UPT today  2. Intrauterine contraceptive device threads lost, initial encounter     - US PELVIC COMPLETE WITH TRANSVAGINAL; Future  3. Vaginal discharge      - Cervicovaginal  ancillary only   Follow up after [place].

## 2017-07-23 LAB — CERVICOVAGINAL ANCILLARY ONLY
BACTERIAL VAGINITIS: POSITIVE — AB
CANDIDA VAGINITIS: NEGATIVE
Chlamydia: NEGATIVE
NEISSERIA GONORRHEA: NEGATIVE
Trichomonas: NEGATIVE

## 2017-07-24 ENCOUNTER — Ambulatory Visit (HOSPITAL_COMMUNITY)
Admission: RE | Admit: 2017-07-24 | Discharge: 2017-07-24 | Disposition: A | Discharge status: Home / Self Care | Payer: Medicaid Other | Source: Ambulatory Visit | Attending: Certified Nurse Midwife | Admitting: Certified Nurse Midwife

## 2017-07-24 DIAGNOSIS — Y839 Surgical procedure, unspecified as the cause of abnormal reaction of the patient, or of later complication, without mention of misadventure at the time of the procedure: Secondary | ICD-10-CM | POA: Diagnosis not present

## 2017-07-24 DIAGNOSIS — T8332XA Displacement of intrauterine contraceptive device, initial encounter: Secondary | ICD-10-CM | POA: Diagnosis not present

## 2017-08-03 ENCOUNTER — Encounter: Payer: Self-pay | Admitting: Certified Nurse Midwife

## 2017-08-03 ENCOUNTER — Ambulatory Visit (INDEPENDENT_AMBULATORY_CARE_PROVIDER_SITE_OTHER): Payer: Medicaid Other | Admitting: Certified Nurse Midwife

## 2017-08-03 VITALS — BP 116/78 | HR 71 | Ht 68.0 in | Wt 194.4 lb

## 2017-08-03 DIAGNOSIS — Z30011 Encounter for initial prescription of contraceptive pills: Secondary | ICD-10-CM

## 2017-08-03 DIAGNOSIS — N761 Subacute and chronic vaginitis: Secondary | ICD-10-CM

## 2017-08-03 MED ORDER — FLUCONAZOLE 200 MG PO TABS: 200.0000 mg | ORAL_TABLET | Freq: Once | ORAL | 2 refills | Status: AC

## 2017-08-03 MED ORDER — TERCONAZOLE 0.8 % VA CREA: 1.0000 | TOPICAL_CREAM | Freq: Every day | VAGINAL | 2 refills | Status: DC

## 2017-08-03 MED ORDER — METRONIDAZOLE 0.75 % VA GEL: 1.0000 | VAGINAL | 4 refills | Status: DC

## 2017-08-03 MED ORDER — NORETHINDRONE 0.35 MG PO TABS: 1.0000 | ORAL_TABLET | Freq: Every day | ORAL | 11 refills | Status: DC

## 2017-08-03 NOTE — Progress Notes (Signed)
Patient ID: Lauren Potter, female   DOB: April 14, 1991, 27 y.o.   MRN: [id number]  Chief Complaint  Patient presents with  . Follow-up    u/s results     HPI Lauren Potter is a 27 y.o. female.  Here for f/u on US results and culture results. Has BV on swab.  Medications sent to the pharmacy for chronic vaginitis.  Discussed absent IUD on US.  Most likely passed with a blood clot and heavy menses that she had after insertion.  Reports normal period last month.  Was using condoms after IUD check up.  Discussed contraction options.    Reviewed all forms of birth control options available including abstinence; fertility period awareness methods; over the counter/barrier methods; hormonal contraceptive medication including pill, patch, ring, injection,contraceptive implant; hormonal and nonhormonal IUDs (not reviewed since expulsion of last IUD); permanent sterilization options including vasectomy and the various tubal sterilization modalities. Risks and benefits reviewed.  Questions were answered.  Information was given to patient to review.   HPI  Past Medical History:  Diagnosis Date  . Frequent sinus infections   . Medical history non-contributory     Past Surgical History:  Procedure Laterality Date  . APPENDECTOMY      Family History  Problem Relation Age of Onset  . Diabetes Mother   . Hypertension Father   . Diabetes Maternal Grandmother   . Diabetes Maternal Grandfather   . Hypertension Paternal Grandmother     Social History Social History   Tobacco Use  . Smoking status: Never Smoker  . Smokeless tobacco: Never Used  Substance Use Topics  . Alcohol use: No  . Drug use: No    No Known Allergies  Current Outpatient Medications  Medication Sig Dispense Refill  . acetaminophen (TYLENOL) 325 MG tablet Take 650 mg by mouth every 6 (six) hours as needed.    . fluconazole (DIFLUCAN) 200 MG tablet Take 1 tablet (200 mg total) by mouth once for 1 dose. Repeat  dose in 48-72 hours. 3 tablet 2  . ibuprofen (ADVIL,MOTRIN) 600 MG tablet Take 1 tablet (600 mg total) by mouth every 6 (six) hours. (Patient not taking: Reported on 08/03/2017) 60 tablet 0  . metroNIDAZOLE (METROGEL VAGINAL) 0.75 % vaginal gel Place 1 Applicatorful vaginally 2 (two) times a week. For 4-6 months. 70 g 4  . norethindrone (MICRONOR,CAMILA,ERRIN) 0.35 MG tablet Take 1 tablet (0.35 mg total) by mouth daily. 1 Package 11  . terconazole (TERAZOL 3) 0.8 % vaginal cream Place 1 applicator vaginally at bedtime. 20 g 2   No current facility-administered medications for this visit.     Review of Systems Review of Systems Constitutional: negative for fatigue and weight loss Respiratory: negative for cough and wheezing Cardiovascular: negative for chest pain, fatigue and palpitations Gastrointestinal: negative for abdominal pain and change in bowel habits Genitourinary: +BV Integument/breast: negative for nipple discharge Musculoskeletal:negative for myalgias Neurological: negative for gait problems and tremors Behavioral/Psych: negative for abusive relationship, depression Endocrine: negative for temperature intolerance      Blood pressure 116/78, pulse 71, height 5\' 8"  (1.727 m), weight 194 lb 6.4 oz (88.2 kg), last menstrual period 07/17/2017, currently breastfeeding.  Physical Exam Physical Exam General:   alert  PE exam deferred   100% of 15 min visit spent on counseling and coordination of care.   Data Reviewed Previous medical hx, meds, labs  Assessment    1. Chronic vaginitis    - metroNIDAZOLE (METROGEL VAGINAL) 0.75 % vaginal gel; Place  1 Applicatorful vaginally 2 (two) times a week. For 4-6 months.  Dispense: 70 g; Refill: 4 - fluconazole (DIFLUCAN) 200 MG tablet; Take 1 tablet (200 mg total) by mouth once for 1 dose. Repeat dose in 48-72 hours.  Dispense: 3 tablet; Refill: 2 - terconazole (TERAZOL 3) 0.8 % vaginal cream; Place 1 applicator vaginally at bedtime.   Dispense: 20 g; Refill: 2  2. Encounter for initial prescription of contraceptive pills     - norethindrone (MICRONOR,CAMILA,ERRIN) 0.35 MG tablet; Take 1 tablet (0.35 mg total) by mouth daily.  Dispense: 1 Package; Refill: 11     Plan     Meds ordered this encounter  Medications  . metroNIDAZOLE (METROGEL VAGINAL) 0.75 % vaginal gel    Sig: Place 1 Applicatorful vaginally 2 (two) times a week. For 4-6 months.    Dispense:  70 g    Refill:  4  . fluconazole (DIFLUCAN) 200 MG tablet    Sig: Take 1 tablet (200 mg total) by mouth once for 1 dose. Repeat dose in 48-72 hours.    Dispense:  3 tablet    Refill:  2  . terconazole (TERAZOL 3) 0.8 % vaginal cream    Sig: Place 1 applicator vaginally at bedtime.    Dispense:  20 g    Refill:  2  . norethindrone (MICRONOR,CAMILA,ERRIN) 0.35 MG tablet    Sig: Take 1 tablet (0.35 mg total) by mouth daily.    Dispense:  1 Package    Refill:  11     Possible management options include:Depo provera injections Follow up as needed.

## 2017-08-03 NOTE — Progress Notes (Signed)
Pt presents to f/u after u/s checking for IUD. IUD was not visualized via transvag. u/s.

## 2017-08-12 ENCOUNTER — Encounter: Payer: Self-pay | Admitting: Physician Assistant

## 2018-06-12 DIAGNOSIS — L500 Allergic urticaria: Secondary | ICD-10-CM | POA: Diagnosis not present

## 2018-07-01 ENCOUNTER — Other Ambulatory Visit: Payer: Self-pay | Admitting: Allergy and Immunology

## 2018-07-01 ENCOUNTER — Ambulatory Visit
Admission: RE | Admit: 2018-07-01 | Discharge: 2018-07-01 | Disposition: A | Discharge status: Home / Self Care | Payer: BLUE CROSS/BLUE SHIELD | Source: Ambulatory Visit | Attending: Allergy and Immunology | Admitting: Allergy and Immunology

## 2018-07-01 DIAGNOSIS — R05 Cough: Secondary | ICD-10-CM

## 2018-07-01 DIAGNOSIS — R059 Cough, unspecified: Secondary | ICD-10-CM

## 2018-07-01 DIAGNOSIS — L501 Idiopathic urticaria: Secondary | ICD-10-CM | POA: Diagnosis not present

## 2018-07-01 DIAGNOSIS — J301 Allergic rhinitis due to pollen: Secondary | ICD-10-CM | POA: Diagnosis not present

## 2018-07-01 DIAGNOSIS — J3089 Other allergic rhinitis: Secondary | ICD-10-CM | POA: Diagnosis not present

## 2018-12-13 ENCOUNTER — Other Ambulatory Visit: Payer: Self-pay

## 2018-12-13 DIAGNOSIS — Z20822 Contact with and (suspected) exposure to covid-19: Secondary | ICD-10-CM

## 2018-12-14 LAB — NOVEL CORONAVIRUS, NAA: SARS-CoV-2, NAA: NOT DETECTED

## 2018-12-28 ENCOUNTER — Ambulatory Visit: Payer: BC Managed Care – PPO | Admitting: Registered Nurse

## 2018-12-28 ENCOUNTER — Other Ambulatory Visit: Payer: Self-pay

## 2018-12-28 ENCOUNTER — Encounter: Payer: Self-pay | Admitting: Registered Nurse

## 2018-12-28 VITALS — BP 114/72 | HR 90 | Temp 98.5°F | Resp 18 | Ht 66.93 in | Wt 195.0 lb

## 2018-12-28 DIAGNOSIS — Z13220 Encounter for screening for lipoid disorders: Secondary | ICD-10-CM

## 2018-12-28 DIAGNOSIS — L659 Nonscarring hair loss, unspecified: Secondary | ICD-10-CM

## 2018-12-28 DIAGNOSIS — Z13228 Encounter for screening for other metabolic disorders: Secondary | ICD-10-CM

## 2018-12-28 DIAGNOSIS — R5383 Other fatigue: Secondary | ICD-10-CM | POA: Insufficient documentation

## 2018-12-28 DIAGNOSIS — Z130 Encounter for screening for diseases of the blood and blood-forming organs and certain disorders involving the immune mechanism: Secondary | ICD-10-CM

## 2018-12-28 DIAGNOSIS — Z862 Personal history of diseases of the blood and blood-forming organs and certain disorders involving the immune mechanism: Secondary | ICD-10-CM

## 2018-12-28 DIAGNOSIS — Z1329 Encounter for screening for other suspected endocrine disorder: Secondary | ICD-10-CM

## 2018-12-28 DIAGNOSIS — K30 Functional dyspepsia: Secondary | ICD-10-CM | POA: Insufficient documentation

## 2018-12-28 MED ORDER — OMEPRAZOLE 20 MG PO CPDR: 20.0000 mg | DELAYED_RELEASE_CAPSULE | Freq: Every day | ORAL | 0 refills | Status: DC

## 2018-12-28 NOTE — Patient Instructions (Signed)
° ° ° °  If you have lab work done today you will be contacted with your lab results within the next 2 weeks.  If you have not heard from us then please contact us. The fastest way to get your results is to register for My Chart. ° ° °IF you received an x-ray today, you will receive an invoice from [facility]. Please contact [facility] at [phone] with questions or concerns regarding your invoice.  ° °IF you received labwork today, you will receive an invoice from [facility]. Please contact [facility] at [phone] with questions or concerns regarding your invoice.  ° °Our billing staff will not be able to assist you with questions regarding bills from these companies. ° °You will be contacted with the lab results as soon as they are available. The fastest way to get your results is to activate your My Chart account. Instructions are located on the last page of this paperwork. If you have not heard from us regarding the results in 2 weeks, please contact this office. °  ° ° ° °

## 2018-12-28 NOTE — Progress Notes (Signed)
Established Patient Office Visit  Subjective:  Patient ID: Lauren Potter, female    DOB: 02-21-1991  Age: 28 y.o. MRN: [id number]  CC:  Chief Complaint  Patient presents with  . Establish Care    pt states she has been having some joint pain,fatigue,and Nausea x 4 months   . Alopecia    HPI Lauren Potter presents for visit to establish care  C/o ongoing fatigue, nausea, and some joint pain x 4 mo H/o IDA during pregnancy - took iron supplements during and after, but has since stopped. Also states she feels her hair is thinning, including a bald patch about 3x2cm on the back of her head. No noted hair loss anywhere else on her body. Thinning is occurring on temple areas bilaterally. 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 Additionally, some nausea when eating, but not before or after. Denies history of GERD. Denies cough, aspiration, hemoptysis, hematochezia, melena, NVD, constipation  Past Medical History:  Diagnosis Date  . Frequent sinus infections   . Medical history non-contributory     Past Surgical History:  Procedure Laterality Date  . APPENDECTOMY      Family History  Problem Relation Age of Onset  . Diabetes Mother   . Hypertension Father   . Diabetes Maternal Grandmother   . Diabetes Maternal Grandfather   . Hypertension Paternal Grandmother     Social History   Socioeconomic History  . Marital status: Married    Spouse name: Not on file  . Number of children: Not on file  . Years of education: Not on file  . Highest education level: Not on file  Occupational History  . Not on file  Social Needs  . Financial resource strain: Not on file  . Food insecurity    Worry: Not on file    Inability: Not on file  . Transportation needs    Medical: Not on file    Non-medical: Not on file  Tobacco Use  . Smoking status:  Never Smoker  . Smokeless tobacco: Never Used  Substance and Sexual Activity  . Alcohol use: No  . Drug use: No  . Sexual activity: Yes    Partners: Male    Birth control/protection: I.U.D.  Lifestyle  . Physical activity    Days per week: Not on file    Minutes per session: Not on file  . Stress: Not on file  Relationships  . Social [place] phone: Not on file    Gets together: Not on file    Attends religious service: Not on file    Active member of club or organization: Not on file    Attends meetings of clubs or organizations: Not on file    Relationship status: Not on file  . Intimate partner violence    Fear of current or ex partner: Not on file    Emotionally abused: Not on file    Physically abused: Not on file    Forced sexual activity: Not on file  Other Topics Concern  . Not on file  Social History Narrative  . Not on file    Outpatient Medications Prior to Visit  Medication Sig Dispense Refill  . acetaminophen (TYLENOL) 325 MG tablet Take 650 mg by mouth every 6 (six) hours as needed.    .Marland Kitchen  ibuprofen (ADVIL,MOTRIN) 600 MG tablet Take 1 tablet (600 mg total) by mouth every 6 (six) hours. 60 tablet 0  . metroNIDAZOLE (METROGEL VAGINAL) 0.75 % vaginal gel Place 1 Applicatorful vaginally 2 (two) times a week. For 4-6 months. 70 g 4  . norethindrone (MICRONOR,CAMILA,ERRIN) 0.35 MG tablet Take 1 tablet (0.35 mg total) by mouth daily. 1 Package 11  . terconazole (TERAZOL 3) 0.8 % vaginal cream Place 1 applicator vaginally at bedtime. 20 g 2   No facility-administered medications prior to visit.     No Known Allergies  ROS Review of Systems  per hpi    Objective:    Physical Exam  Constitutional: She is oriented to person, place, and time. She appears well-developed and well-nourished. No distress.  Cardiovascular: Normal rate and regular rhythm.  Pulmonary/Chest: Effort normal. No respiratory distress.  Neurological: She is alert and oriented  to person, place, and time.  Skin: Skin is warm and dry. No rash noted. She is not diaphoretic. No erythema. No pallor.  Psychiatric: She has a normal mood and affect. Her behavior is normal. Judgment and thought content normal.  Nursing note and vitals reviewed.   BP 114/72   Pulse 90   Temp 98.5 F (36.9 C) (Oral)   Resp 18   Ht 5' 6.93" (1.7 m)   Wt 195 lb (88.5 kg)   LMP 12/09/2018   SpO2 97%   BMI 30.61 kg/m  Wt Readings from Last 3 Encounters:  12/28/18 195 lb (88.5 kg)  08/03/17 194 lb 6.4 oz (88.2 kg)  07/22/17 193 lb 6.4 oz (87.7 kg)     Health Maintenance Due  Topic Date Due  . INFLUENZA VACCINE  12/04/2018    There are no preventive care reminders to display for this patient.  Lab Results  Component Value Date   TSH 1.970 07/26/2016   Lab Results  Component Value Date   WBC 5.7 05/13/2017   HGB 12.0 05/13/2017   HCT 35.9 (L) 05/13/2017   MCV 71.5 (L) 05/13/2017   PLT 122 (L) 05/13/2017   Lab Results  Component Value Date   NA 138 08/01/2016   K 4.1 08/01/2016   CO2 22 08/01/2016   GLUCOSE 87 08/01/2016   BUN 11 08/01/2016   CREATININE 0.85 08/01/2016   BILITOT <0.2 08/01/2016   ALKPHOS 34 (L) 08/01/2016   AST 15 08/01/2016   ALT 8 08/01/2016   PROT 7.4 08/01/2016   ALBUMIN 4.4 08/01/2016   CALCIUM 9.4 08/01/2016   No results found for: CHOL No results found for: HDL No results found for: LDLCALC No results found for: TRIG No results found for: CHOLHDL No results found for: HGBA1C    Assessment & Plan:   Problem List Items Addressed This Visit    None    Visit Diagnoses    Lipid screening    -  Primary   Relevant Orders   Lipid panel   Other fatigue       Relevant Orders   CBC with Differential   Iron, TIBC and Ferritin Panel   CMP14+EGFR   Hemoglobin A1c   Alopecia       Relevant Orders   CBC with Differential   ANA   TSH   Screening for endocrine, metabolic and immunity disorder       Relevant Orders   CMP14+EGFR    TSH   Hemoglobin A1c   History of anemia       Relevant Orders   CBC with  Differential   Iron, TIBC and Ferritin Panel   CMP14+EGFR   Indigestion       Relevant Medications   omeprazole (PRILOSEC) 20 MG capsule      Meds ordered this encounter  Medications  . omeprazole (PRILOSEC) 20 MG capsule    Sig: Take 1 capsule (20 mg total) by mouth daily.    Dispense:  60 capsule    Refill:  0    Order Specific Question:   Supervising Provider    Answer:   Forrest Moron [id number]    Follow-up: No follow-ups on file.   PLAN  Likely IDA, but will draw labs to confirm, including Cbc and iron panel. Depending on severity, will consider PO iron supplement vs hematology referral.   Patch of hair loss mildly concerning for alopecia areata - in combination with some joint pain, will draw ANA to rule out autoimmune cause. Otherwise, encouraged stretching and tylenol for joint pain  Omeprazole for indigestion, suspect atypical presentation of GERD  She will follow up in around 1 mo for CPE and to review labs  Patient encouraged to call clinic with any questions, comments, or concerns.   Maximiano Coss, NP

## 2018-12-29 ENCOUNTER — Encounter: Payer: Self-pay | Admitting: Registered Nurse

## 2018-12-29 DIAGNOSIS — Z862 Personal history of diseases of the blood and blood-forming organs and certain disorders involving the immune mechanism: Secondary | ICD-10-CM

## 2018-12-29 LAB — CBC WITH DIFFERENTIAL/PLATELET
Basophils Absolute: 0 10*3/uL (ref 0.0–0.2)
Basos: 0 %
EOS (ABSOLUTE): 0.1 10*3/uL (ref 0.0–0.4)
Eos: 2 %
Hematocrit: 37.1 % (ref 34.0–46.6)
Hemoglobin: 11.4 g/dL (ref 11.1–15.9)
Immature Grans (Abs): 0 10*3/uL (ref 0.0–0.1)
Immature Granulocytes: 0 %
Lymphocytes Absolute: 2.2 10*3/uL (ref 0.7–3.1)
Lymphs: 56 %
MCH: 22.3 pg — ABNORMAL LOW (ref 26.6–33.0)
MCHC: 30.7 g/dL — ABNORMAL LOW (ref 31.5–35.7)
MCV: 73 fL — ABNORMAL LOW (ref 79–97)
Monocytes Absolute: 0.3 10*3/uL (ref 0.1–0.9)
Monocytes: 9 %
Neutrophils Absolute: 1.3 10*3/uL — ABNORMAL LOW (ref 1.4–7.0)
Neutrophils: 33 %
Platelets: 186 10*3/uL (ref 150–450)
RBC: 5.11 x10E6/uL (ref 3.77–5.28)
RDW: 14.8 % (ref 11.7–15.4)
WBC: 3.9 10*3/uL (ref 3.4–10.8)

## 2018-12-29 LAB — CMP14+EGFR
ALT: 8 IU/L (ref 0–32)
AST: 14 IU/L (ref 0–40)
Albumin/Globulin Ratio: 1.7 (ref 1.2–2.2)
Albumin: 4.6 g/dL (ref 3.9–5.0)
Alkaline Phosphatase: 35 IU/L — ABNORMAL LOW (ref 39–117)
BUN/Creatinine Ratio: 12 (ref 9–23)
BUN: 9 mg/dL (ref 6–20)
Bilirubin Total: 0.4 mg/dL (ref 0.0–1.2)
CO2: 21 mmol/L (ref 20–29)
Calcium: 9.4 mg/dL (ref 8.7–10.2)
Chloride: 104 mmol/L (ref 96–106)
Creatinine, Ser: 0.78 mg/dL (ref 0.57–1.00)
GFR calc Af Amer: 120 mL/min/{1.73_m2} (ref >59)
GFR calc non Af Amer: 105 mL/min/{1.73_m2} (ref >59)
Globulin, Total: 2.7 g/dL (ref 1.5–4.5)
Glucose: 89 mg/dL (ref 65–99)
Potassium: 4.3 mmol/L (ref 3.5–5.2)
Sodium: 138 mmol/L (ref 134–144)
Total Protein: 7.3 g/dL (ref 6.0–8.5)

## 2018-12-29 LAB — IRON,TIBC AND FERRITIN PANEL
Ferritin: 11 ng/mL — ABNORMAL LOW (ref 15–150)
Iron Saturation: 14 % — ABNORMAL LOW (ref 15–55)
Iron: 53 ug/dL (ref 27–159)
Total Iron Binding Capacity: 366 ug/dL (ref 250–450)
UIBC: 313 ug/dL (ref 131–425)

## 2018-12-29 LAB — HEMOGLOBIN A1C
Est. average glucose Bld gHb Est-mCnc: 111 mg/dL
Hgb A1c MFr Bld: 5.5 % (ref 4.8–5.6)

## 2018-12-29 LAB — LIPID PANEL
Chol/HDL Ratio: 2.5 ratio (ref 0.0–4.4)
Cholesterol, Total: 151 mg/dL (ref 100–199)
HDL: 60 mg/dL (ref >39)
LDL Calculated: 84 mg/dL (ref 0–99)
Triglycerides: 34 mg/dL (ref 0–149)
VLDL Cholesterol Cal: 7 mg/dL (ref 5–40)

## 2018-12-29 LAB — TSH: TSH: 1.72 u[IU]/mL (ref 0.450–4.500)

## 2018-12-29 LAB — ANA: Anti Nuclear Antibody (ANA): NEGATIVE

## 2018-12-29 MED ORDER — FERROUS SULFATE 325 (65 FE) MG PO TABS: 325.0000 mg | ORAL_TABLET | Freq: Two times a day (BID) | ORAL | 1 refills | Status: DC

## 2018-12-29 NOTE — Progress Notes (Signed)
Results letter sent to patient via MyChart  Microcytic hypochromic RBCs on cbc, iron studies show depletion Will start on iron supplementation and recheck levels in 3 mos  Kathrin Ruddy, NP

## 2019-01-25 ENCOUNTER — Emergency Department (HOSPITAL_COMMUNITY): Payer: BC Managed Care – PPO

## 2019-01-25 ENCOUNTER — Encounter (HOSPITAL_COMMUNITY): Payer: Self-pay | Admitting: Emergency Medicine

## 2019-01-25 ENCOUNTER — Emergency Department (HOSPITAL_COMMUNITY)
Admission: EM | Admit: 2019-01-25 | Discharge: 2019-01-25 | Disposition: A | Discharge status: Home / Self Care | Payer: BC Managed Care – PPO | Attending: Emergency Medicine | Admitting: Emergency Medicine

## 2019-01-25 DIAGNOSIS — M25561 Pain in right knee: Secondary | ICD-10-CM | POA: Diagnosis not present

## 2019-01-25 NOTE — ED Provider Notes (Signed)
[facility] EMERGENCY DEPARTMENT Provider Note   CSN: [id number] Arrival date & time: 01/25/19  1419     History   Chief Complaint No chief complaint on file.   HPI Lauren Potter is a 28 y.o. female who presents today for evaluation of right knee pain.  She reports that 5 days ago her left leg slipped out from under her and she felt a pop in her right knee.  She denies any other injuries.  She does not take any blood thinning medications.  She denies striking her head or passing out.  She denies any weakness, numbness, or tingling.  She has put Vicks vapor rub on her knee and trying ice without significant relief.       HPI  Past Medical History:  Diagnosis Date  . Frequent sinus infections   . Medical history non-contributory     Patient Active Problem List   Diagnosis Date Noted  . Fatigue 12/28/2018  . Indigestion 12/28/2018  . Encounter for IUD insertion 06/24/2017    Past Surgical History:  Procedure Laterality Date  . APPENDECTOMY       OB History    Gravida  3   Para  2   Term  2   Preterm      AB  1   Living  2     SAB      TAB      Ectopic      Multiple  0   Live Births  2            Home Medications    Prior to Admission medications   Medication Sig Start Date End Date Taking? Authorizing Provider  ferrous sulfate (FERROUSUL) 325 (65 FE) MG tablet Take 1 tablet (325 mg total) by mouth 2 (two) times daily with a meal. 12/29/18   Janeece Agee, NP  omeprazole (PRILOSEC) 20 MG capsule Take 1 capsule (20 mg total) by mouth daily. 12/28/18   Janeece Agee, NP    Family History Family History  Problem Relation Age of Onset  . Diabetes Mother   . Hypertension Father   . Diabetes Maternal Grandmother   . Diabetes Maternal Grandfather   . Hypertension Paternal Grandmother     Social History Social History   Tobacco Use  . Smoking status: Never Smoker  . Smokeless tobacco: Never Used  Substance Use  Topics  . Alcohol use: No  . Drug use: No     Allergies   Patient has no known allergies.   Review of Systems Review of Systems  Constitutional: Negative for chills and fever.  Musculoskeletal: Negative for back pain and neck pain.       Right knee pain.    Skin: Negative for color change and wound.  Neurological: Negative for weakness and headaches.  All other systems reviewed and are negative.    Physical Exam Updated Vital Signs BP 110/61 (BP Location: Right Arm)   Pulse 66   Temp 99.3 F (37.4 C) (Oral)   Resp 18   SpO2 100%   Physical Exam Vitals signs and nursing note reviewed.  Constitutional:      General: She is not in acute distress.    Appearance: She is not ill-appearing.  HENT:     Head: Normocephalic.  Cardiovascular:     Rate and Rhythm: Normal rate.  Pulmonary:     Effort: Pulmonary effort is normal. No respiratory distress.  Musculoskeletal:     Comments: Right  lower extremity: Right knee is grossly stable to anterior/posterior drawer test and valgus/varus stress.  She does have pain with both valgus and varus stress.  There is no crepitus or deformity palpated.  She does not have proximal calf pain.  She has 5/5 ankle dorsiflexion and plantar flexion.  She is able to straighten her leg.  Skin:    Comments: No lacerations, abrasions, abnormal redness, ecchymosis, or lesions present over the right anterior knee.  Neurological:     Mental Status: She is alert.     Comments: Sensation intact to right lower extremity to light touch.      ED Treatments / Results  Labs (all labs ordered are listed, but only abnormal results are displayed) Labs Reviewed - No data to display  EKG None  Radiology Dg Knee Complete 4 Views Right  Result Date: 01/25/2019 CLINICAL DATA:  28 year old female with a history knee pain and fall EXAM: RIGHT KNEE - COMPLETE 4+ VIEW COMPARISON:  None. FINDINGS: No evidence of fracture, dislocation, or joint effusion. No  evidence of arthropathy or other focal bone abnormality. Soft tissues are unremarkable. IMPRESSION: Negative plain film Electronically Signed   By: Corrie Mckusick D.O.   On: 01/25/2019 15:14    Procedures Procedures (including critical care time)  Medications Ordered in ED Medications - No data to display   Initial Impression / Assessment and Plan / ED Course  I have reviewed the triage vital signs and the nursing notes.  Pertinent labs & imaging results that were available during my care of the patient were reviewed by me and considered in my medical decision making (see chart for details).       Patient presents today for evaluation of right knee pain after she had a fall and felt her knee pop 5 days ago.  On exam she is generally well-appearing.  X-rays were obtained without evidence of fracture, dislocation, or joint effusion.  Her knee is grossly stable on exam.  I am concerned that she may have a ligamentous or other soft tissue injury.  She is given a knee immobilizer and crutches.  Recommended rice, OTC pain meds.  She is given follow-up with orthopedics.  She is neurovascularly intact on exam and denies any other injuries from this.  Return precautions were discussed with patient who states their understanding.  At the time of discharge patient denied any unaddressed complaints or concerns.  Patient is agreeable for discharge home.   Final Clinical Impressions(s) / ED Diagnoses   Final diagnoses:  Acute pain of right knee    ED Discharge Orders    None       Lorin Glass, Hershal Coria 01/25/19 [place], Dan, DO 01/25/19 1907

## 2019-01-25 NOTE — ED Triage Notes (Signed)
Pt here from home with c/o right knee pain after a fall , pt is able to walk on it but with pain , knee is slightly swollen

## 2019-01-25 NOTE — ED Notes (Signed)
Pt verbalized understanding of discharge instructions. Ortho Tech applied knee immobilizer and crutches. Pt demonstrated ability to walk with crutches. Vss, no further questions at this time.

## 2019-01-25 NOTE — Discharge Instructions (Signed)

## 2019-01-25 NOTE — Progress Notes (Signed)
Orthopedic Tech Progress Note Patient Details:  Lauren Potter August 19, 1990 [id number]  Ortho Devices Type of Ortho Device: Knee Immobilizer, Crutches Ortho Device/Splint Location: LRE Ortho Device/Splint Interventions: Adjustment, Application, Ordered   Post Interventions Patient Tolerated: Well Instructions Provided: Care of device, Adjustment of device   Janit Pagan 01/25/2019, 7:07 PM

## 2019-01-25 NOTE — ED Notes (Signed)
Ortho called for knee immobilizer and crutches.  

## 2019-01-28 DIAGNOSIS — S83411A Sprain of medial collateral ligament of right knee, initial encounter: Secondary | ICD-10-CM | POA: Diagnosis not present

## 2019-01-28 DIAGNOSIS — M25561 Pain in right knee: Secondary | ICD-10-CM | POA: Diagnosis not present

## 2019-01-28 DIAGNOSIS — S83419A Sprain of medial collateral ligament of unspecified knee, initial encounter: Secondary | ICD-10-CM | POA: Insufficient documentation

## 2019-02-05 DIAGNOSIS — S83411A Sprain of medial collateral ligament of right knee, initial encounter: Secondary | ICD-10-CM | POA: Diagnosis not present

## 2019-02-14 DIAGNOSIS — S83411A Sprain of medial collateral ligament of right knee, initial encounter: Secondary | ICD-10-CM | POA: Diagnosis not present

## 2019-02-17 DIAGNOSIS — S83411D Sprain of medial collateral ligament of right knee, subsequent encounter: Secondary | ICD-10-CM | POA: Diagnosis not present

## 2019-03-04 DIAGNOSIS — S83411D Sprain of medial collateral ligament of right knee, subsequent encounter: Secondary | ICD-10-CM | POA: Diagnosis not present

## 2019-03-28 ENCOUNTER — Encounter: Payer: Self-pay | Admitting: Registered Nurse

## 2019-03-28 ENCOUNTER — Other Ambulatory Visit: Payer: Self-pay

## 2019-03-28 ENCOUNTER — Ambulatory Visit (INDEPENDENT_AMBULATORY_CARE_PROVIDER_SITE_OTHER): Payer: BC Managed Care – PPO | Admitting: Registered Nurse

## 2019-03-28 VITALS — BP 109/70 | HR 60 | Temp 98.7°F | Resp 16 | Wt 190.0 lb

## 2019-03-28 DIAGNOSIS — Z124 Encounter for screening for malignant neoplasm of cervix: Secondary | ICD-10-CM | POA: Diagnosis not present

## 2019-03-28 DIAGNOSIS — Z862 Personal history of diseases of the blood and blood-forming organs and certain disorders involving the immune mechanism: Secondary | ICD-10-CM | POA: Diagnosis not present

## 2019-03-28 DIAGNOSIS — Z0000 Encounter for general adult medical examination without abnormal findings: Secondary | ICD-10-CM | POA: Diagnosis not present

## 2019-03-28 NOTE — Progress Notes (Signed)
Established Patient Office Visit  Subjective:  Patient ID: Lauren Potter, female    DOB: 09-17-1990  Age: 28 y.o. MRN: [id number]  CC:  Chief Complaint  Patient presents with  . Annual Exam    HPI Lauren Potter presents for CPE  While she is here, we will recheck her CBC and iron panel, as she was trending towards anemic last time. She has been taking daily Fe supplements since. We are hoping her labs move in a better direction.  Unfortunately, her issues with hair growth did not resolve. We will consider a referral to dermatology once these labs return.  Otherwise, no complaints. Feels well overall today. Does not remember date of last pap. Request referral to Gyn to have this done.  Past Medical History:  Diagnosis Date  . Frequent sinus infections   . Medical history non-contributory     Past Surgical History:  Procedure Laterality Date  . APPENDECTOMY      Family History  Problem Relation Age of Onset  . Diabetes Mother   . Hypertension Father   . Diabetes Maternal Grandmother   . Diabetes Maternal Grandfather   . Hypertension Paternal Grandmother     Social History   Socioeconomic History  . Marital status: Married    Spouse name: Not on file  . Number of children: Not on file  . Years of education: Not on file  . Highest education level: Not on file  Occupational History  . Not on file  Social Needs  . Financial resource strain: Not on file  . Food insecurity    Worry: Not on file    Inability: Not on file  . Transportation needs    Medical: Not on file    Non-medical: Not on file  Tobacco Use  . Smoking status: Never Smoker  . Smokeless tobacco: Never Used  Substance and Sexual Activity  . Alcohol use: No  . Drug use: No  . Sexual activity: Yes    Partners: Male    Birth control/protection: I.U.D.  Lifestyle  . Physical activity    Days per week: Not on file    Minutes per session: Not on file  . Stress: Not on file   Relationships  . Social Musicianconnections    Talks on phone: Not on file    Gets together: Not on file    Attends religious service: Not on file    Active member of club or organization: Not on file    Attends meetings of clubs or organizations: Not on file    Relationship status: Not on file  . Intimate partner violence    Fear of current or ex partner: Not on file    Emotionally abused: Not on file    Physically abused: Not on file    Forced sexual activity: Not on file  Other Topics Concern  . Not on file  Social History Narrative  . Not on file    Outpatient Medications Prior to Visit  Medication Sig Dispense Refill  . ferrous sulfate (FERROUSUL) 325 (65 FE) MG tablet Take 1 tablet (325 mg total) by mouth 2 (two) times daily with a meal. 180 tablet 1  . omeprazole (PRILOSEC) 20 MG capsule Take 1 capsule (20 mg total) by mouth daily. 60 capsule 0   No facility-administered medications prior to visit.     Allergies  Allergen Reactions  . Other     ROS Review of Systems  Constitutional: Negative.   HENT: Negative.  Eyes: Negative.   Respiratory: Negative.   Cardiovascular: Negative.   Gastrointestinal: Negative.   Endocrine: Negative.   Genitourinary: Negative.   Musculoskeletal: Negative.   Skin: Negative.   Allergic/Immunologic: Negative.   Neurological: Negative.   Hematological: Negative.   Psychiatric/Behavioral: Negative.   All other systems reviewed and are negative.     Objective:    Physical Exam  Constitutional: She is oriented to person, place, and time. She appears well-developed and well-nourished. No distress.  HENT:  Head: Normocephalic and atraumatic.  Right Ear: External ear normal.  Left Ear: External ear normal.  Nose: Nose normal.  Mouth/Throat: Oropharynx is clear and moist. No oropharyngeal exudate.  Eyes: Pupils are equal, round, and reactive to light. Conjunctivae and EOM are normal. Right eye exhibits no discharge. Left eye exhibits  no discharge. No scleral icterus.  Neck: Normal range of motion. Neck supple. No tracheal deviation present. No thyromegaly present.  Cardiovascular: Normal rate, regular rhythm, normal heart sounds and intact distal pulses. Exam reveals no gallop and no friction rub.  No murmur heard. Pulmonary/Chest: Effort normal and breath sounds normal. No respiratory distress. She has no wheezes. She has no rales. She exhibits no tenderness.  Abdominal: Soft. Bowel sounds are normal. She exhibits no distension and no mass. There is no abdominal tenderness. There is no rebound and no guarding.  Musculoskeletal: Normal range of motion.        General: No tenderness, deformity or edema.  Lymphadenopathy:    She has no cervical adenopathy.  Neurological: She is alert and oriented to person, place, and time. No cranial nerve deficit. She exhibits normal muscle tone. Coordination normal.  Skin: Skin is warm and dry. No rash noted. She is not diaphoretic. No erythema. No pallor.  Psychiatric: She has a normal mood and affect. Her behavior is normal. Judgment and thought content normal.  Nursing note and vitals reviewed.   BP 109/70   Pulse 60   Temp 98.7 F (37.1 C) (Oral)   Resp 16   Wt 190 lb (86.2 kg)   LMP 03/25/2019   BMI 29.82 kg/m  Wt Readings from Last 3 Encounters:  03/28/19 190 lb (86.2 kg)  12/28/18 195 lb (88.5 kg)  08/03/17 194 lb 6.4 oz (88.2 kg)     There are no preventive care reminders to display for this patient.  There are no preventive care reminders to display for this patient.  Lab Results  Component Value Date   TSH 1.720 12/28/2018   Lab Results  Component Value Date   WBC 3.9 12/28/2018   HGB 11.4 12/28/2018   HCT 37.1 12/28/2018   MCV 73 (L) 12/28/2018   PLT 186 12/28/2018   Lab Results  Component Value Date   NA 138 12/28/2018   K 4.3 12/28/2018   CO2 21 12/28/2018   GLUCOSE 89 12/28/2018   BUN 9 12/28/2018   CREATININE 0.78 12/28/2018   BILITOT 0.4  12/28/2018   ALKPHOS 35 (L) 12/28/2018   AST 14 12/28/2018   ALT 8 12/28/2018   PROT 7.3 12/28/2018   ALBUMIN 4.6 12/28/2018   CALCIUM 9.4 12/28/2018   Lab Results  Component Value Date   CHOL 151 12/28/2018   Lab Results  Component Value Date   HDL 60 12/28/2018   Lab Results  Component Value Date   LDLCALC 84 12/28/2018   Lab Results  Component Value Date   TRIG 34 12/28/2018   Lab Results  Component Value Date   CHOLHDL  2.5 12/28/2018   Lab Results  Component Value Date   HGBA1C 5.5 12/28/2018      Assessment & Plan:   Problem List Items Addressed This Visit    None    Visit Diagnoses    Annual physical exam    -  Primary   History of anemia       Relevant Orders   CBC   Iron, TIBC and Ferritin Panel   Papanicolaou smear for cervical cancer screening       Relevant Orders   Ambulatory referral to Gynecology      No orders of the defined types were placed in this encounter.   Follow-up: No follow-ups on file.   PLAN  Continue Fe supplement. Drawing CBC and iron studies, will follow up with results as warranted. If anemic trends are improving, will consider derm referral for concern over hair growth.  Otherwise, normal findings on exam. No concerns noted  Referral to gyn for pap smear  Patient encouraged to call clinic with any questions, comments, or concerns.   Janeece Agee, NP

## 2019-03-28 NOTE — Patient Instructions (Addendum)
   If you have lab work done today you will be contacted with your lab results within the next 2 weeks.  If you have not heard from us then please contact us. The fastest way to get your results is to register for My Chart.   IF you received an x-ray today, you will receive an invoice from [facility]. Please contact [facility] at [phone] with questions or concerns regarding your invoice.   IF you received labwork today, you will receive an invoice from [facility]. Please contact [facility] at [phone] with questions or concerns regarding your invoice.   Our billing staff will not be able to assist you with questions regarding bills from these companies.  You will be contacted with the lab results as soon as they are available. The fastest way to get your results is to activate your My Chart account. Instructions are located on the last page of this paperwork. If you have not heard from us regarding the results in 2 weeks, please contact this office.       Health Maintenance, Female Adopting a healthy lifestyle and getting preventive care are important in promoting health and wellness. Ask your health care provider about:  The right schedule for you to have regular tests and exams.  Things you can do on your own to prevent diseases and keep yourself healthy. What should I know about diet, weight, and exercise? Eat a healthy diet   Eat a diet that includes plenty of vegetables, fruits, low-fat dairy products, and lean protein.  Do not eat a lot of foods that are high in solid fats, added sugars, or sodium. Maintain a healthy weight Body mass index (BMI) is used to identify weight problems. It estimates body fat based on height and weight. Your health care provider can help determine your BMI and help you achieve or maintain a healthy weight. Get regular exercise Get regular exercise. This is one of the most important things you can do for your health. Most  adults should:  Exercise for at least 150 minutes each week. The exercise should increase your heart rate and make you sweat (moderate-intensity exercise).  Do strengthening exercises at least twice a week. This is in addition to the moderate-intensity exercise.  Spend less time sitting. Even light physical activity can be beneficial. Watch cholesterol and blood lipids Have your blood tested for lipids and cholesterol at 27 years of age, then have this test every 5 years. Have your cholesterol levels checked more often if:  Your lipid or cholesterol levels are high.  You are older than 27 years of age.  You are at high risk for heart disease. What should I know about cancer screening? Depending on your health history and family history, you may need to have cancer screening at various ages. This may include screening for:  Breast cancer.  Cervical cancer.  Colorectal cancer.  Skin cancer.  Lung cancer. What should I know about heart disease, diabetes, and high blood pressure? Blood pressure and heart disease  High blood pressure causes heart disease and increases the risk of stroke. This is more likely to develop in people who have high blood pressure readings, are of African descent, or are overweight.  Have your blood pressure checked: ? Every 3-5 years if you are 18-39 years of age. ? Every year if you are 40 years old or older. Diabetes Have regular diabetes screenings. This checks your fasting blood sugar level. Have the screening done:  Once   every three years after age 40 if you are at a normal weight and have a low risk for diabetes.  More often and at a younger age if you are overweight or have a high risk for diabetes. What should I know about preventing infection? Hepatitis B If you have a higher risk for hepatitis B, you should be screened for this virus. Talk with your health care provider to find out if you are at risk for hepatitis B infection. Hepatitis  C Testing is recommended for:  Everyone born from 1945 through 1965.  Anyone with known risk factors for hepatitis C. Sexually transmitted infections (STIs)  Get screened for STIs, including gonorrhea and chlamydia, if: ? You are sexually active and are younger than 27 years of age. ? You are older than 27 years of age and your health care provider tells you that you are at risk for this type of infection. ? Your sexual activity has changed since you were last screened, and you are at increased risk for chlamydia or gonorrhea. Ask your health care provider if you are at risk.  Ask your health care provider about whether you are at high risk for HIV. Your health care provider may recommend a prescription medicine to help prevent HIV infection. If you choose to take medicine to prevent HIV, you should first get tested for HIV. You should then be tested every 3 months for as long as you are taking the medicine. Pregnancy  If you are about to stop having your period (premenopausal) and you may become pregnant, seek counseling before you get pregnant.  Take 400 to 800 micrograms (mcg) of folic acid every day if you become pregnant.  Ask for birth control (contraception) if you want to prevent pregnancy. Osteoporosis and menopause Osteoporosis is a disease in which the bones lose minerals and strength with aging. This can result in bone fractures. If you are 65 years old or older, or if you are at risk for osteoporosis and fractures, ask your health care provider if you should:  Be screened for bone loss.  Take a calcium or vitamin D supplement to lower your risk of fractures.  Be given hormone replacement therapy (HRT) to treat symptoms of menopause. Follow these instructions at home: Lifestyle  Do not use any products that contain nicotine or tobacco, such as cigarettes, e-cigarettes, and chewing tobacco. If you need help quitting, ask your health care provider.  Do not use street  drugs.  Do not share needles.  Ask your health care provider for help if you need support or information about quitting drugs. Alcohol use  Do not drink alcohol if: ? Your health care provider tells you not to drink. ? You are pregnant, may be pregnant, or are planning to become pregnant.  If you drink alcohol: ? Limit how much you use to 0-1 drink a day. ? Limit intake if you are breastfeeding.  Be aware of how much alcohol is in your drink. In the [place] one drink equals one 12 oz bottle of beer (355 mL), one 5 oz glass of wine (148 mL), or one 1 oz glass of hard liquor (44 mL). General instructions  Schedule regular health, dental, and eye exams.  Stay current with your vaccines.  Tell your health care provider if: ? You often feel depressed. ? You have ever been abused or do not feel safe at home. Summary  Adopting a healthy lifestyle and getting preventive care are important in promoting health and   wellness.  Follow your health care provider's instructions about healthy diet, exercising, and getting tested or screened for diseases.  Follow your health care provider's instructions on monitoring your cholesterol and blood pressure. This information is not intended to replace advice given to you by your health care provider. Make sure you discuss any questions you have with your health care provider. Document Released: 11/04/2010 Document Revised: 04/14/2018 Document Reviewed: 04/14/2018 Elsevier Patient Education  2020 [employer] Inc.     Why follow it? Research shows. . Those who follow the Mediterranean diet have a reduced risk of heart disease  . The diet is associated with a reduced incidence of Parkinson's and Alzheimer's diseases . People following the diet may have longer life expectancies and lower rates of chronic diseases  . The Dietary Guidelines for Americans recommends the Mediterranean diet as an eating plan to promote health and prevent disease  What Is the  Mediterranean Diet?  . Healthy eating plan based on typical foods and recipes of Mediterranean-style cooking . The diet is primarily a plant based diet; these foods should make up a majority of meals   Starches - Plant based foods should make up a majority of meals - They are an important sources of vitamins, minerals, energy, antioxidants, and fiber - Choose whole grains, foods high in fiber and minimally processed items  - Typical grain sources include wheat, oats, barley, corn, brown rice, bulgar, farro, millet, polenta, couscous  - Various types of beans include chickpeas, lentils, fava beans, black beans, white beans   Fruits  Veggies - Large quantities of antioxidant rich fruits & veggies; 6 or more servings  - Vegetables can be eaten raw or lightly drizzled with oil and cooked  - Vegetables common to the traditional Mediterranean Diet include: artichokes, arugula, beets, broccoli, brussel sprouts, cabbage, carrots, celery, collard greens, cucumbers, eggplant, kale, leeks, lemons, lettuce, mushrooms, okra, onions, peas, peppers, potatoes, pumpkin, radishes, rutabaga, shallots, spinach, sweet potatoes, turnips, zucchini - Fruits common to the Mediterranean Diet include: apples, apricots, avocados, cherries, clementines, dates, figs, grapefruits, grapes, melons, nectarines, oranges, peaches, pears, pomegranates, strawberries, tangerines  Fats - Replace butter and margarine with healthy oils, such as olive oil, canola oil, and tahini  - Limit nuts to no more than a handful a day  - Nuts include walnuts, almonds, pecans, pistachios, pine nuts  - Limit or avoid candied, honey roasted or heavily salted nuts - Olives are central to the Mediterranean diet - can be eaten whole or used in a variety of dishes   Meats Protein - Limiting red meat: no more than a few times a month - When eating red meat: choose lean cuts and keep the portion to the size of deck of cards - Eggs: approx. 0 to 4 times a  week  - Fish and lean poultry: at least 2 a week  - Healthy protein sources include, chicken, turkey, lean beef, lamb - Increase intake of seafood such as tuna, salmon, trout, mackerel, shrimp, scallops - Avoid or limit high fat processed meats such as sausage and bacon  Dairy - Include moderate amounts of low fat dairy products  - Focus on healthy dairy such as fat free yogurt, skim milk, low or reduced fat cheese - Limit dairy products higher in fat such as whole or 2% milk, cheese, ice cream  Alcohol - Moderate amounts of red wine is ok  - No more than 5 oz daily for women (all ages) and men older than age 65  -   No more than 10 oz of wine daily for men younger than 65  Other - Limit sweets and other desserts  - Use herbs and spices instead of salt to flavor foods  - Herbs and spices common to the traditional Mediterranean Diet include: basil, bay leaves, chives, cloves, cumin, fennel, garlic, lavender, marjoram, mint, oregano, parsley, pepper, rosemary, sage, savory, sumac, tarragon, thyme   It's not just a diet, it's a lifestyle:  . The Mediterranean diet includes lifestyle factors typical of those in the region  . Foods, drinks and meals are best eaten with others and savored . Daily physical activity is important for overall good health . This could be strenuous exercise like running and aerobics . This could also be more leisurely activities such as walking, housework, yard-work, or taking the stairs . Moderation is the key; a balanced and healthy diet accommodates most foods and drinks . Consider portion sizes and frequency of consumption of certain foods   Meal Ideas & Options:  . Breakfast:  o Whole wheat toast or whole wheat English muffins with peanut butter & hard boiled egg o Steel cut oats topped with apples & cinnamon and skim milk  o Fresh fruit: banana, strawberries, melon, berries, peaches  o Smoothies: strawberries, bananas, greek yogurt, peanut butter o Low fat  greek yogurt with blueberries and granola  o Egg white omelet with spinach and mushrooms o Breakfast couscous: whole wheat couscous, apricots, skim milk, cranberries  . Sandwiches:  o Hummus and grilled vegetables (peppers, zucchini, squash) on whole wheat bread   o Grilled chicken on whole wheat pita with lettuce, tomatoes, cucumbers or tzatziki  o Tuna salad on whole wheat bread: tuna salad made with greek yogurt, olives, red peppers, capers, green onions o Garlic rosemary lamb pita: lamb sauted with garlic, rosemary, salt & pepper; add lettuce, cucumber, greek yogurt to pita - flavor with lemon juice and black pepper  . Seafood:  o Mediterranean grilled salmon, seasoned with garlic, basil, parsley, lemon juice and black pepper o Shrimp, lemon, and spinach whole-grain pasta salad made with low fat greek yogurt  o Seared scallops with lemon orzo  o Seared tuna steaks seasoned salt, pepper, coriander topped with tomato mixture of olives, tomatoes, olive oil, minced garlic, parsley, green onions and cappers  . Meats:  o Herbed greek chicken salad with kalamata olives, cucumber, feta  o Red bell peppers stuffed with spinach, bulgur, lean ground beef (or lentils) & topped with feta   o Kebabs: skewers of chicken, tomatoes, onions, zucchini, squash  o Turkey burgers: made with red onions, mint, dill, lemon juice, feta cheese topped with roasted red peppers . Vegetarian o Cucumber salad: cucumbers, artichoke hearts, celery, red onion, feta cheese, tossed in olive oil & lemon juice  o Hummus and whole grain pita points with a greek salad (lettuce, tomato, feta, olives, cucumbers, red onion) o Lentil soup with celery, carrots made with vegetable broth, garlic, salt and pepper  o Tabouli salad: parsley, bulgur, mint, scallions, cucumbers, tomato, radishes, lemon juice, olive oil, salt and pepper.       Fat and Cholesterol Restricted Eating Plan Eating a diet that limits fat and cholesterol may  help lower your risk for heart disease and other conditions. Your body needs fat and cholesterol for basic functions, but eating too much of these things can be harmful to your health. Your health care provider may order lab tests to check your blood fat (lipid) and cholesterol levels. This helps your   health care provider understand your risk for certain conditions and whether you need to make diet changes. Work with your health care provider or dietitian to make an eating plan that is right for you. Your plan includes:  Limit your fat intake to ______% or less of your total calories a day.  Limit your saturated fat intake to ______% or less of your total calories a day.  Limit the amount of cholesterol in your diet to less than _________mg a day.  Eat ___________ g of fiber a day. What are tips for following this plan? General guidelines   If you are overweight, work with your health care provider to lose weight safely. Losing just 5-10% of your body weight can improve your overall health and help prevent diseases such as diabetes and heart disease.  Avoid: ? Foods with added sugar. ? Fried foods. ? Foods that contain partially hydrogenated oils, including stick margarine, some tub margarines, cookies, crackers, and other baked goods.  Limit alcohol intake to no more than 1 drink a day for nonpregnant women and 2 drinks a day for men. One drink equals 12 oz of beer, 5 oz of wine, or 1 oz of hard liquor. Reading food labels  Check food labels for: ? Trans fats, partially hydrogenated oils, or high amounts of saturated fat. Avoid foods that contain saturated fat and trans fat. ? The amount of cholesterol in each serving. Try to eat no more than 200 mg of cholesterol each day. ? The amount of fiber in each serving. Try to eat at least 20-30 g of fiber each day.  Choose foods with healthy fats, such as: ? Monounsaturated and polyunsaturated fats. These include olive and canola oil,  flaxseeds, walnuts, almonds, and seeds. ? Omega-3 fats. These are found in foods such as salmon, mackerel, sardines, tuna, flaxseed oil, and ground flaxseeds.  Choose grain products that have whole grains. Look for the word "whole" as the first word in the ingredient list. Cooking  Cook foods using methods other than frying. Baking, boiling, grilling, and broiling are some healthy options.  Eat more home-cooked food and less restaurant, buffet, and fast food.  Avoid cooking using saturated fats. ? Animal sources of saturated fats include meats, butter, and cream. ? Plant sources of saturated fats include palm oil, palm kernel oil, and coconut oil. Meal planning   At meals, imagine dividing your plate into fourths: ? Fill one-half of your plate with vegetables and green salads. ? Fill one-fourth of your plate with whole grains. ? Fill one-fourth of your plate with lean protein foods.  Eat fish that is high in omega-3 fats at least two times a week.  Eat more foods that contain fiber, such as whole grains, beans, apples, broccoli, carrots, peas, and barley. These foods help promote healthy cholesterol levels in the blood. Recommended foods Grains  Whole grains, such as whole wheat or whole grain breads, crackers, cereals, and pasta. Unsweetened oatmeal, bulgur, barley, quinoa, or brown rice. Corn or whole wheat flour tortillas. Vegetables  Fresh or frozen vegetables (raw, steamed, roasted, or grilled). Green salads. Fruits  All fresh, canned (in natural juice), or frozen fruits. Meats and other protein foods  Ground beef (85% or leaner), grass-fed beef, or beef trimmed of fat. Skinless chicken or turkey. Ground chicken or turkey. Pork trimmed of fat. All fish and seafood. Egg whites. Dried beans, peas, or lentils. Unsalted nuts or seeds. Unsalted canned beans. Natural nut butters without added sugar and oil. Dairy    Low-fat or nonfat dairy products, such as skim or 1% milk, 2% or  reduced-fat cheeses, low-fat and fat-free ricotta or cottage cheese, or plain low-fat and nonfat yogurt. Fats and oils  Tub margarine without trans fats. Light or reduced-fat mayonnaise and salad dressings. Avocado. Olive, canola, sesame, or safflower oils. The items listed above may not be a complete list of recommended foods or beverages. Contact your dietitian for more options. Foods to avoid Grains  White bread. White pasta. White rice. Cornbread. Bagels, pastries, and croissants. Crackers and snack foods that contain trans fat and hydrogenated oils. Vegetables  Vegetables cooked in cheese, cream, or butter sauce. Fried vegetables. Fruits  Canned fruit in heavy syrup. Fruit in cream or butter sauce. Fried fruit. Meats and other protein foods  Fatty cuts of meat. Ribs, chicken wings, bacon, sausage, bologna, salami, chitterlings, fatback, hot dogs, bratwurst, and packaged lunch meats. Liver and organ meats. Whole eggs and egg yolks. Chicken and turkey with skin. Fried meat. Dairy  Whole or 2% milk, cream, half-and-half, and cream cheese. Whole milk cheeses. Whole-fat or sweetened yogurt. Full-fat cheeses. Nondairy creamers and whipped toppings. Processed cheese, cheese spreads, and cheese curds. Beverages  Alcohol. Sugar-sweetened drinks such as sodas, lemonade, and fruit drinks. Fats and oils  Butter, stick margarine, lard, shortening, ghee, or bacon fat. Coconut, palm kernel, and palm oils. Sweets and desserts  Corn syrup, sugars, honey, and molasses. Candy. Jam and jelly. Syrup. Sweetened cereals. Cookies, pies, cakes, donuts, muffins, and ice cream. The items listed above may not be a complete list of foods and beverages to avoid. Contact your dietitian for more information. Summary  Your body needs fat and cholesterol for basic functions. However, eating too much of these things can be harmful to your health.  Work with your health care provider and dietitian to follow a  diet low in fat and cholesterol. Doing this may help lower your risk for heart disease and other conditions.  Choose healthy fats, such as monounsaturated and polyunsaturated fats, and foods high in omega-3 fatty acids.  Eat fiber-rich foods, such as whole grains, beans, peas, fruits, and vegetables.  Limit or avoid alcohol, fried foods, and foods high in saturated fats, partially hydrogenated oils, and sugar. This information is not intended to replace advice given to you by your health care provider. Make sure you discuss any questions you have with your health care provider. Document Released: 04/21/2005 Document Revised: 04/03/2017 Document Reviewed: 01/06/2017 Elsevier Patient Education  2020 [employer] Inc.  American Heart Association (AHA) Exercise Recommendation  Being physically active is important to prevent heart disease and stroke, the nation's No. 1and No. 5killers. To improve overall cardiovascular health, we suggest at least 150 minutes per week of moderate exercise or 75 minutes per week of vigorous exercise (or a combination of moderate and vigorous activity). Thirty minutes a day, five times a week is an easy goal to remember. You will also experience benefits even if you divide your time into two or three segments of 10 to 15 minutes per day.  For people who would benefit from lowering their blood pressure or cholesterol, we recommend 40 minutes of aerobic exercise of moderate to vigorous intensity three to four times a week to lower the risk for heart attack and stroke.  Physical activity is anything that makes you move your body and burn calories.  This includes things like climbing stairs or playing sports. Aerobic exercises benefit your heart, and include walking, jogging, swimming or biking. Strength and   stretching exercises are best for overall stamina and flexibility.  The simplest, positive change you can make to effectively improve your heart health is to start  walking. It's enjoyable, free, easy, social and great exercise. A walking program is flexible and boasts high success rates because people can stick with it. It's easy for walking to become a regular and satisfying part of life.   For Overall Cardiovascular Health:  At least 30 minutes of moderate-intensity aerobic activity at least 5 days per week for a total of 150  OR   At least 25 minutes of vigorous aerobic activity at least 3 days per week for a total of 75 minutes; or a combination of moderate- and vigorous-intensity aerobic activity  AND   Moderate- to high-intensity muscle-strengthening activity at least 2 days per week for additional health benefits.  For Lowering Blood Pressure and Cholesterol  An average 40 minutes of moderate- to vigorous-intensity aerobic activity 3 or 4 times per week  What if I can't make it to the time goal? Something is always better than nothing! And everyone has to start somewhere. Even if you've been sedentary for years, today is the day you can begin to make healthy changes in your life. If you don't think you'll make it for 30 or 40 minutes, set a reachable goal for today. You can work up toward your overall goal by increasing your time as you get stronger. Don't let all-or-nothing thinking rob you of doing what you can every day.  Source:http://www.heart.org    

## 2019-03-29 ENCOUNTER — Encounter: Payer: Self-pay | Admitting: Registered Nurse

## 2019-03-29 LAB — CBC
Hematocrit: 37 % (ref 34.0–46.6)
Hemoglobin: 11.8 g/dL (ref 11.1–15.9)
MCH: 23.3 pg — ABNORMAL LOW (ref 26.6–33.0)
MCHC: 31.9 g/dL (ref 31.5–35.7)
MCV: 73 fL — ABNORMAL LOW (ref 79–97)
Platelets: 157 10*3/uL (ref 150–450)
RBC: 5.06 x10E6/uL (ref 3.77–5.28)
RDW: 14.2 % (ref 11.7–15.4)
WBC: 3.1 10*3/uL — ABNORMAL LOW (ref 3.4–10.8)

## 2019-03-29 LAB — IRON,TIBC AND FERRITIN PANEL
Ferritin: 35 ng/mL (ref 15–150)
Iron Saturation: 26 % (ref 15–55)
Iron: 84 ug/dL (ref 27–159)
Total Iron Binding Capacity: 324 ug/dL (ref 250–450)
UIBC: 240 ug/dL (ref 131–425)

## 2019-03-29 NOTE — Progress Notes (Signed)
Results improving from an anemia and iron deficiency standpoint Continue iron supplement, return to clinic in 1 year for CPE and labs.  Kathrin Ruddy, NP

## 2019-05-24 ENCOUNTER — Other Ambulatory Visit: Payer: BC Managed Care – PPO

## 2019-07-11 ENCOUNTER — Ambulatory Visit: Payer: BC Managed Care – PPO | Admitting: Registered Nurse

## 2019-07-26 ENCOUNTER — Ambulatory Visit (INDEPENDENT_AMBULATORY_CARE_PROVIDER_SITE_OTHER): Payer: BC Managed Care – PPO | Admitting: Registered Nurse

## 2019-07-26 ENCOUNTER — Encounter: Payer: Self-pay | Admitting: Registered Nurse

## 2019-07-26 ENCOUNTER — Other Ambulatory Visit: Payer: Self-pay

## 2019-07-26 VITALS — BP 120/79 | HR 62 | Temp 98.0°F | Ht 67.0 in | Wt 194.6 lb

## 2019-07-26 DIAGNOSIS — K909 Intestinal malabsorption, unspecified: Secondary | ICD-10-CM

## 2019-07-26 DIAGNOSIS — R1012 Left upper quadrant pain: Secondary | ICD-10-CM

## 2019-07-26 NOTE — Patient Instructions (Signed)
° ° ° °  If you have lab work done today you will be contacted with your lab results within the next 2 weeks.  If you have not heard from us then please contact us. The fastest way to get your results is to register for My Chart. ° ° °IF you received an x-ray today, you will receive an invoice from [facility]. Please contact [facility] at [phone] with questions or concerns regarding your invoice.  ° °IF you received labwork today, you will receive an invoice from [facility]. Please contact [facility] at [phone] with questions or concerns regarding your invoice.  ° °Our billing staff will not be able to assist you with questions regarding bills from these companies. ° °You will be contacted with the lab results as soon as they are available. The fastest way to get your results is to activate your My Chart account. Instructions are located on the last page of this paperwork. If you have not heard from us regarding the results in 2 weeks, please contact this office. °  ° ° ° °

## 2019-07-26 NOTE — Progress Notes (Signed)
Acute Office Visit  Subjective:    Patient ID: Lauren Potter, female    DOB: [birth year], 29 y.o.   MRN: [id number]  Chief Complaint  Patient presents with  . GI Problem    patient states that her stomach has been bothering her since january but starting to get worse that she can only Lay down . per patient when she makes a bowel movement it look like it is mixed with mucus, and eating makes her feel like she has to vomit. Also sometimes the bottom of her feet turn gray and feels like no blood is circulating .    HPI Patient is in today for abdominal pain and steatorrhea.  Ongoing 2 mos Intermittent - no clear triggers No constipation or blood in stool Needs to lie down with pain at times. Nausea after eating, but no vomiting at this time Feels like she has poor circulation in lower extremities  Unsure of etiology. No history of these symptoms. No hx of anemia or GI abnormalities.   Past Medical History:  Diagnosis Date  . Frequent sinus infections   . Medical history non-contributory     Past Surgical History:  Procedure Laterality Date  . APPENDECTOMY      Family History  Problem Relation Age of Onset  . Diabetes Mother   . Hypertension Father   . Diabetes Maternal Grandmother   . Diabetes Maternal Grandfather   . Hypertension Paternal Grandmother     Social History   Socioeconomic History  . Marital status: Married    Spouse name: Not on file  . Number of children: Not on file  . Years of education: Not on file  . Highest education level: Not on file  Occupational History  . Not on file  Tobacco Use  . Smoking status: Never Smoker  . Smokeless tobacco: Never Used  Substance and Sexual Activity  . Alcohol use: No  . Drug use: No  . Sexual activity: Yes    Partners: Male    Birth control/protection: I.U.D.  Other Topics Concern  . Not on file  Social History Narrative  . Not on file   Social Determinants of Health   Financial Resource  Strain:   . Difficulty of Paying Living Expenses:   Food Insecurity:   . Worried About Charity fundraiser in the Last Year:   . Arboriculturist in the Last Year:   Transportation Needs:   . Film/video editor (Medical):   Marland Kitchen Lack of Transportation (Non-Medical):   Physical Activity:   . Days of Exercise per Week:   . Minutes of Exercise per Session:   Stress:   . Feeling of Stress :   Social Connections:   . Frequency of Communication with Friends and Family:   . Frequency of Social Gatherings with Friends and Family:   . Attends Religious Services:   . Active Member of Clubs or Organizations:   . Attends Archivist Meetings:   Marland Kitchen Marital Status:   Intimate Partner Violence:   . Fear of Current or Ex-Partner:   . Emotionally Abused:   Marland Kitchen Physically Abused:   . Sexually Abused:     Outpatient Medications Prior to Visit  Medication Sig Dispense Refill  . ferrous sulfate (FERROUSUL) 325 (65 FE) MG tablet Take 1 tablet (325 mg total) by mouth 2 (two) times daily with a meal. (Patient not taking: Reported on 07/26/2019) 180 tablet 1  . omeprazole (PRILOSEC) 20 MG capsule  Take 1 capsule (20 mg total) by mouth daily. (Patient not taking: Reported on 07/26/2019) 60 capsule 0   No facility-administered medications prior to visit.    Allergies  Allergen Reactions  . Other     Review of Systems  Constitutional: Negative.   HENT: Negative.   Eyes: Negative.   Respiratory: Negative.   Cardiovascular: Negative.   Gastrointestinal: Positive for abdominal pain, diarrhea and nausea. Negative for abdominal distention, anal bleeding, blood in stool, constipation, rectal pain and vomiting.  Endocrine: Negative.   Genitourinary: Negative.   Musculoskeletal: Negative.   Skin: Negative.   Allergic/Immunologic: Negative.   Neurological: Negative.   Hematological: Negative.   Psychiatric/Behavioral: Negative.   All other systems reviewed and are negative.      Objective:      Physical Exam Vitals and nursing note reviewed.  Constitutional:      General: She is not in acute distress.    Appearance: Normal appearance. She is normal weight. She is not ill-appearing, toxic-appearing or diaphoretic.  Cardiovascular:     Rate and Rhythm: Normal rate and regular rhythm.  Pulmonary:     Effort: Pulmonary effort is normal. No respiratory distress.  Abdominal:     General: Abdomen is flat. Bowel sounds are normal. There is no distension.     Palpations: Abdomen is soft. There is no mass.     Tenderness: There is abdominal tenderness (generalized). There is no right CVA tenderness, left CVA tenderness, guarding or rebound.     Hernia: No hernia is present.  Skin:    General: Skin is warm and dry.     Capillary Refill: Capillary refill takes less than 2 seconds.     Coloration: Skin is not jaundiced or pale.     Findings: No bruising, erythema, lesion or rash.  Neurological:     General: No focal deficit present.     Mental Status: She is alert and oriented to person, place, and time. Mental status is at baseline.     Cranial Nerves: No cranial nerve deficit.  Psychiatric:        Mood and Affect: Mood normal.        Behavior: Behavior normal.        Thought Content: Thought content normal.        Judgment: Judgment normal.     BP 120/79   Pulse 62   Temp 98 F (36.7 C) (Temporal)   Ht 5\' 7"  (1.702 m)   Wt 194 lb 9.6 oz (88.3 kg)   LMP 07/07/2019   SpO2 100%   BMI 30.48 kg/m  Wt Readings from Last 3 Encounters:  07/26/19 194 lb 9.6 oz (88.3 kg)  03/28/19 190 lb (86.2 kg)  12/28/18 195 lb (88.5 kg)    There are no preventive care reminders to display for this patient.  There are no preventive care reminders to display for this patient.   Lab Results  Component Value Date   TSH 1.720 12/28/2018   Lab Results  Component Value Date   WBC 3.1 (L) 03/28/2019   HGB 11.8 03/28/2019   HCT 37.0 03/28/2019   MCV 73 (L) 03/28/2019   PLT 157  03/28/2019   Lab Results  Component Value Date   NA 138 12/28/2018   K 4.3 12/28/2018   CO2 21 12/28/2018   GLUCOSE 89 12/28/2018   BUN 9 12/28/2018   CREATININE 0.78 12/28/2018   BILITOT 0.4 12/28/2018   ALKPHOS 35 (L) 12/28/2018   AST 14  12/28/2018   ALT 8 12/28/2018   PROT 7.3 12/28/2018   ALBUMIN 4.6 12/28/2018   CALCIUM 9.4 12/28/2018   Lab Results  Component Value Date   CHOL 151 12/28/2018   Lab Results  Component Value Date   HDL 60 12/28/2018   Lab Results  Component Value Date   LDLCALC 84 12/28/2018   Lab Results  Component Value Date   TRIG 34 12/28/2018   Lab Results  Component Value Date   CHOLHDL 2.5 12/28/2018   Lab Results  Component Value Date   HGBA1C 5.5 12/28/2018       Assessment & Plan:   Problem List Items Addressed This Visit    None    Visit Diagnoses    Steatorrhea    -  Primary   Relevant Orders   CBC With Differential   Comprehensive metabolic panel   TSH   Hemoglobin A1c   ANA   Left upper quadrant abdominal pain       Relevant Orders   CBC With Differential   Comprehensive metabolic panel   TSH   Hemoglobin A1c   ANA       No orders of the defined types were placed in this encounter.  PLAN  Nonspecific findings on exam  Will refer to GI for further work up if labs are not determinate of etiology  Patient encouraged to call clinic with any questions, comments, or concerns.   Janeece Agee, NP

## 2019-07-27 LAB — CBC WITH DIFFERENTIAL
Basophils Absolute: 0 10*3/uL (ref 0.0–0.2)
Basos: 0 %
EOS (ABSOLUTE): 0 10*3/uL (ref 0.0–0.4)
Eos: 1 %
Hematocrit: 39.1 % (ref 34.0–46.6)
Hemoglobin: 12 g/dL (ref 11.1–15.9)
Immature Grans (Abs): 0 10*3/uL (ref 0.0–0.1)
Immature Granulocytes: 0 %
Lymphocytes Absolute: 1.7 10*3/uL (ref 0.7–3.1)
Lymphs: 54 %
MCH: 23.5 pg — ABNORMAL LOW (ref 26.6–33.0)
MCHC: 30.7 g/dL — ABNORMAL LOW (ref 31.5–35.7)
MCV: 77 fL — ABNORMAL LOW (ref 79–97)
Monocytes Absolute: 0.2 10*3/uL (ref 0.1–0.9)
Monocytes: 6 %
Neutrophils Absolute: 1.3 10*3/uL — ABNORMAL LOW (ref 1.4–7.0)
Neutrophils: 39 %
RBC: 5.11 x10E6/uL (ref 3.77–5.28)
RDW: 13.5 % (ref 11.7–15.4)
WBC: 3.2 10*3/uL — ABNORMAL LOW (ref 3.4–10.8)

## 2019-07-27 LAB — COMPREHENSIVE METABOLIC PANEL
ALT: 10 IU/L (ref 0–32)
AST: 12 IU/L (ref 0–40)
Albumin/Globulin Ratio: 1.6 (ref 1.2–2.2)
Albumin: 4.4 g/dL (ref 3.9–5.0)
Alkaline Phosphatase: 32 IU/L — ABNORMAL LOW (ref 39–117)
BUN/Creatinine Ratio: 11 (ref 9–23)
BUN: 7 mg/dL (ref 6–20)
Bilirubin Total: 0.4 mg/dL (ref 0.0–1.2)
CO2: 22 mmol/L (ref 20–29)
Calcium: 9.4 mg/dL (ref 8.7–10.2)
Chloride: 103 mmol/L (ref 96–106)
Creatinine, Ser: 0.66 mg/dL (ref 0.57–1.00)
GFR calc Af Amer: 140 mL/min/{1.73_m2} (ref >59)
GFR calc non Af Amer: 121 mL/min/{1.73_m2} (ref >59)
Globulin, Total: 2.7 g/dL (ref 1.5–4.5)
Glucose: 83 mg/dL (ref 65–99)
Potassium: 4.1 mmol/L (ref 3.5–5.2)
Sodium: 139 mmol/L (ref 134–144)
Total Protein: 7.1 g/dL (ref 6.0–8.5)

## 2019-07-27 LAB — HEMOGLOBIN A1C
Est. average glucose Bld gHb Est-mCnc: 108 mg/dL
Hgb A1c MFr Bld: 5.4 % (ref 4.8–5.6)

## 2019-07-27 LAB — ANA: Anti Nuclear Antibody (ANA): NEGATIVE

## 2019-07-27 LAB — TSH: TSH: 1.71 u[IU]/mL (ref 0.450–4.500)

## 2019-08-02 ENCOUNTER — Encounter: Payer: Self-pay | Admitting: Registered Nurse

## 2019-08-04 ENCOUNTER — Encounter: Payer: Self-pay | Admitting: Registered Nurse

## 2019-08-08 NOTE — Telephone Encounter (Signed)
Pt wants to know all abnormalities on recent blood work so that he can work on the other slight abnormalities as well can you advise what she should work on?

## 2019-08-17 ENCOUNTER — Encounter: Payer: Self-pay | Admitting: Registered Nurse

## 2019-08-17 ENCOUNTER — Other Ambulatory Visit: Payer: Self-pay | Admitting: Registered Nurse

## 2019-08-17 DIAGNOSIS — K909 Intestinal malabsorption, unspecified: Secondary | ICD-10-CM

## 2019-08-22 ENCOUNTER — Encounter: Payer: Self-pay | Admitting: Gastroenterology

## 2019-09-22 ENCOUNTER — Other Ambulatory Visit (INDEPENDENT_AMBULATORY_CARE_PROVIDER_SITE_OTHER): Payer: BC Managed Care – PPO

## 2019-09-22 ENCOUNTER — Ambulatory Visit: Payer: BC Managed Care – PPO | Admitting: Gastroenterology

## 2019-09-22 ENCOUNTER — Encounter: Payer: Self-pay | Admitting: Gastroenterology

## 2019-09-22 VITALS — BP 100/64 | HR 67 | Ht 67.0 in | Wt 195.0 lb

## 2019-09-22 DIAGNOSIS — Z8379 Family history of other diseases of the digestive system: Secondary | ICD-10-CM

## 2019-09-22 DIAGNOSIS — R152 Fecal urgency: Secondary | ICD-10-CM

## 2019-09-22 DIAGNOSIS — R112 Nausea with vomiting, unspecified: Secondary | ICD-10-CM

## 2019-09-22 DIAGNOSIS — R195 Other fecal abnormalities: Secondary | ICD-10-CM

## 2019-09-22 DIAGNOSIS — Z862 Personal history of diseases of the blood and blood-forming organs and certain disorders involving the immune mechanism: Secondary | ICD-10-CM

## 2019-09-22 DIAGNOSIS — R194 Change in bowel habit: Secondary | ICD-10-CM

## 2019-09-22 DIAGNOSIS — Z01818 Encounter for other preprocedural examination: Secondary | ICD-10-CM

## 2019-09-22 DIAGNOSIS — K5909 Other constipation: Secondary | ICD-10-CM | POA: Diagnosis not present

## 2019-09-22 LAB — IGA: IgA: 94 mg/dL (ref 68–378)

## 2019-09-22 LAB — HIGH SENSITIVITY CRP: CRP, High Sensitivity: 1.43 mg/L (ref 0.000–5.000)

## 2019-09-22 LAB — CBC
HCT: 37.9 % (ref 36.0–46.0)
Hemoglobin: 12.3 g/dL (ref 12.0–15.0)
MCHC: 32.5 g/dL (ref 30.0–36.0)
MCV: 73 fl — ABNORMAL LOW (ref 78.0–100.0)
Platelets: 170 10*3/uL (ref 150.0–400.0)
RBC: 5.2 Mil/uL — ABNORMAL HIGH (ref 3.87–5.11)
RDW: 14.1 % (ref 11.5–15.5)
WBC: 3.8 10*3/uL — ABNORMAL LOW (ref 4.0–10.5)

## 2019-09-22 LAB — IBC + FERRITIN
Ferritin: 8.9 ng/mL — ABNORMAL LOW (ref 10.0–291.0)
Iron: 70 ug/dL (ref 42–145)
Saturation Ratios: 15 % — ABNORMAL LOW (ref 20.0–50.0)
Transferrin: 333 mg/dL (ref 212.0–360.0)

## 2019-09-22 LAB — TSH: TSH: 1.54 u[IU]/mL (ref 0.35–4.50)

## 2019-09-22 LAB — SEDIMENTATION RATE: Sed Rate: 21 mm/hr — ABNORMAL HIGH (ref 0–20)

## 2019-09-22 LAB — CORTISOL: Cortisol, Plasma: 5.7 ug/dL

## 2019-09-22 MED ORDER — ESOMEPRAZOLE MAGNESIUM 40 MG PO CPDR: 40.0000 mg | DELAYED_RELEASE_CAPSULE | Freq: Every day | ORAL | 2 refills | Status: DC

## 2019-09-22 MED ORDER — ONDANSETRON HCL 4 MG PO TABS
4.0000 mg | ORAL_TABLET | Freq: Three times a day (TID) | ORAL | 1 refills | Status: DC | PRN
Start: 2019-09-22 — End: 2020-04-30

## 2019-09-22 MED ORDER — SUPREP BOWEL PREP KIT 17.5-3.13-1.6 GM/177ML PO SOLN
1.0000 | ORAL | 0 refills | Status: DC
Start: 2019-09-22 — End: 2020-04-30

## 2019-09-22 NOTE — Progress Notes (Addendum)
[facility] VISIT   Primary Care Provider Maximiano Coss, NP [place] [address] [phone]  Referring Provider Maximiano Coss, NP [place],  [place] [address] [phone]  Patient Profile: Lauren Potter is a 29 y.o. female with a pmh significant for chronic constipation.  The patient presents to the [facility] for an evaluation and management of problem(s) noted below:  Problem List 1. Change in bowel habits   2. Mucous in stools   3. Fecal urgency   4. Chronic constipation   5. Non-intractable vomiting with nausea, unspecified vomiting type   6. History of iron deficiency anemia   7. Family history of chronic constipation   8. Preop examination     History of Present Illness This is the patient's first visit to the outpatient [place] clinic.  The patient states that for the last few months she has been experiencing abdominal pain.  The discomfort she has been experiencing has mostly been postprandial and occurs approximately 15 to 30 minutes after she eats the majority of the time.  Although, there are times where she will have pain out of nowhere and not have any recent food ingestion.  Along with this burning and sharp pain that occurs in the mid abdomen without significant radiation, she will at times have bloating.  She cooks with palm oil and all of oil most frequently.  Over the course the last 6 months or so she has had issues of significant nausea that occur multiple times per week.  She has vomited in the past but not all the time.  She trialed omeprazole for 2 months and after a few days of thinking it was helpful it did not help and she stopped taking this.  She chronically has constipation for years and multiple family members of her also deal with constipation.  Her bowel habits are normally once every couple of days.  However she has had more recent changes in her bowel habits.  She is seeing  mucus and has had fecal urgency at times.  She has noted no blood (melena/hematochezia/maroon stool).  Patient has never had an upper or lower endoscopy.  She does not take significant nonsteroidals.  GI Review of Systems Positive as above Negative for pyrosis, dysphagia, odynophagia  Review of Systems General: Denies fevers/chills/weight loss HEENT: Denies oral lesions Cardiovascular: Denies chest pain/palpitations Pulmonary: Denies shortness of breath/cough Gastroenterological: See HPI Genitourinary: Denies darkened urine Hematological: Denies easy bruising/bleeding Endocrine: Denies temperature intolerance Dermatological: Denies jaundice Psychological: Mood is stable   Medications Current Outpatient Medications  Medication Sig Dispense Refill  . ferrous sulfate (FERROUSUL) 325 (65 FE) MG tablet Take 1 tablet (325 mg total) by mouth 2 (two) times daily with a meal. 180 tablet 1  . esomeprazole (NEXIUM) 40 MG capsule Take 1 capsule (40 mg total) by mouth daily before breakfast. 30 capsule 2  . Na Sulfate-K Sulfate-Mg Sulf (SUPREP BOWEL PREP KIT) 17.5-3.13-1.6 GM/177ML SOLN Take 1 kit by mouth as directed. For colonoscopy prep 354 mL 0  . ondansetron (ZOFRAN) 4 MG tablet Take 1 tablet (4 mg total) by mouth every 8 (eight) hours as needed for nausea or vomiting. 14 tablet 1   No current facility-administered medications for this visit.    Allergies Allergies  Allergen Reactions  . Other     Histories Past Medical History:  Diagnosis Date  . Frequent sinus infections   . Medical history non-contributory    Past Surgical History:  Procedure Laterality Date  . APPENDECTOMY     Social History   Socioeconomic History  . Marital status: Married    Spouse name: Not on file  . Number of children: Not on file  . Years of education: Not on file  . Highest education level: Not on file  Occupational History  . Not on file  Tobacco Use  . Smoking status: Never Smoker  .  Smokeless tobacco: Never Used  Substance and Sexual Activity  . Alcohol use: No  . Drug use: No  . Sexual activity: Yes    Partners: Male    Birth control/protection: I.U.D.  Other Topics Concern  . Not on file  Social History Narrative  . Not on file   Social Determinants of Health   Financial Resource Strain:   . Difficulty of Paying Living Expenses:   Food Insecurity:   . Worried About Charity fundraiser in the Last Year:   . Arboriculturist in the Last Year:   Transportation Needs:   . Film/video editor (Medical):   Marland Kitchen Lack of Transportation (Non-Medical):   Physical Activity:   . Days of Exercise per Week:   . Minutes of Exercise per Session:   Stress:   . Feeling of Stress :   Social Connections:   . Frequency of Communication with Friends and Family:   . Frequency of Social Gatherings with Friends and Family:   . Attends Religious Services:   . Active Member of Clubs or Organizations:   . Attends Archivist Meetings:   Marland Kitchen Marital Status:   Intimate Partner Violence:   . Fear of Current or Ex-Partner:   . Emotionally Abused:   Marland Kitchen Physically Abused:   . Sexually Abused:    Family History  Problem Relation Age of Onset  . Diabetes Mother   . Hypertension Father   . Diabetes Maternal Grandmother   . Diabetes Maternal Grandfather   . Hypertension Paternal Grandmother   . Colon cancer Neg Hx   . Esophageal cancer Neg Hx   . Inflammatory bowel disease Neg Hx   . Liver disease Neg Hx   . Pancreatic cancer Neg Hx   . Rectal cancer Neg Hx   . Stomach cancer Neg Hx    I have reviewed her medical, social, and family history in detail and updated the electronic medical record as necessary.    PHYSICAL EXAMINATION  BP 100/64   Pulse 67   Ht _0  (1.702 m)   Wt 195 lb (88.5 kg)   BMI 30.54 kg/m  Wt Readings from Last 3 Encounters:  09/22/19 195 lb (88.5 kg)  07/26/19 194 lb 9.6 oz (88.3 kg)  03/28/19 190 lb (86.2 kg)  GEN: NAD, appears stated  age, doesn't appear chronically ill PSYCH: Cooperative, without pressured speech EYE: Conjunctivae pink, sclerae anicteric ENT: MMM, without oral ulcers, no erythema or exudates noted NECK: Supple CV: RR without R/Gs  RESP: CTAB posteriorly, without wheezing GI: NABS, soft, NT/ND, without rebound or guarding, no HSM appreciated MSK/EXT: No significant lower extremity edema SKIN: No jaundice NEURO:  Alert & Oriented x 3, no focal deficits   REVIEW OF DATA  I reviewed the following data at the time of this encounter:  GI Procedures and Studies  No relevant studies to review  Laboratory Studies  Reviewed those in epic  Imaging Studies  No relevant studies to review   ASSESSMENT  Ms. Kaus is a 29 y.o. female with  a pmh significant for chronic constipation.  The patient is seen today for evaluation and management of:  1. Change in bowel habits   2. Mucous in stools   3. Fecal urgency   4. Chronic constipation   5. Non-intractable vomiting with nausea, unspecified vomiting type   6. History of iron deficiency anemia   7. Family history of chronic constipation   8. Preop examination    The patient is hemodynamically stable.  The etiology of her symptoms is not clearly defined solely on her history today.  We will try to rule out etiologies laboratory evaluation as well as imaging the abdominal ultrasound.  She will move forward with diagnostic endoscopy as well most likely.  If her H. pylori stool antigen returned positive then we may hold on diagnostic endoscopy for a bit.  We will check inflammatory markers to ensure that in the setting of her mucus that she is noted that there is no evidence of IBD although her history of chronic constipation suggests this to be less likely.  Diagnostic colonoscopy will be helpful as well and we will move forward with this unless we find that she has H. pylori infection and would plan on treating that first.  We will transition her PPI to Nexium  once daily and see if that makes any difference at a higher dose.  The risks and benefits of endoscopic evaluation were discussed with the patient; these include but are not limited to the risk of perforation, infection, bleeding, missed lesions, lack of diagnosis, severe illness requiring hospitalization, as well as anesthesia and sedation related illnesses.  The patient is agreeable to proceed.  A hematology evaluation in regards to her history of microcytic anemia previously, and iron deficiency is something that I think would be helpful and the patient agrees with this we will move forward with placing a referral.  All patient questions were answered, to the best of my ability, and the patient agrees to the aforementioned plan of action with follow-up as indicated.   PLAN  Laboratories as outlined below H. pylori stool antigen to be obtained After H. pylori stool antigen has been given then may initiate Nexium 40 mg daily Zofran 4 mg ODT as needed every 8 hours Abdominal ultrasound to be obtained Diagnostic endoscopy (at least gastric and duodenal biopsies to be obtained) Diagnostic colonoscopy (with possible biopsies) Hematology referral for evaluation of iron deficiency, microcytosis to rule out underlying thalassemia   Orders Placed This Encounter  Procedures  . Helicobacter pylori special antigen  . SARS Coronavirus 2 ([facility] ONLY)  . US Abdomen Complete  . CBC  . TSH  . Cortisol  . Sedimentation rate  . CRP High sensitivity  . IBC + Ferritin  . IgA  . Tissue transglutaminase, IgA  . Ambulatory referral to Gastroenterology  . Ambulatory referral to Hematology / Oncology    New Prescriptions   ESOMEPRAZOLE (NEXIUM) 40 MG CAPSULE    Take 1 capsule (40 mg total) by mouth daily before breakfast.   NA SULFATE-K SULFATE-MG SULF (SUPREP BOWEL PREP KIT) 17.5-3.13-1.6 GM/177ML SOLN    Take 1 kit by mouth as directed. For colonoscopy prep   ONDANSETRON (ZOFRAN) 4 MG TABLET     Take 1 tablet (4 mg total) by mouth every 8 (eight) hours as needed for nausea or vomiting.   Modified Medications   No medications on file    Planned Follow Up No follow-ups on file.   Total Time in Face-to-Face and in  Coordination of Care for patient including independent/personal interpretation/review of prior testing, medical history, examination, medication adjustment, communicating results with the patient directly, and documentation with the EHR is 45 minutes.   Justice Britain, MD [facility]oscopy Office # [phone]

## 2019-09-22 NOTE — Patient Instructions (Addendum)
You have been scheduled for an endoscopy and colonoscopy. Please follow the written instructions given to you at your visit today. Please pick up your prep supplies at the pharmacy within the next 1-3 days. If you use inhalers (even only as needed), please bring them with you on the day of your procedure.  Your provider has requested that you go to the basement level for lab work before leaving today. Press "B" on the elevator. The lab is located at the first door on the left as you exit the elevator.  We have sent the following medications to your pharmacy for you to pick up at your convenience: Nexium , Zofran   -DO NOT START NEXIUM UNTIL YOU HAVE COMPLETED STOOL STUDY.  You have been scheduled for an abdominal ultrasound at [facility] (1st floor of hospital) on 09/30/19 at 8:30am. Please arrive 15 minutes prior to your appointment for registration. Make certain not to have anything to eat or drink 6 hours prior to your appointment. Should you need to reschedule your appointment, please contact radiology at [phone]. This test typically takes about 30 minutes to perform.   Referral has been sent to Hematology for history of IDA. Their office will contact you with an appointment.   If you are age 70 or older, your body mass index should be between 23-30. Your Body mass index is 30.54 kg/m. If this is out of the aforementioned range listed, please consider follow up with your Primary Care Provider.  If you are age 28 or younger, your body mass index should be between 19-25. Your Body mass index is 30.54 kg/m. If this is out of the aformentioned range listed, please consider follow up with your Primary Care Provider.    Due to recent changes in healthcare laws, you may see the results of your imaging and laboratory studies on MyChart before your provider has had a chance to review them.  We understand that in some cases there may be results that are confusing or concerning to you.  Not all laboratory results come back in the same time frame and the provider may be waiting for multiple results in order to interpret others.  Please give [place] 48 hours in order for your provider to thoroughly review all the results before contacting the office for clarification of your results.   Thank you for choosing me and [facility].  Dr. Meridee Score

## 2019-09-23 ENCOUNTER — Other Ambulatory Visit: Payer: BC Managed Care – PPO

## 2019-09-23 DIAGNOSIS — Z862 Personal history of diseases of the blood and blood-forming organs and certain disorders involving the immune mechanism: Secondary | ICD-10-CM

## 2019-09-23 DIAGNOSIS — R195 Other fecal abnormalities: Secondary | ICD-10-CM | POA: Diagnosis not present

## 2019-09-23 DIAGNOSIS — R112 Nausea with vomiting, unspecified: Secondary | ICD-10-CM

## 2019-09-23 DIAGNOSIS — R152 Fecal urgency: Secondary | ICD-10-CM

## 2019-09-23 LAB — TISSUE TRANSGLUTAMINASE, IGA: (tTG) Ab, IgA: 1 U/mL

## 2019-09-25 ENCOUNTER — Encounter: Payer: Self-pay | Admitting: Gastroenterology

## 2019-09-25 DIAGNOSIS — R195 Other fecal abnormalities: Secondary | ICD-10-CM | POA: Insufficient documentation

## 2019-09-25 DIAGNOSIS — K5909 Other constipation: Secondary | ICD-10-CM | POA: Insufficient documentation

## 2019-09-25 DIAGNOSIS — Z01818 Encounter for other preprocedural examination: Secondary | ICD-10-CM | POA: Insufficient documentation

## 2019-09-25 DIAGNOSIS — Z862 Personal history of diseases of the blood and blood-forming organs and certain disorders involving the immune mechanism: Secondary | ICD-10-CM | POA: Insufficient documentation

## 2019-09-25 DIAGNOSIS — Z8379 Family history of other diseases of the digestive system: Secondary | ICD-10-CM | POA: Insufficient documentation

## 2019-09-25 DIAGNOSIS — R194 Change in bowel habit: Secondary | ICD-10-CM | POA: Insufficient documentation

## 2019-09-25 DIAGNOSIS — R1110 Vomiting, unspecified: Secondary | ICD-10-CM | POA: Insufficient documentation

## 2019-09-25 DIAGNOSIS — R152 Fecal urgency: Secondary | ICD-10-CM | POA: Insufficient documentation

## 2019-09-26 LAB — HELICOBACTER PYLORI  SPECIAL ANTIGEN
MICRO NUMBER:: 10506225
RESULT:: DETECTED — AB
SPECIMEN QUALITY: ADEQUATE

## 2019-09-27 ENCOUNTER — Other Ambulatory Visit: Payer: Self-pay

## 2019-09-27 DIAGNOSIS — A048 Other specified bacterial intestinal infections: Secondary | ICD-10-CM

## 2019-09-27 DIAGNOSIS — R194 Change in bowel habit: Secondary | ICD-10-CM

## 2019-09-27 MED ORDER — METRONIDAZOLE 250 MG PO TABS: 500.0000 mg | ORAL_TABLET | Freq: Four times a day (QID) | ORAL | 0 refills | Status: AC

## 2019-09-27 MED ORDER — CLARITHROMYCIN 500 MG PO TABS: 500.0000 mg | ORAL_TABLET | Freq: Two times a day (BID) | ORAL | 0 refills | Status: AC

## 2019-09-27 MED ORDER — AMOXICILLIN 500 MG PO TABS: 1000.0000 mg | ORAL_TABLET | Freq: Two times a day (BID) | ORAL | 0 refills | Status: AC

## 2019-09-30 ENCOUNTER — Ambulatory Visit (HOSPITAL_COMMUNITY): Payer: BC Managed Care – PPO

## 2019-10-04 ENCOUNTER — Telehealth: Payer: Self-pay | Admitting: Gastroenterology

## 2019-10-04 NOTE — Telephone Encounter (Signed)
Communicated via My Chart with pt.  She sent a My Chart message today

## 2019-10-04 NOTE — Telephone Encounter (Signed)
Patient called states the antibiotics are not working for her. She is having to go to the bathroom every hour and she said she is dizzy. Requesting a letter for work until she can finish her treatment with meds.

## 2019-10-05 NOTE — Telephone Encounter (Signed)
Certainly is not usual to have as many side-effects as are being explained, but I think we know she needs the treatment to hopefully eradicate the H. Pylori. OK for a letter to her workplace while patient is taking the 10-days of therapy. Thank you. GM 

## 2019-10-06 ENCOUNTER — Ambulatory Visit (HOSPITAL_COMMUNITY)
Admission: RE | Admit: 2019-10-06 | Discharge: 2019-10-06 | Disposition: A | Discharge status: Home / Self Care | Payer: BC Managed Care – PPO | Source: Ambulatory Visit | Attending: Gastroenterology | Admitting: Gastroenterology

## 2019-10-06 ENCOUNTER — Other Ambulatory Visit: Payer: Self-pay

## 2019-10-06 ENCOUNTER — Telehealth: Payer: Self-pay | Admitting: Hematology and Oncology

## 2019-10-06 ENCOUNTER — Telehealth: Payer: Self-pay

## 2019-10-06 DIAGNOSIS — R112 Nausea with vomiting, unspecified: Secondary | ICD-10-CM | POA: Insufficient documentation

## 2019-10-06 DIAGNOSIS — R195 Other fecal abnormalities: Secondary | ICD-10-CM | POA: Insufficient documentation

## 2019-10-06 DIAGNOSIS — R152 Fecal urgency: Secondary | ICD-10-CM | POA: Diagnosis not present

## 2019-10-06 DIAGNOSIS — Z862 Personal history of diseases of the blood and blood-forming organs and certain disorders involving the immune mechanism: Secondary | ICD-10-CM | POA: Diagnosis not present

## 2019-10-06 DIAGNOSIS — R1010 Upper abdominal pain, unspecified: Secondary | ICD-10-CM | POA: Diagnosis not present

## 2019-10-06 NOTE — Telephone Encounter (Signed)
Received a new hem referral from Dr. Meridee Potter for hx of IDA and microcytosis. Ms. Lauren Potter has been cld and scheduled to see Dr. Leonides Potter on 6/17 at 9am. Pt aware to arrive 15 minutes early.

## 2019-10-06 NOTE — Telephone Encounter (Signed)
Certainly is not usual to have as many side-effects as are being explained, but I think we know she needs the treatment to hopefully eradicate the H. Pylori. OK for a letter to her workplace while patient is taking the 10-days of therapy. Thank you. GM

## 2019-10-07 NOTE — Telephone Encounter (Signed)
Sent a message to the pt via My Chart to see how many days left she has of H pylori treatment. We need to know for the letter for her employer.

## 2019-10-19 ENCOUNTER — Telehealth: Payer: Self-pay | Admitting: Hematology and Oncology

## 2019-10-19 NOTE — Telephone Encounter (Signed)
Received a call from Ms. Erbe to reschedule her appt w/Dr. Leonides Schanz to 7/12 at 9am. Pt aware to arrive 15 minutes early.

## 2019-10-20 ENCOUNTER — Inpatient Hospital Stay: Payer: BC Managed Care – PPO | Admitting: Hematology and Oncology

## 2019-10-20 ENCOUNTER — Inpatient Hospital Stay: Payer: BC Managed Care – PPO

## 2019-11-13 NOTE — Progress Notes (Signed)
[place] [phone]   Fax:([phone]) [place] NOTE  Patient Care Team: Maximiano Coss, NP as PCP - General (Adult Health Nurse Practitioner) Patient, [name] (General Practice)  Hematological/Oncological History # Microcytosis  1) 08/08/2014: WBC 6.5, Hgb 12.1, MCV 69.4, Plt 167 2) 07/26/2016: WBC 5.0, Hgb 10.4, MCV 64, Plt 235 3) 12/30/2016: WBC 5.8, Hgb 11.3, MCV 71, Plt 165 4) 05/13/2017: WBC 5.7, Hgb 12.0, MCV 71.5, Plt 122 5) 07/26/2019: WBC 3.2, Hgb 12.0, MCV 77 6) 09/22/2019: WBC 3.8, Hgb 12.3, MCV 73, Plt 170. Iron 70, Sat 15%, Ferritin 8.9.  7) 11/14/2019: establish care with Dr. Lorenso Courier   CHIEF COMPLAINTS/PURPOSE OF CONSULTATION:  "Microcytosis "  HISTORY OF PRESENTING ILLNESS:  Lauren Potter 29 y.o. female with medical history significant for chronic constipation and mucus in her stool who presents for evaluation of longstanding microcytosis.   On review of the previous records Lauren Potter has a longstanding microcytosis dating back to at least 08/08/2014.  At that time she was noted to have a hemoglobin of 12.1, and MCV of 69.4 with a normal platelet count.  Subsequently throughout 2018 through 2021 the patient has had near normal hemoglobins with decreased MCVs.  Most recently on 09/22/2019 the patient was found to have a hemoglobin 12.3, MCV of 70, and a platelet of 170.  At that time she was noted to have a serum iron of 70, iron sat of 15%, and a serum ferritin of 8.9.  Due to concern for this longstanding history of microcytosis and iron deficiency anemia she was referred to hematology for further evaluation and management.  On exam today is Lauren Potter notes that she has a longstanding history of iron deficiency anemia.  She notes that this was initially a problem in 2016 with the delivery of her first son.  She notes that she was taking iron therapy during the pregnancy, however after the pregnancy she "did not take it is serious" and did  not take as much iron.  She also reports that she was placed on iron therapy again in 2018 during her second pregnancy and again did not continue thereafter because of the issues was causing heartburn.  Further discussion she notes that the primary symptom she has include fatigue, headache, and some hair loss.  She notes that she was recently referred to Dr. Rush Landmark for GI evaluation and was found to be H. Pylori.  Positive and was subsequently started on antibiotic therapy for this.  She reports that she completed this therapy approximately 3 weeks ago.  She reports that she has been taking iron therapy for at least the last several months and that she does not always take the iron pills daily.  She reports that she is scheduled for twice daily but tends to take them Monday through Friday as listed on her desk.  She does have continued fatigue and headache but only has occasional shortness of breath and she associates that with her increase in weight and her lack of exercise during the pandemic.  She currently denies having any issues with fevers, chills, sweats, nausea, vomiting or other overt signs of bleeding.  A full 10 point ROS is the blood.  MEDICAL HISTORY:  Past Medical History:  Diagnosis Date  . Frequent sinus infections   . Medical history non-contributory     SURGICAL HISTORY: Past Surgical History:  Procedure Laterality Date  . APPENDECTOMY      SOCIAL HISTORY: Social History   Socioeconomic  History  . Marital status: Married    Spouse name: Not on file  . Number of children: Not on file  . Years of education: Not on file  . Highest education level: Not on file  Occupational History  . Not on file  Tobacco Use  . Smoking status: Never Smoker  . Smokeless tobacco: Never Used  Vaping Use  . Vaping Use: Never used  Substance and Sexual Activity  . Alcohol use: No  . Drug use: No  . Sexual activity: Yes    Partners: Male    Birth control/protection: I.U.D.    Other Topics Concern  . Not on file  Social History Narrative  . Not on file   Social Determinants of Health   Financial Resource Strain:   . Difficulty of Paying Living Expenses:   Food Insecurity:   . Worried About Charity fundraiser in the Last Year:   . Arboriculturist in the Last Year:   Transportation Needs:   . Film/video editor (Medical):   Marland Kitchen Lack of Transportation (Non-Medical):   Physical Activity:   . Days of Exercise per Week:   . Minutes of Exercise per Session:   Stress:   . Feeling of Stress :   Social Connections:   . Frequency of Communication with Friends and Family:   . Frequency of Social Gatherings with Friends and Family:   . Attends Religious Services:   . Active Member of Clubs or Organizations:   . Attends Archivist Meetings:   Marland Kitchen Marital Status:   Intimate Partner Violence:   . Fear of Current or Ex-Partner:   . Emotionally Abused:   Marland Kitchen Physically Abused:   . Sexually Abused:     FAMILY HISTORY: Family History  Problem Relation Age of Onset  . Diabetes Mother   . Hypertension Father   . Diabetes Maternal Grandmother   . Diabetes Maternal Grandfather   . Hypertension Paternal Grandmother   . Colon cancer Neg Hx   . Esophageal cancer Neg Hx   . Inflammatory bowel disease Neg Hx   . Liver disease Neg Hx   . Pancreatic cancer Neg Hx   . Rectal cancer Neg Hx   . Stomach cancer Neg Hx     ALLERGIES:  is allergic to other.  MEDICATIONS:  Current Outpatient Medications  Medication Sig Dispense Refill  . esomeprazole (NEXIUM) 40 MG capsule Take 1 capsule (40 mg total) by mouth daily before breakfast. 30 capsule 2  . ferrous sulfate (FERROUSUL) 325 (65 FE) MG tablet Take 1 tablet (325 mg total) by mouth 2 (two) times daily with a meal. 180 tablet 1  . Na Sulfate-K Sulfate-Mg Sulf (SUPREP BOWEL PREP KIT) 17.5-3.13-1.6 GM/177ML SOLN Take 1 kit by mouth as directed. For colonoscopy prep 354 mL 0  . ondansetron (ZOFRAN) 4 MG  tablet Take 1 tablet (4 mg total) by mouth every 8 (eight) hours as needed for nausea or vomiting. (Patient not taking: Reported on 11/14/2019) 14 tablet 1   No current facility-administered medications for this visit.    REVIEW OF SYSTEMS:   Constitutional: ( - ) fevers, ( - )  chills , ( - ) night sweats Eyes: ( - ) blurriness of vision, ( - ) double vision, ( - ) watery eyes Ears, nose, mouth, throat, and face: ( - ) mucositis, ( - ) sore throat Respiratory: ( - ) cough, ( - ) dyspnea, ( - ) wheezes Cardiovascular: ( - )  palpitation, ( - ) chest discomfort, ( - ) lower extremity swelling Gastrointestinal:  ( - ) nausea, ( - ) heartburn, ( - ) change in bowel habits Skin: ( - ) abnormal skin rashes Lymphatics: ( - ) new lymphadenopathy, ( - ) easy bruising Neurological: ( - ) numbness, ( - ) tingling, ( - ) new weaknesses Behavioral/Psych: ( - ) mood change, ( - ) new changes  All other systems were reviewed with the patient and are negative.  PHYSICAL EXAMINATION: ECOG PERFORMANCE STATUS: 1 - Symptomatic but completely ambulatory  Vitals:   11/14/19 0927  BP: 115/61  Pulse: 62  Resp: 17  Temp: 97.6 F (36.4 C)  SpO2: 100%   Filed Weights   11/14/19 0927  Weight: 203 lb 1.6 oz (92.1 kg)    GENERAL: well appearing young African American female in NAD  SKIN: skin color, texture, turgor are normal, no rashes or significant lesions EYES: conjunctiva are pink and non-injected, sclera clear LUNGS: clear to auscultation and percussion with normal breathing effort HEART: regular rate & rhythm and no murmurs and no lower extremity edema Musculoskeletal: no cyanosis of digits and no clubbing  PSYCH: alert & oriented x 3, fluent speech NEURO: no focal motor/sensory deficits  LABORATORY DATA:  I have reviewed the data as listed CBC Latest Ref Rng & Units 09/22/2019 07/26/2019 03/28/2019  WBC 4.0 - 10.5 K/uL 3.8(L) 3.2(L) 3.1(L)  Hemoglobin 12.0 - 15.0 g/dL 12.3 12.0 11.8    Hematocrit 36 - 46 % 37.9 39.1 37.0  Platelets 150 - 400 K/uL 170.0 - 157    CMP Latest Ref Rng & Units 07/26/2019 12/28/2018 08/01/2016  Glucose 65 - 99 mg/dL 83 89 87  BUN 6 - 20 mg/dL '7 9 11  ' Creatinine 0.57 - 1.00 mg/dL 0.66 0.78 0.85  Sodium 134 - 144 mmol/L 139 138 138  Potassium 3.5 - 5.2 mmol/L 4.1 4.3 4.1  Chloride 96 - 106 mmol/L 103 104 99  CO2 20 - 29 mmol/L '22 21 22  ' Calcium 8.7 - 10.2 mg/dL 9.4 9.4 9.4  Total Protein 6.0 - 8.5 g/dL 7.1 7.3 7.4  Total Bilirubin 0.0 - 1.2 mg/dL 0.4 0.4 <0.2  Alkaline Phos 39 - 117 IU/L 32(L) 35(L) 34(L)  AST 0 - 40 IU/L '12 14 15  ' ALT 0 - 32 IU/L '10 8 8    ' RADIOGRAPHIC STUDIES: No results found.  ASSESSMENT & PLAN Lauren Potter 29 y.o. female with medical history significant for chronic constipation and mucus in her stool who presents for evaluation of longstanding microcytosis.  After review the labs, review the prior records, discussion with the patient the findings are consistent with a longstanding microcytosis which could have numerous possible etiologies.  The patient does have a clear history of iron deficiency anemia and on last lab check in May the patient clearly had ferritin less than 30 as well as a decreased iron sat.  Given these findings it is definitely likely that iron deficiency plays a role in the patient's microcytosis.  However given the chronicity and the fact that it remains low despite the patient having normal hemoglobin I think is quite possible that there is also a hemoglobinopathy involved and therefore I think it be reasonable to perform a hemoglobinopathy panel to help rule out sickle cell variants and thalassemia's.  In terms of possible causes for the patient's iron deficiency anemia she is a young female menstruating age, though her menstrual cycles do not appear to be that  heavy.  Additionally she did have a recent H. pylori infection which was adequately treated with antibiotic therapy.  H pylori can cause  iron deficiency through direct blood loss as well as malabsorption of the iron.  At this time would recommend that the patient continue her p.o. iron therapy but I would recommend that she take it either daily or every other day rather than twice daily.  She is also been taking it with dairy products which decrease the absorption and therefore I recommend that she take it with a source of vitamin C such as orange juice.  There is no clear indication for IV iron at this time as the patient has a normal hemoglobin and has minimal symptoms.  In the event the patient were to fail to improve on p.o. iron or if there were to be acute worsening of her hemoglobin we would happily reconsider the administration of IV iron.  We will recheck these labs today to assure that she is improving after completion of H. pylori therapy.  # Microcytosis # History of Iron Deficiency   --given the long history of microcytosis there may be a component of thalassemia or other hemoglobinopathy causing this finding. Will order a Hemoglobinopathy panel today.  --additionally will collect iron panel, ferritin, CBC, and reticulocyte panel --continue PO iron ferrous sulfate 33m daily (or every other day if patient is not tolerating iron) with a source of vitamin C --the use of IV iron in the setting of iron deficiency without anemia is controversial, but could be considered if patient has the inability to tolerate PO iron therapy. --RTC in 3 months to re-evaluate or sooner if further workup is required.   Orders Placed This Encounter  Procedures  . CBC with Differential (Cancer Center Only)    Standing Status:   Future    Number of Occurrences:   1    Standing Expiration Date:   11/13/2020  . Retic Panel    Standing Status:   Future    Number of Occurrences:   1    Standing Expiration Date:   11/13/2020  . Save Smear (SSMR)    Standing Status:   Future    Number of Occurrences:   1    Standing Expiration Date:   11/13/2020    . CMP ([facility])    Standing Status:   Future    Number of Occurrences:   1    Standing Expiration Date:   11/13/2020  . Lactate dehydrogenase (LDH)    Standing Status:   Future    Number of Occurrences:   1    Standing Expiration Date:   11/13/2020  . Iron and TIBC    Standing Status:   Future    Number of Occurrences:   1    Standing Expiration Date:   11/13/2020  . Ferritin    Standing Status:   Future    Number of Occurrences:   1    Standing Expiration Date:   11/13/2020  . Hgb Fractionation Cascade    Standing Status:   Future    Number of Occurrences:   1    Standing Expiration Date:   11/13/2020    All questions were answered. The patient knows to call the clinic with any problems, questions or concerns.  A total of more than 60 minutes were spent on this encounter and over half of that time was spent on counseling and coordination of care as outlined above.   JJenny Reichmann  Lauretta Grill, MD Department of Hematology/Oncology [place] [facility] Phone: [phone] Pager: [phone] Email: [email or web address]  11/14/2019 10:31 AM

## 2019-11-14 ENCOUNTER — Inpatient Hospital Stay: Payer: BC Managed Care – PPO | Attending: Hematology and Oncology | Admitting: Hematology and Oncology

## 2019-11-14 ENCOUNTER — Other Ambulatory Visit: Payer: Self-pay

## 2019-11-14 ENCOUNTER — Inpatient Hospital Stay: Payer: BC Managed Care – PPO

## 2019-11-14 ENCOUNTER — Encounter: Payer: Self-pay | Admitting: Hematology and Oncology

## 2019-11-14 VITALS — BP 115/61 | HR 62 | Temp 97.6°F | Resp 17 | Ht 67.0 in | Wt 203.1 lb

## 2019-11-14 DIAGNOSIS — R718 Other abnormality of red blood cells: Secondary | ICD-10-CM | POA: Diagnosis not present

## 2019-11-14 DIAGNOSIS — Z975 Presence of (intrauterine) contraceptive device: Secondary | ICD-10-CM | POA: Diagnosis not present

## 2019-11-14 DIAGNOSIS — A048 Other specified bacterial intestinal infections: Secondary | ICD-10-CM

## 2019-11-14 DIAGNOSIS — Z862 Personal history of diseases of the blood and blood-forming organs and certain disorders involving the immune mechanism: Secondary | ICD-10-CM | POA: Diagnosis not present

## 2019-11-14 DIAGNOSIS — D500 Iron deficiency anemia secondary to blood loss (chronic): Secondary | ICD-10-CM

## 2019-11-14 LAB — IRON AND TIBC
Iron: 59 ug/dL (ref 41–142)
Saturation Ratios: 19 % — ABNORMAL LOW (ref 21–57)
TIBC: 309 ug/dL (ref 236–444)
UIBC: 250 ug/dL (ref 120–384)

## 2019-11-14 LAB — CBC WITH DIFFERENTIAL (CANCER CENTER ONLY)
Abs Immature Granulocytes: 0 10*3/uL (ref 0.00–0.07)
Basophils Absolute: 0 10*3/uL (ref 0.0–0.1)
Basophils Relative: 0 %
Eosinophils Absolute: 0.1 10*3/uL (ref 0.0–0.5)
Eosinophils Relative: 2 %
HCT: 40.4 % (ref 36.0–46.0)
Hemoglobin: 12.7 g/dL (ref 12.0–15.0)
Immature Granulocytes: 0 %
Lymphocytes Relative: 52 %
Lymphs Abs: 1.6 10*3/uL (ref 0.7–4.0)
MCH: 23.8 pg — ABNORMAL LOW (ref 26.0–34.0)
MCHC: 31.4 g/dL (ref 30.0–36.0)
MCV: 75.8 fL — ABNORMAL LOW (ref 80.0–100.0)
Monocytes Absolute: 0.4 10*3/uL (ref 0.1–1.0)
Monocytes Relative: 13 %
Neutro Abs: 1 10*3/uL — ABNORMAL LOW (ref 1.7–7.7)
Neutrophils Relative %: 33 %
Platelet Count: 149 10*3/uL — ABNORMAL LOW (ref 150–400)
RBC: 5.33 MIL/uL — ABNORMAL HIGH (ref 3.87–5.11)
RDW: 14.9 % (ref 11.5–15.5)
WBC Count: 3 10*3/uL — ABNORMAL LOW (ref 4.0–10.5)
nRBC: 0 % (ref 0.0–0.2)

## 2019-11-14 LAB — CMP (CANCER CENTER ONLY)
ALT: 23 U/L (ref 0–44)
AST: 18 U/L (ref 15–41)
Albumin: 4.1 g/dL (ref 3.5–5.0)
Alkaline Phosphatase: 30 U/L — ABNORMAL LOW (ref 38–126)
Anion gap: 8 (ref 5–15)
BUN: 10 mg/dL (ref 6–20)
CO2: 25 mmol/L (ref 22–32)
Calcium: 10 mg/dL (ref 8.9–10.3)
Chloride: 109 mmol/L (ref 98–111)
Creatinine: 0.78 mg/dL (ref 0.44–1.00)
GFR, Est AFR Am: >60 mL/min (ref >60)
GFR, Estimated: >60 mL/min (ref >60)
Glucose, Bld: 96 mg/dL (ref 70–99)
Potassium: 4.7 mmol/L (ref 3.5–5.1)
Sodium: 142 mmol/L (ref 135–145)
Total Bilirubin: 0.3 mg/dL (ref 0.3–1.2)
Total Protein: 7.8 g/dL (ref 6.5–8.1)

## 2019-11-14 LAB — SAVE SMEAR(SSMR), FOR PROVIDER SLIDE REVIEW

## 2019-11-14 LAB — RETIC PANEL
Immature Retic Fract: 10.9 % (ref 2.3–15.9)
RBC.: 5.36 MIL/uL — ABNORMAL HIGH (ref 3.87–5.11)
Retic Count, Absolute: 70.8 10*3/uL (ref 19.0–186.0)
Retic Ct Pct: 1.3 % (ref 0.4–3.1)
Reticulocyte Hemoglobin: 27.9 pg — ABNORMAL LOW (ref >27.9)

## 2019-11-14 LAB — LACTATE DEHYDROGENASE: LDH: 139 U/L (ref 98–192)

## 2019-11-14 LAB — FERRITIN: Ferritin: 36 ng/mL (ref 11–307)

## 2019-11-15 ENCOUNTER — Telehealth: Payer: Self-pay | Admitting: Hematology and Oncology

## 2019-11-15 NOTE — Telephone Encounter (Signed)
Scheduled per 7/12 los. Unable to reach pt. Left voicemail with appt time and date. 

## 2019-11-16 LAB — HGB FRACTIONATION CASCADE
Hgb A2: 2.7 % (ref 1.8–3.2)
Hgb A: 97.3 % (ref 96.4–98.8)
Hgb F: 0 % (ref 0.0–2.0)
Hgb S: 0 %

## 2019-11-21 ENCOUNTER — Encounter: Payer: Self-pay | Admitting: Gastroenterology

## 2019-11-25 ENCOUNTER — Telehealth: Payer: Self-pay | Admitting: *Deleted

## 2019-11-25 NOTE — Telephone Encounter (Signed)
TCT patient regarding lab results. No answer but able to leave detailed  vm message on identified phone # that pt has iron deficiency without anemia. Dr. Leonides Schanz recommends she continue her oral iron tablets and we will see her back in 3 months. Provided date and time of next appt.

## 2019-11-25 NOTE — Telephone Encounter (Signed)
-----   Message from Jaci Standard, MD sent at 11/21/2019  4:50 PM EDT ----- Please let Lauren Potter know that we found no evidence of a disorder of her Hgb. Her findings are most consistent with iron deficiency in the absence of anemia. Recommend continued PO iron therapy. We will see her back in about 3 months time.   ----- Message ----- From: Leory Plowman, [facility] [place] Sent: 11/14/2019  10:32 AM EDT To: Jaci Standard, MD

## 2019-12-02 ENCOUNTER — Encounter: Payer: BC Managed Care – PPO | Admitting: Gastroenterology

## 2020-01-20 ENCOUNTER — Ambulatory Visit: Payer: BC Managed Care – PPO | Admitting: Gastroenterology

## 2020-02-15 ENCOUNTER — Emergency Department (HOSPITAL_COMMUNITY)
Admission: EM | Admit: 2020-02-15 | Discharge: 2020-02-15 | Disposition: A | Discharge status: Home / Self Care | Payer: BC Managed Care – PPO | Attending: Emergency Medicine | Admitting: Emergency Medicine

## 2020-02-15 ENCOUNTER — Other Ambulatory Visit: Payer: Self-pay | Admitting: Hematology and Oncology

## 2020-02-15 ENCOUNTER — Emergency Department (HOSPITAL_COMMUNITY): Payer: BC Managed Care – PPO

## 2020-02-15 ENCOUNTER — Inpatient Hospital Stay: Payer: BC Managed Care – PPO | Admitting: Hematology and Oncology

## 2020-02-15 ENCOUNTER — Encounter (HOSPITAL_COMMUNITY): Payer: Self-pay | Admitting: Emergency Medicine

## 2020-02-15 ENCOUNTER — Inpatient Hospital Stay: Payer: BC Managed Care – PPO

## 2020-02-15 DIAGNOSIS — D500 Iron deficiency anemia secondary to blood loss (chronic): Secondary | ICD-10-CM

## 2020-02-15 DIAGNOSIS — M545 Low back pain, unspecified: Secondary | ICD-10-CM | POA: Diagnosis not present

## 2020-02-15 DIAGNOSIS — X58XXXA Exposure to other specified factors, initial encounter: Secondary | ICD-10-CM | POA: Insufficient documentation

## 2020-02-15 DIAGNOSIS — S39012A Strain of muscle, fascia and tendon of lower back, initial encounter: Secondary | ICD-10-CM | POA: Insufficient documentation

## 2020-02-15 LAB — POC URINE PREG, ED: Preg Test, Ur: NEGATIVE

## 2020-02-15 MED ORDER — NAPROXEN 500 MG PO TABS: 500.0000 mg | ORAL_TABLET | Freq: Two times a day (BID) | ORAL | 0 refills | Status: DC

## 2020-02-15 MED ORDER — METHOCARBAMOL 500 MG PO TABS: 500.0000 mg | ORAL_TABLET | Freq: Two times a day (BID) | ORAL | 0 refills | Status: DC

## 2020-02-15 NOTE — ED Notes (Signed)
Patient transported to x-ray. ?

## 2020-02-15 NOTE — ED Provider Notes (Signed)
[facility] EMERGENCY DEPARTMENT Provider Note   CSN: [id number] Arrival date & time: 02/15/20  1001     History Chief Complaint  Patient presents with  . Back Pain    Lauren Potter is a 29 y.o. female presenting to the ED with a chief complaint of left lower back pain.  For the past 3 to 4 weeks has been having intermittent sharp pain in her left lower back.  2 days ago pain got a lot worse.  1 dose of Tylenol that she took yesterday with only minimal improvement in her symptoms and allowed her to sleep.  Pain is worse with movement, palpation and laying down.  She cannot recall any injury or trauma.  No urinary symptoms or possibility of pregnancy.  No chest pain, shortness of breath, prior back surgeries, numbness in arms or legs, loss of bowel or bladder function.  Her last dose of Tylenol was yesterday.  HPI     Past Medical History:  Diagnosis Date  . Frequent sinus infections   . Medical history non-contributory     Patient Active Problem List   Diagnosis Date Noted  . Mucous in stools 09/25/2019  . Fecal urgency 09/25/2019  . History of iron deficiency anemia 09/25/2019  . Preop examination 09/25/2019  . Non-intractable vomiting 09/25/2019  . Change in bowel habits 09/25/2019  . Chronic constipation 09/25/2019  . Family history of chronic constipation 09/25/2019  . 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 Fatigue 12/28/2018  . Indigestion 12/28/2018  . Encounter for IUD insertion 06/24/2017    Past Surgical History:  Procedure Laterality Date  . APPENDECTOMY       OB History    Gravida  3   Para  2   Term  2   Preterm      AB  1   Living  2     SAB      TAB      Ectopic      Multiple  0   Live Births  2           Family History  Problem Relation Age of Onset  . Diabetes Mother   . Hypertension Father   . Diabetes Maternal Grandmother   . Diabetes Maternal Grandfather   .  Hypertension Paternal Grandmother   . Colon cancer Neg Hx   . Esophageal cancer Neg Hx   . Inflammatory bowel disease Neg Hx   . Liver disease Neg Hx   . Pancreatic cancer Neg Hx   . Rectal cancer Neg Hx   . Stomach cancer Neg Hx     Social History   Tobacco Use  . Smoking status: Never Smoker  . Smokeless tobacco: Never Used  Vaping Use  . Vaping Use: Never used  Substance Use Topics  . Alcohol use: No  . Drug use: No    Home Medications Prior to Admission medications   Medication Sig Start Date End Date Taking? Authorizing Provider  esomeprazole (NEXIUM) 40 MG capsule Take 1 capsule (40 mg total) by mouth daily before breakfast. 09/22/19   Mansouraty, Telford Nab., MD  ferrous sulfate (FERROUSUL) 325 (65 FE) MG tablet Take 1 tablet (325 mg total) by mouth 2 (two) times daily with a meal. 12/29/18   Maximiano Coss, NP  methocarbamol (ROBAXIN) 500 MG tablet Take 1 tablet (500 mg total) by mouth 2 (two) times daily. 02/15/20   Vanderbilt Ranieri, PA-C  Na Sulfate-K Sulfate-Mg  Sulf (SUPREP BOWEL PREP KIT) 17.5-3.13-1.6 GM/177ML SOLN Take 1 kit by mouth as directed. For colonoscopy prep 09/22/19   Mansouraty, Telford Nab., MD  naproxen (NAPROSYN) 500 MG tablet Take 1 tablet (500 mg total) by mouth 2 (two) times daily. 02/15/20   Breindel Collier, PA-C  ondansetron (ZOFRAN) 4 MG tablet Take 1 tablet (4 mg total) by mouth every 8 (eight) hours as needed for nausea or vomiting. Patient not taking: Reported on 11/14/2019 09/22/19   Mansouraty, Telford Nab., MD    Allergies    Other  Review of Systems   Review of Systems  Constitutional: Negative for fever.  Respiratory: Negative for shortness of breath.   Cardiovascular: Negative for chest pain.  Genitourinary: Negative for dysuria.  Musculoskeletal: Positive for back pain and myalgias.  Neurological: Negative for weakness and numbness.    Physical Exam Updated Vital Signs BP 116/80 (BP Location: Left Arm)   Pulse 88   Temp 98.3 F (36.8  C) (Oral)   Resp 19   Ht _0  (1.702 m)   Wt 93 kg   SpO2 100%   BMI 32.11 kg/m   Physical Exam Vitals and nursing note reviewed.  Constitutional:      General: She is not in acute distress.    Appearance: She is well-developed. She is not diaphoretic.  HENT:     Head: Normocephalic and atraumatic.  Eyes:     General: No scleral icterus.    Conjunctiva/sclera: Conjunctivae normal.  Pulmonary:     Effort: Pulmonary effort is normal. No respiratory distress.  Musculoskeletal:     Cervical back: Normal range of motion.     Lumbar back: Tenderness present.       Back:     Comments: No midline spinal tenderness present in lumbar, thoracic or cervical spine. No step-off palpated. No visible bruising, edema or temperature change noted. No objective signs of numbness present. No saddle anesthesia. 2+ DP pulses bilaterally. Sensation intact to light touch. Strength 5/5 in bilateral lower extremities.  Skin:    Findings: No rash.  Neurological:     Mental Status: She is alert.     ED Results / Procedures / Treatments   Labs (all labs ordered are listed, but only abnormal results are displayed) Labs Reviewed  POC URINE PREG, ED    EKG None  Radiology DG Lumbar Spine Complete  Result Date: 02/15/2020 CLINICAL DATA:  Low back pain EXAM: LUMBAR SPINE - COMPLETE 4+ VIEW COMPARISON:  2015 FINDINGS: Slight levocurvature. No pars break. Anteroposterior alignment is maintained. Vertebral body heights are preserved. There is no significant intervertebral disc space narrowing or facet hypertrophy. Intrauterine device is present. IMPRESSION: No significant abnormality or change since 2015. Electronically Signed   By: Macy Mis M.D.   On: 02/15/2020 13:09    Procedures Procedures (including critical care time)  Medications Ordered in ED Medications - No data to display  ED Course  I have reviewed the triage vital signs and the nursing notes.  Pertinent labs & imaging  results that were available during my care of the patient were reviewed by me and considered in my medical decision making (see chart for details).    MDM Rules/Calculators/A&P                          Patient denies any concerning symptoms suggestive of cauda equina requiring urgent imaging at this time such as loss of sensation in the lower extremities, lower  extremity weakness, loss of bowel or bladder control, saddle anesthesia, urinary retention, fever/chills, IVDU. Exam demonstrated no  weakness on exam today. No preceding injury or trauma to suggest acute fracture. Doubt pelvic or urinary pathology for patient's acute back pain, as patient denies urinary symptoms, has no vaginal discharge, and is not pregnant as evidenced by negative POC urine pregnancy test. Doubt AAA as cause of patient's back pain as patient lacks major risk factors, had no abdominal TTP, and has symmetric and intact distal pulses.  Patient requesting x-ray and this was negative for acute abnormality.  Suspect musculoskeletal cause of pain.  Will treat with NSAIDs and muscle relaxer.  She remains ambulatory here.  Patient given strict return precautions for any symptoms indicating worsening neurologic function in the lower extremities.   Patient is hemodynamically stable, in NAD, and able to ambulate in the ED. Evaluation does not show pathology that would require ongoing emergent intervention or inpatient treatment. I explained the diagnosis to the patient. Pain has been managed and has no complaints prior to discharge. Patient is comfortable with above plan and is stable for discharge at this time. All questions were answered prior to disposition. Strict return precautions for returning to the ED were discussed. Encouraged follow up with PCP.   An After Visit Summary was printed and given to the patient.   Portions of this note were generated with Lobbyist. Dictation errors may occur despite best attempts  at proofreading.  Final Clinical Impression(s) / ED Diagnoses Final diagnoses:  Strain of lumbar region, initial encounter    Rx / DC Orders ED Discharge Orders         Ordered    naproxen (NAPROSYN) 500 MG tablet  2 times daily        02/15/20 1328    methocarbamol (ROBAXIN) 500 MG tablet  2 times daily        02/15/20 Woodward, Geronimo, PA-C 02/15/20 Pyote, Tyrrell, DO 02/15/20 1357

## 2020-02-15 NOTE — Discharge Instructions (Signed)
Take medications to help with your symptoms. Apply heating pad, stretch and massage the area. Return to the ER if you start to experience worsening pain, chest pain, shortness of breath, injuries or falls, losing control of your bowels or bladder.

## 2020-02-15 NOTE — ED Triage Notes (Signed)
Pt reports L sided back pain for the past few months, states pain has gotten worse over the past few days, denies known injury.

## 2020-02-28 ENCOUNTER — Telehealth: Payer: Self-pay | Admitting: Registered Nurse

## 2020-02-28 NOTE — Telephone Encounter (Signed)
patients employer [employer] faxed over  disability paperwokj [employer] to be completed by provider / place paperwork in CMA/Provider mail bex art nurses station

## 2020-02-28 NOTE — Telephone Encounter (Signed)
Placed in [place] sign box

## 2020-03-02 ENCOUNTER — Ambulatory Visit: Payer: BC Managed Care – PPO | Admitting: Registered Nurse

## 2020-03-05 ENCOUNTER — Encounter: Payer: Self-pay | Admitting: Registered Nurse

## 2020-03-09 ENCOUNTER — Telehealth: Payer: Self-pay | Admitting: Registered Nurse

## 2020-03-09 NOTE — Telephone Encounter (Signed)
Received Disability paperwork for pt from [facility]. Paperwork is in providers box beside nurses station to fill out. Please advise.

## 2020-03-12 NOTE — Telephone Encounter (Signed)
Noted  

## 2020-03-15 ENCOUNTER — Inpatient Hospital Stay (HOSPITAL_BASED_OUTPATIENT_CLINIC_OR_DEPARTMENT_OTHER): Payer: BC Managed Care – PPO | Admitting: Hematology and Oncology

## 2020-03-15 ENCOUNTER — Other Ambulatory Visit: Payer: Self-pay

## 2020-03-15 ENCOUNTER — Other Ambulatory Visit: Payer: Self-pay | Admitting: Hematology and Oncology

## 2020-03-15 ENCOUNTER — Inpatient Hospital Stay: Payer: BC Managed Care – PPO | Attending: Hematology and Oncology

## 2020-03-15 VITALS — BP 97/65 | HR 67 | Temp 97.5°F | Resp 18 | Ht 67.0 in | Wt 196.5 lb

## 2020-03-15 DIAGNOSIS — D509 Iron deficiency anemia, unspecified: Secondary | ICD-10-CM | POA: Diagnosis not present

## 2020-03-15 DIAGNOSIS — D696 Thrombocytopenia, unspecified: Secondary | ICD-10-CM | POA: Diagnosis not present

## 2020-03-15 DIAGNOSIS — R718 Other abnormality of red blood cells: Secondary | ICD-10-CM | POA: Diagnosis not present

## 2020-03-15 DIAGNOSIS — D500 Iron deficiency anemia secondary to blood loss (chronic): Secondary | ICD-10-CM

## 2020-03-15 LAB — IRON AND TIBC
Iron: 61 ug/dL (ref 41–142)
Saturation Ratios: 22 % (ref 21–57)
TIBC: 271 ug/dL (ref 236–444)
UIBC: 211 ug/dL (ref 120–384)

## 2020-03-15 LAB — CMP (CANCER CENTER ONLY)
ALT: 13 U/L (ref 0–44)
AST: 14 U/L — ABNORMAL LOW (ref 15–41)
Albumin: 3.7 g/dL (ref 3.5–5.0)
Alkaline Phosphatase: 23 U/L — ABNORMAL LOW (ref 38–126)
Anion gap: 5 (ref 5–15)
BUN: 6 mg/dL (ref 6–20)
CO2: 28 mmol/L (ref 22–32)
Calcium: 9 mg/dL (ref 8.9–10.3)
Chloride: 109 mmol/L (ref 98–111)
Creatinine: 0.75 mg/dL (ref 0.44–1.00)
GFR, Estimated: >60 mL/min (ref >60)
Glucose, Bld: 98 mg/dL (ref 70–99)
Potassium: 4 mmol/L (ref 3.5–5.1)
Sodium: 142 mmol/L (ref 135–145)
Total Bilirubin: 0.3 mg/dL (ref 0.3–1.2)
Total Protein: 6.5 g/dL (ref 6.5–8.1)

## 2020-03-15 LAB — CBC WITH DIFFERENTIAL (CANCER CENTER ONLY)
Abs Immature Granulocytes: 0.01 10*3/uL (ref 0.00–0.07)
Basophils Absolute: 0 10*3/uL (ref 0.0–0.1)
Basophils Relative: 1 %
Eosinophils Absolute: 0.1 10*3/uL (ref 0.0–0.5)
Eosinophils Relative: 2 %
HCT: 36.2 % (ref 36.0–46.0)
Hemoglobin: 11.3 g/dL — ABNORMAL LOW (ref 12.0–15.0)
Immature Granulocytes: 0 %
Lymphocytes Relative: 58 %
Lymphs Abs: 1.8 10*3/uL (ref 0.7–4.0)
MCH: 23.5 pg — ABNORMAL LOW (ref 26.0–34.0)
MCHC: 31.2 g/dL (ref 30.0–36.0)
MCV: 75.4 fL — ABNORMAL LOW (ref 80.0–100.0)
Monocytes Absolute: 0.3 10*3/uL (ref 0.1–1.0)
Monocytes Relative: 9 %
Neutro Abs: 0.9 10*3/uL — ABNORMAL LOW (ref 1.7–7.7)
Neutrophils Relative %: 30 %
Platelet Count: 129 10*3/uL — ABNORMAL LOW (ref 150–400)
RBC: 4.8 MIL/uL (ref 3.87–5.11)
RDW: 14.3 % (ref 11.5–15.5)
WBC Count: 3.1 10*3/uL — ABNORMAL LOW (ref 4.0–10.5)
nRBC: 0 % (ref 0.0–0.2)

## 2020-03-15 LAB — FERRITIN: Ferritin: 40 ng/mL (ref 11–307)

## 2020-03-15 LAB — RETIC PANEL
Immature Retic Fract: 11.8 % (ref 2.3–15.9)
RBC.: 4.75 MIL/uL (ref 3.87–5.11)
Retic Count, Absolute: 48.5 10*3/uL (ref 19.0–186.0)
Retic Ct Pct: 1 % (ref 0.4–3.1)
Reticulocyte Hemoglobin: 28.3 pg (ref >27.9)

## 2020-03-15 LAB — SAVE SMEAR(SSMR), FOR PROVIDER SLIDE REVIEW

## 2020-03-21 ENCOUNTER — Encounter: Payer: Self-pay | Admitting: Hematology and Oncology

## 2020-03-21 NOTE — Progress Notes (Signed)
[facility] [phone]   [phone]  PROGRESS NOTE  Patient Care Team: Maximiano Coss, NP as PCP - General (Adult Health Nurse Practitioner) Patient, [name] (General Practice)  Hematological/Oncological History # Microcytosis  1) 08/08/2014: WBC 6.5, Hgb 12.1, MCV 69.4, Plt 167 2) 07/26/2016: WBC 5.0, Hgb 10.4, MCV 64, Plt 235 3) 12/30/2016: WBC 5.8, Hgb 11.3, MCV 71, Plt 165 4) 05/13/2017: WBC 5.7, Hgb 12.0, MCV 71.5, Plt 122 5) 07/26/2019: WBC 3.2, Hgb 12.0, MCV 77 6) 09/22/2019: WBC 3.8, Hgb 12.3, MCV 73, Plt 170. Iron 70, Sat 15%, Ferritin 8.9.  7) 11/14/2019: establish care with Dr. Lorenso Courier   Interval History:  Lauren Potter 29 y.o. female with medical history significant for microcytosis who presents for a follow up visit. The patient's last visit was on 11/14/2019 at which time she established care. In the interim since the last visit she has continued PO ferrous sulfate 350m daily.  On exam today Mrs. LCozinenotes not much is changed since her prior visit.  She notes that she remains tired and overwhelmed.  She notes that the iron pills have been well-tolerated and she takes them every day with vitamin C.  She does also report recently she has been "fasting for prior" and only eating fruits and vegetables as a part of her faith.  She notes that her menstrual cycles have been lighter in the interim and that they were heavier 3 months ago when we last talked.  She notes that the first 2 days have been "okay" and that the third day consists of spotting.  She notes that she does change the pads approximately every 2 hours for hygiene purposes.  Otherwise she is had no other overt signs of bleeding such as nosebleeds, or dark stools.  She currently denies having issues with fevers, chills, sweats, nausea, or diarrhea.  A full 10 point ROS is listed below.  MEDICAL HISTORY:  Past Medical History:  Diagnosis Date  . Frequent sinus infections   . Medical  history non-contributory     SURGICAL HISTORY: Past Surgical History:  Procedure Laterality Date  . APPENDECTOMY      SOCIAL HISTORY: Social History   Socioeconomic History  . Marital status: Married    Spouse name: Not on file  . Number of children: Not on file  . Years of education: Not on file  . Highest education level: Not on file  Occupational History  . Not on file  Tobacco Use  . Smoking status: Never Smoker  . Smokeless tobacco: Never Used  Vaping Use  . Vaping Use: Never used  Substance and Sexual Activity  . Alcohol use: No  . Drug use: No  . Sexual activity: Yes    Partners: Male    Birth control/protection: I.U.D.  Other Topics Concern  . Not on file  Social History Narrative  . Not on file   Social Determinants of Health   Financial Resource Strain:   . Difficulty of Paying Living Expenses: Not on file  Food Insecurity:   . Worried About RCharity fundraiserin the Last Year: Not on file  . Ran Out of Food in the Last Year: Not on file  Transportation Needs:   . Lack of Transportation (Medical): Not on file  . Lack of Transportation (Non-Medical): Not on file  Physical Activity:   . Days of Exercise per Week: Not on file  . Minutes of Exercise per Session: Not on file  Stress:   . Feeling of Stress : Not on file  Social Connections:   . Frequency of Communication with Friends and Family: Not on file  . Frequency of Social Gatherings with Friends and Family: Not on file  . Attends Religious Services: Not on file  . Active Member of Clubs or Organizations: Not on file  . Attends Archivist Meetings: Not on file  . Marital Status: Not on file  Intimate Partner Violence:   . Fear of Current or Ex-Partner: Not on file  . Emotionally Abused: Not on file  . Physically Abused: Not on file  . Sexually Abused: Not on file    FAMILY HISTORY: Family History  Problem Relation Age of Onset  . Diabetes Mother   . Hypertension Father   .  Diabetes Maternal Grandmother   . Diabetes Maternal Grandfather   . Hypertension Paternal Grandmother   . Colon cancer Neg Hx   . Esophageal cancer Neg Hx   . Inflammatory bowel disease Neg Hx   . Liver disease Neg Hx   . Pancreatic cancer Neg Hx   . Rectal cancer Neg Hx   . Stomach cancer Neg Hx     ALLERGIES:  is allergic to other.  MEDICATIONS:  Current Outpatient Medications  Medication Sig Dispense Refill  . ferrous sulfate (FERROUSUL) 325 (65 FE) MG tablet Take 1 tablet (325 mg total) by mouth 2 (two) times daily with a meal. 180 tablet 1  . methocarbamol (ROBAXIN) 500 MG tablet Take 1 tablet (500 mg total) by mouth 2 (two) times daily. 20 tablet 0  . Na Sulfate-K Sulfate-Mg Sulf (SUPREP BOWEL PREP KIT) 17.5-3.13-1.6 GM/177ML SOLN Take 1 kit by mouth as directed. For colonoscopy prep 354 mL 0  . naproxen (NAPROSYN) 500 MG tablet Take 1 tablet (500 mg total) by mouth 2 (two) times daily. 30 tablet 0  . ondansetron (ZOFRAN) 4 MG tablet Take 1 tablet (4 mg total) by mouth every 8 (eight) hours as needed for nausea or vomiting. (Patient not taking: Reported on 11/14/2019) 14 tablet 1   No current facility-administered medications for this visit.    REVIEW OF SYSTEMS:   Constitutional: ( - ) fevers, ( - )  chills , ( - ) night sweats Eyes: ( - ) blurriness of vision, ( - ) double vision, ( - ) watery eyes Ears, nose, mouth, throat, and face: ( - ) mucositis, ( - ) sore throat Respiratory: ( - ) cough, ( - ) dyspnea, ( - ) wheezes Cardiovascular: ( - ) palpitation, ( - ) chest discomfort, ( - ) lower extremity swelling Gastrointestinal:  ( - ) nausea, ( - ) heartburn, ( - ) change in bowel habits Skin: ( - ) abnormal skin rashes Lymphatics: ( - ) new lymphadenopathy, ( - ) easy bruising Neurological: ( - ) numbness, ( - ) tingling, ( - ) new weaknesses Behavioral/Psych: ( - ) mood change, ( - ) new changes  All other systems were reviewed with the patient and are  negative.  PHYSICAL EXAMINATION:  Vitals:   03/15/20 0834  BP: 97/65  Pulse: 67  Resp: 18  Temp: (!) 97.5 F (36.4 C)  SpO2: 100%   Filed Weights   03/15/20 0834  Weight: 196 lb 8 oz (89.1 kg)    GENERAL: well appearing young African American female in NAD  SKIN: skin color, texture, turgor are normal, no rashes or significant lesions EYES: conjunctiva are pink and non-injected, sclera clear  LUNGS: clear to auscultation and percussion with normal breathing effort HEART: regular rate & rhythm and no murmurs and no lower extremity edema Musculoskeletal: no cyanosis of digits and no clubbing  PSYCH: alert & oriented x 3, fluent speech NEURO: no focal motor/sensory deficits  LABORATORY DATA:  I have reviewed the data as listed CBC Latest Ref Rng & Units 03/15/2020 11/14/2019 09/22/2019  WBC 4.0 - 10.5 K/uL 3.1(L) 3.0(L) 3.8(L)  Hemoglobin 12.0 - 15.0 g/dL 11.3(L) 12.7 12.3  Hematocrit 36 - 46 % 36.2 40.4 37.9  Platelets 150 - 400 K/uL 129(L) 149(L) 170.0    CMP Latest Ref Rng & Units 03/15/2020 11/14/2019 07/26/2019  Glucose 70 - 99 mg/dL 98 96 83  BUN 6 - 20 mg/dL '6 10 7  ' Creatinine 0.44 - 1.00 mg/dL 0.75 0.78 0.66  Sodium 135 - 145 mmol/L 142 142 139  Potassium 3.5 - 5.1 mmol/L 4.0 4.7 4.1  Chloride 98 - 111 mmol/L 109 109 103  CO2 22 - 32 mmol/L '28 25 22  ' Calcium 8.9 - 10.3 mg/dL 9.0 10.0 9.4  Total Protein 6.5 - 8.1 g/dL 6.5 7.8 7.1  Total Bilirubin 0.3 - 1.2 mg/dL 0.3 0.3 0.4  Alkaline Phos 38 - 126 U/L 23(L) 30(L) 32(L)  AST 15 - 41 U/L 14(L) 18 12  ALT 0 - 44 U/L '13 23 10   ' RADIOGRAPHIC STUDIES: No results found.  ASSESSMENT & PLAN Lauren Potter 29 y.o. female with medical history significant for microcytosis who presents for a follow up visit.  After review the labs, the records, discussion with the patient the findings most consistent with a mild iron deficiency anemia and microcytosis.  The patient has been taking p.o. iron therapy without much of a boost  in her energy level.  Her hemoglobin has dropped to 11.3 from 12.7 at our last visit.  She is also become more thrombocytopenic in the meantime.  Overall I recommend that we continue with the p.o. iron therapy and if no improvement is seen in her counts with a recheck in 3 month's time we consider ministration of IV iron.   In the event this is ineffective we would need to consider bone marrow biopsy, though pulmonary dysfunction causing microcytosis is relatively uncommon finding.  # Microcytosis # History of Iron Deficiency   -- long history of microcytosis but Hgb cascade showed no evidence of  thalassemia or other hemoglobinopathy causing this finding. --prior iron panel, ferritin show borderline low iron levels.  --continue PO iron ferrous sulfate 348m daily (or every other day if patient is not tolerating iron) with a source of vitamin C --the use of IV iron in the setting of iron deficiency without anemia is controversial, but could be considered if patient has the inability to tolerate PO iron therapy. --RTC in 3 months to re-evaluate or sooner if further workup is required.   No orders of the defined types were placed in this encounter.   All questions were answered. The patient knows to call the clinic with any problems, questions or concerns.  A total of more than 30 minutes were spent on this encounter and over half of that time was spent on counseling and coordination of care as outlined above.   JLedell Peoples MD Department of Hematology/Oncology CMount [facility]at [facility] CenterPhone: [phone]: [phone]: [email or web address]  03/21/2020 4:24 PM

## 2020-03-22 ENCOUNTER — Other Ambulatory Visit: Payer: Self-pay | Admitting: Hematology and Oncology

## 2020-03-22 ENCOUNTER — Telehealth: Payer: Self-pay | Admitting: Hematology and Oncology

## 2020-03-22 NOTE — Telephone Encounter (Signed)
Scheduled appt per 11/18 sch msg - pt is aware of apts for iron infusion

## 2020-03-23 ENCOUNTER — Other Ambulatory Visit: Payer: Self-pay

## 2020-03-23 ENCOUNTER — Encounter: Payer: Self-pay | Admitting: Registered Nurse

## 2020-03-23 ENCOUNTER — Ambulatory Visit (INDEPENDENT_AMBULATORY_CARE_PROVIDER_SITE_OTHER): Payer: BC Managed Care – PPO | Admitting: Registered Nurse

## 2020-03-23 VITALS — BP 133/82 | HR 80 | Temp 98.0°F | Resp 18 | Ht 67.0 in | Wt 192.8 lb

## 2020-03-23 DIAGNOSIS — F321 Major depressive disorder, single episode, moderate: Secondary | ICD-10-CM

## 2020-03-23 DIAGNOSIS — G479 Sleep disorder, unspecified: Secondary | ICD-10-CM | POA: Diagnosis not present

## 2020-03-23 MED ORDER — SERTRALINE HCL 25 MG PO TABS: ORAL_TABLET | ORAL | 0 refills | Status: DC

## 2020-03-23 MED ORDER — TRAZODONE HCL 50 MG PO TABS: 25.0000 mg | ORAL_TABLET | Freq: Every evening | ORAL | 3 refills | Status: DC | PRN

## 2020-03-23 MED ORDER — SERTRALINE HCL 100 MG PO TABS: 100.0000 mg | ORAL_TABLET | Freq: Every day | ORAL | 0 refills | Status: DC

## 2020-03-23 NOTE — Progress Notes (Signed)
Established Patient Office Visit  Subjective:  Patient ID: Lauren Potter, female    DOB: 05-Aug-1990  Age: 29 y.o. MRN: [id number]  CC:  Chief Complaint  Patient presents with  . Depression    Patient states she has been having some issues with BP and does not know if its due to the Depression and Anxiety she is going through. PHQ9=10 Gad7=10. Patient states she has not been able to sleep and is starting to worry because she has never felt like this before , and has two sons she have to be okay for.,    HPI Lauren Potter presents for depression.  Has felt this way for a number of months, but it is worsening. Feeling down, disinterested, and withdrawn from things she usually enjoys doing. Having trouble with day to day tasks and taking care of herself. It seems to be all she can do to take care of her two sons. She has noticed that this has affected every aspect of her work. Does not feel she can capably continue without a leave of absence. She cannot fall asleep and when she does she cannot stay asleep. Formerly she had been a very sound sleeper.   Denies HI/SI. No guns in home. No passive SI. Has good support at home and feels safe.  Past Medical History:  Diagnosis Date  . Frequent sinus infections   . Medical history non-contributory     Past Surgical History:  Procedure Laterality Date  . APPENDECTOMY      Family History  Problem Relation Age of Onset  . Diabetes Mother   . Hypertension Father   . Diabetes Maternal Grandmother   . Diabetes Maternal Grandfather   . Hypertension Paternal Grandmother   . Colon cancer Neg Hx   . Esophageal cancer Neg Hx   . Inflammatory bowel disease Neg Hx   . Liver disease Neg Hx   . Pancreatic cancer Neg Hx   . Rectal cancer Neg Hx   . Stomach cancer Neg Hx     Social History   Socioeconomic History  . Marital status: Married    Spouse name: Not on file  . Number of children: Not on file  . Years of education: Not on  file  . Highest education level: Not on file  Occupational History  . Not on file  Tobacco Use  . Smoking status: Never Smoker  . Smokeless tobacco: Never Used  Vaping Use  . Vaping Use: Never used  Substance and Sexual Activity  . Alcohol use: No  . Drug use: No  . Sexual activity: Yes    Partners: Male    Birth control/protection: I.U.D.  Other Topics Concern  . Not on file  Social History Narrative  . Not on file   Social Determinants of Health   Financial Resource Strain:   . Difficulty of Paying Living Expenses: Not on file  Food Insecurity:   . Worried About Charity fundraiser in the Last Year: Not on file  . Ran Out of Food in the Last Year: Not on file  Transportation Needs:   . Lack of Transportation (Medical): Not on file  . Lack of Transportation (Non-Medical): Not on file  Physical Activity:   . Days of Exercise per Week: Not on file  . Minutes of Exercise per Session: Not on file  Stress:   . Feeling of Stress : Not on file  Social Connections:   . Frequency of Communication with Friends  and Family: Not on file  . Frequency of Social Gatherings with Friends and Family: Not on file  . Attends Religious Services: Not on file  . Active Member of Clubs or Organizations: Not on file  . Attends Archivist Meetings: Not on file  . Marital Status: Not on file  Intimate Partner Violence:   . Fear of Current or Ex-Partner: Not on file  . Emotionally Abused: Not on file  . Physically Abused: Not on file  . Sexually Abused: Not on file    Outpatient Medications Prior to Visit  Medication Sig Dispense Refill  . ferrous sulfate (FERROUSUL) 325 (65 FE) MG tablet Take 1 tablet (325 mg total) by mouth 2 (two) times daily with a meal. 180 tablet 1  . methocarbamol (ROBAXIN) 500 MG tablet Take 1 tablet (500 mg total) by mouth 2 (two) times daily. 20 tablet 0  . Na Sulfate-K Sulfate-Mg Sulf (SUPREP BOWEL PREP KIT) 17.5-3.13-1.6 GM/177ML SOLN Take 1 kit by  mouth as directed. For colonoscopy prep 354 mL 0  . naproxen (NAPROSYN) 500 MG tablet Take 1 tablet (500 mg total) by mouth 2 (two) times daily. 30 tablet 0  . ondansetron (ZOFRAN) 4 MG tablet Take 1 tablet (4 mg total) by mouth every 8 (eight) hours as needed for nausea or vomiting. 14 tablet 1   No facility-administered medications prior to visit.    Allergies  Allergen Reactions  . Other     ROS Review of Systems  Constitutional: Negative.   HENT: Negative.   Eyes: Negative.   Respiratory: Negative.   Cardiovascular: Negative.   Gastrointestinal: Negative.   Genitourinary: Negative.   Musculoskeletal: Negative.   Skin: Negative.   Neurological: Negative.   Psychiatric/Behavioral: Negative.       Objective:    Physical Exam Vitals and nursing note reviewed.  Constitutional:      General: She is not in acute distress.    Appearance: Normal appearance. She is normal weight. She is not ill-appearing, toxic-appearing or diaphoretic.  Cardiovascular:     Rate and Rhythm: Normal rate and regular rhythm.     Heart sounds: Normal heart sounds. No murmur heard.  No friction rub. No gallop.   Pulmonary:     Effort: Pulmonary effort is normal. No respiratory distress.     Breath sounds: Normal breath sounds. No stridor. No wheezing, rhonchi or rales.  Chest:     Chest wall: No tenderness.  Skin:    General: Skin is warm and dry.  Neurological:     General: No focal deficit present.     Mental Status: She is alert and oriented to person, place, and time. Mental status is at baseline.  Psychiatric:        Mood and Affect: Mood normal.        Behavior: Behavior normal.        Thought Content: Thought content normal.        Judgment: Judgment normal.     BP 133/82   Pulse 80   Temp 98 F (36.7 C) (Temporal)   Resp 18   Ht 5' 7" (1.702 m)   Wt 192 lb 12.8 oz (87.5 kg)   LMP 03/06/2020   SpO2 100%   BMI 30.20 kg/m  Wt Readings from Last 3 Encounters:  03/23/20  192 lb 12.8 oz (87.5 kg)  03/15/20 196 lb 8 oz (89.1 kg)  02/15/20 205 lb 0.4 oz (93 kg)     There are no preventive  care reminders to display for this patient.  There are no preventive care reminders to display for this patient.  Lab Results  Component Value Date   TSH 1.54 09/22/2019   Lab Results  Component Value Date   WBC 3.1 (L) 03/15/2020   HGB 11.3 (L) 03/15/2020   HCT 36.2 03/15/2020   MCV 75.4 (L) 03/15/2020   PLT 129 (L) 03/15/2020   Lab Results  Component Value Date   NA 142 03/15/2020   K 4.0 03/15/2020   CO2 28 03/15/2020   GLUCOSE 98 03/15/2020   BUN 6 03/15/2020   CREATININE 0.75 03/15/2020   BILITOT 0.3 03/15/2020   ALKPHOS 23 (L) 03/15/2020   AST 14 (L) 03/15/2020   ALT 13 03/15/2020   PROT 6.5 03/15/2020   ALBUMIN 3.7 03/15/2020   CALCIUM 9.0 03/15/2020   ANIONGAP 5 03/15/2020   Lab Results  Component Value Date   CHOL 151 12/28/2018   Lab Results  Component Value Date   HDL 60 12/28/2018   Lab Results  Component Value Date   LDLCALC 84 12/28/2018   Lab Results  Component Value Date   TRIG 34 12/28/2018   Lab Results  Component Value Date   CHOLHDL 2.5 12/28/2018   Lab Results  Component Value Date   HGBA1C 5.4 07/26/2019      Assessment & Plan:   Problem List Items Addressed This Visit    None    Visit Diagnoses    Current moderate episode of major depressive disorder without prior episode ([place])    -  Primary   Relevant Medications   sertraline (ZOLOFT) 25 MG tablet   sertraline (ZOLOFT) 100 MG tablet   traZODone (DESYREL) 50 MG tablet   Other Relevant Orders   Ambulatory referral to Psychology   Sleep disturbance       Relevant Medications   traZODone (DESYREL) 50 MG tablet      Meds ordered this encounter  Medications  . sertraline (ZOLOFT) 25 MG tablet    Sig: Take 1 tablet (25 mg total) by mouth daily for 7 days, THEN 2 tablets (50 mg total) daily for 7 days.    Dispense:  21 tablet    Refill:  0     Order Specific Question:   Supervising Provider    Answer:   Carlota Raspberry, JEFFREY R [[id number]]  . sertraline (ZOLOFT) 100 MG tablet    Sig: Take 1 tablet (100 mg total) by mouth daily.    Dispense:  90 tablet    Refill:  0    Order Specific Question:   Supervising Provider    Answer:   Carlota Raspberry, JEFFREY R [[id number]]  . traZODone (DESYREL) 50 MG tablet    Sig: Take 0.5-1 tablets (25-50 mg total) by mouth at bedtime as needed for sleep.    Dispense:  30 tablet    Refill:  3    Order Specific Question:   Supervising Provider    Answer:   Carlota Raspberry, JEFFREY R [[id number]]    Follow-up: Return in about 4 weeks (around 04/20/2020) for check on depression.   PLAN  Start on sertraline 75m po qd. After one week increase to 50. After two weeks increase to 100. Take in mornings with food and water. Take every day. Return for follow up in 4-6 weeks  Start trazodone 25-561mPO qhs PRN for sleep. Discussed r/se/b with patient who voices understanding.   Refer to counseling  Reviewed hematology labs with patient  Patient encouraged to  call clinic with any questions, comments, or concerns.  Maximiano Coss, NP

## 2020-03-23 NOTE — Patient Instructions (Addendum)
[name] to see you today. I am sorry you're not feeling too well - I hope we can change that.  Medications: Sertraline (Zoloft): start with 25mg  by mouth in the morning every day for one week. Then, increase to 50mg  by mouth in the morning every day for one week. Then, increase to 100mg  by mouth in the morning every day. I want to see you back in around 4-5 weeks to check on how well this medication is working. It will only work if you take it every day.   Trazodone (Desyryl): take 1/2 tablet (25mg ) by mouth about 1 hour before you plan to go to bed. Set aside 6-8 hours for sleep. If 1/2 tablet is not effective, can increase to 1 whole tablet. This is an as needed medication - only use it when you think you won't be able to sleep.  I have filled out paperwork suggesting a tentative return to work date of 05/22/19. This is a little longer than 8 weeks, I'm hoping we get you back sooner but I want to maintain flexibility if possible.   I will send a referral to a counselor who will be in touch with you in the coming 1-2 weeks.  Looking forward to seeing you soon and seeing if we can make some progress for you.  Thank you   , NP    If you have lab work done today you will be contacted with your lab results within the next 2 weeks.  If you have not heard from then please contact . The fastest way to get your results is to register for My Chart.   IF you received an x-ray today, you will receive an invoice from [facility]. Please contact [facility] at [phone] with questions or concerns regarding your invoice.   IF you received labwork today, you will receive an invoice from [facility]. Please contact [facility] at [phone] with questions or concerns regarding your invoice.   Our billing staff will not be able to assist you with questions regarding bills from these companies.  You will be contacted with the lab results as soon as they are  available. The fastest way to get your results is to activate your My Chart account. Instructions are located on the last page of this paperwork. If you have not heard from [facility] regarding the results in 2 weeks, please contact this office.

## 2020-03-28 DIAGNOSIS — D509 Iron deficiency anemia, unspecified: Secondary | ICD-10-CM | POA: Insufficient documentation

## 2020-03-28 NOTE — Progress Notes (Signed)
.   Intravenous Iron Formulation Change  Ms Vowles has insurance that requires a change in intravenous iron product from Feraheme to Venofer. Orders have been updated to reflect this change and scheduling message sent to adjust infusion appointments. Dr Leonides Schanz notified and agrees with the plan.  Allergies:  Allergies  Allergen Reactions  . Other     The plan for iron therapy is as follows: Venofer 200 mg IVPB x 5 doses.   Stephens Shire, PharmD 03/28/2020

## 2020-03-31 ENCOUNTER — Inpatient Hospital Stay: Payer: BC Managed Care – PPO

## 2020-03-31 ENCOUNTER — Other Ambulatory Visit: Payer: Self-pay

## 2020-03-31 VITALS — BP 121/61 | HR 59 | Temp 98.6°F | Resp 17

## 2020-03-31 DIAGNOSIS — D696 Thrombocytopenia, unspecified: Secondary | ICD-10-CM | POA: Diagnosis not present

## 2020-03-31 DIAGNOSIS — D509 Iron deficiency anemia, unspecified: Secondary | ICD-10-CM | POA: Diagnosis not present

## 2020-03-31 DIAGNOSIS — Z862 Personal history of diseases of the blood and blood-forming organs and certain disorders involving the immune mechanism: Secondary | ICD-10-CM

## 2020-03-31 MED ORDER — SODIUM CHLORIDE 0.9 % IV SOLN
200.0000 mg | Freq: Once | INTRAVENOUS | Status: AC
  Administered 2020-03-31: 200 mg via INTRAVENOUS
  Filled 2020-03-31: qty 200

## 2020-03-31 MED ORDER — SODIUM CHLORIDE 0.9 % IV SOLN
Freq: Once | INTRAVENOUS | Status: AC
  Filled 2020-03-31: qty 250

## 2020-03-31 NOTE — Patient Instructions (Signed)

## 2020-04-06 ENCOUNTER — Inpatient Hospital Stay: Payer: BC Managed Care – PPO | Attending: Hematology and Oncology

## 2020-04-06 ENCOUNTER — Other Ambulatory Visit: Payer: Self-pay

## 2020-04-06 VITALS — BP 105/60 | HR 57 | Temp 98.2°F | Resp 18

## 2020-04-06 DIAGNOSIS — Z862 Personal history of diseases of the blood and blood-forming organs and certain disorders involving the immune mechanism: Secondary | ICD-10-CM

## 2020-04-06 DIAGNOSIS — D509 Iron deficiency anemia, unspecified: Secondary | ICD-10-CM | POA: Insufficient documentation

## 2020-04-06 MED ORDER — SODIUM CHLORIDE 0.9 % IV SOLN
Freq: Once | INTRAVENOUS | Status: AC
  Filled 2020-04-06: qty 250

## 2020-04-06 MED ORDER — SODIUM CHLORIDE 0.9 % IV SOLN
200.0000 mg | Freq: Once | INTRAVENOUS | Status: AC
  Administered 2020-04-06: 200 mg via INTRAVENOUS
  Filled 2020-04-06: qty 200

## 2020-04-06 NOTE — Patient Instructions (Signed)

## 2020-04-11 ENCOUNTER — Inpatient Hospital Stay: Payer: BC Managed Care – PPO

## 2020-04-13 ENCOUNTER — Ambulatory Visit: Payer: BC Managed Care – PPO

## 2020-04-16 ENCOUNTER — Ambulatory Visit (INDEPENDENT_AMBULATORY_CARE_PROVIDER_SITE_OTHER): Payer: BC Managed Care – PPO | Admitting: Psychology

## 2020-04-16 DIAGNOSIS — F321 Major depressive disorder, single episode, moderate: Secondary | ICD-10-CM | POA: Diagnosis not present

## 2020-04-20 ENCOUNTER — Ambulatory Visit: Payer: BC Managed Care – PPO | Admitting: Registered Nurse

## 2020-04-20 ENCOUNTER — Inpatient Hospital Stay: Payer: BC Managed Care – PPO

## 2020-04-20 ENCOUNTER — Other Ambulatory Visit: Payer: Self-pay

## 2020-04-20 VITALS — BP 106/65 | HR 63 | Temp 98.6°F | Resp 18

## 2020-04-20 DIAGNOSIS — Z862 Personal history of diseases of the blood and blood-forming organs and certain disorders involving the immune mechanism: Secondary | ICD-10-CM

## 2020-04-20 DIAGNOSIS — D509 Iron deficiency anemia, unspecified: Secondary | ICD-10-CM | POA: Diagnosis not present

## 2020-04-20 MED ORDER — SODIUM CHLORIDE 0.9 % IV SOLN
200.0000 mg | Freq: Once | INTRAVENOUS | Status: AC
  Administered 2020-04-20: 16:00:00 200 mg via INTRAVENOUS
  Filled 2020-04-20: qty 200

## 2020-04-20 MED ORDER — SODIUM CHLORIDE 0.9 % IV SOLN
Freq: Once | INTRAVENOUS | Status: AC
  Filled 2020-04-20: qty 250

## 2020-04-20 NOTE — Progress Notes (Signed)
Patient discharged in stable condition with no complaints 

## 2020-04-20 NOTE — Patient Instructions (Signed)

## 2020-04-25 ENCOUNTER — Ambulatory Visit: Payer: BC Managed Care – PPO | Admitting: Psychology

## 2020-04-26 ENCOUNTER — Ambulatory Visit: Payer: BC Managed Care – PPO

## 2020-04-30 ENCOUNTER — Ambulatory Visit (INDEPENDENT_AMBULATORY_CARE_PROVIDER_SITE_OTHER): Payer: BC Managed Care – PPO | Admitting: Registered Nurse

## 2020-04-30 ENCOUNTER — Encounter: Payer: Self-pay | Admitting: Registered Nurse

## 2020-04-30 ENCOUNTER — Other Ambulatory Visit: Payer: Self-pay

## 2020-04-30 VITALS — BP 112/74 | HR 80 | Temp 98.0°F | Resp 18 | Ht 67.0 in | Wt 198.6 lb

## 2020-04-30 DIAGNOSIS — F321 Major depressive disorder, single episode, moderate: Secondary | ICD-10-CM | POA: Insufficient documentation

## 2020-04-30 NOTE — Patient Instructions (Signed)
° ° ° °  If you have lab work done today you will be contacted with your lab results within the next 2 weeks.  If you have not heard from us then please contact us. The fastest way to get your results is to register for My Chart. ° ° °IF you received an x-ray today, you will receive an invoice from [facility]. Please contact [facility] at [phone] with questions or concerns regarding your invoice.  ° °IF you received labwork today, you will receive an invoice from [facility]. Please contact [facility] at [phone] with questions or concerns regarding your invoice.  ° °Our billing staff will not be able to assist you with questions regarding bills from these companies. ° °You will be contacted with the lab results as soon as they are available. The fastest way to get your results is to activate your My Chart account. Instructions are located on the last page of this paperwork. If you have not heard from us regarding the results in 2 weeks, please contact this office. °  ° ° ° °

## 2020-04-30 NOTE — Progress Notes (Signed)
Established Patient Office Visit  Subjective:  Patient ID: Lauren Potter, female    DOB: [birth year]  Age: 29 y.o. MRN: [id number]  CC:  Chief Complaint  Patient presents with  . Follow-up    Patient states she is here for follow up for depression. Per patient she has been doing so much better.    HPI Lauren Potter presents for depression follow up  On sertraline 157m PO qd. Good effect. No AEs. Hopes to continue  Improved since last visit. Has been in counseling weekly. Counselor and pt agree that pt should return no sooner than Feb 7 - I agree with this plan. Requires documentation of this which I can fill out today.   Past Medical History:  Diagnosis Date  . Frequent sinus infections   . Medical history non-contributory     Past Surgical History:  Procedure Laterality Date  . APPENDECTOMY      Family History  Problem Relation Age of Onset  . Diabetes Mother   . Hypertension Father   . Diabetes Maternal Grandmother   . Diabetes Maternal Grandfather   . Hypertension Paternal Grandmother   . Colon cancer Neg Hx   . Esophageal cancer Neg Hx   . Inflammatory bowel disease Neg Hx   . Liver disease Neg Hx   . Pancreatic cancer Neg Hx   . Rectal cancer Neg Hx   . Stomach cancer Neg Hx     Social History   Socioeconomic History  . Marital status: Married    Spouse name: Not on file  . Number of children: Not on file  . Years of education: Not on file  . Highest education level: Not on file  Occupational History  . Not on file  Tobacco Use  . Smoking status: Never Smoker  . Smokeless tobacco: Never Used  Vaping Use  . Vaping Use: Never used  Substance and Sexual Activity  . Alcohol use: No  . Drug use: No  . Sexual activity: Yes    Partners: Male    Birth control/protection: I.U.D.  Other Topics Concern  . Not on file  Social History Narrative  . Not on file   Social Determinants of Health   Financial Resource Strain: Not on file  Food  Insecurity: Not on file  Transportation Needs: Not on file  Physical Activity: Not on file  Stress: Not on file  Social Connections: Not on file  Intimate Partner Violence: Not on file    Outpatient Medications Prior to Visit  Medication Sig Dispense Refill  . ferrous sulfate (FERROUSUL) 325 (65 FE) MG tablet Take 1 tablet (325 mg total) by mouth 2 (two) times daily with a meal. 180 tablet 1  . methocarbamol (ROBAXIN) 500 MG tablet Take 1 tablet (500 mg total) by mouth 2 (two) times daily. 20 tablet 0  . naproxen (NAPROSYN) 500 MG tablet Take 1 tablet (500 mg total) by mouth 2 (two) times daily. 30 tablet 0  . sertraline (ZOLOFT) 100 MG tablet Take 1 tablet (100 mg total) by mouth daily. 90 tablet 0  . traZODone (DESYREL) 50 MG tablet Take 0.5-1 tablets (25-50 mg total) by mouth at bedtime as needed for sleep. 30 tablet 3  . Na Sulfate-K Sulfate-Mg Sulf (SUPREP BOWEL PREP KIT) 17.5-3.13-1.6 GM/177ML SOLN Take 1 kit by mouth as directed. For colonoscopy prep 354 mL 0  . ondansetron (ZOFRAN) 4 MG tablet Take 1 tablet (4 mg total) by mouth every 8 (eight) hours as needed  for nausea or vomiting. 14 tablet 1  . sertraline (ZOLOFT) 25 MG tablet Take 1 tablet (25 mg total) by mouth daily for 7 days, THEN 2 tablets (50 mg total) daily for 7 days. 21 tablet 0   No facility-administered medications prior to visit.    Allergies  Allergen Reactions  . Dust Mite Extract   . Other   . Shellfish Allergy     ROS Review of Systems  Constitutional: Negative.   HENT: Negative.   Eyes: Negative.   Respiratory: Negative.   Cardiovascular: Negative.   Gastrointestinal: Negative.   Genitourinary: Negative.   Musculoskeletal: Negative.   Skin: Negative.   Neurological: Negative.   Psychiatric/Behavioral: Negative.   All other systems reviewed and are negative.     Objective:    Physical Exam Vitals and nursing note reviewed.  Constitutional:      General: She is not in acute distress.     Appearance: Normal appearance. She is normal weight. She is not ill-appearing, toxic-appearing or diaphoretic.  Cardiovascular:     Rate and Rhythm: Normal rate and regular rhythm.     Heart sounds: Normal heart sounds. No murmur heard. No friction rub. No gallop.   Pulmonary:     Effort: Pulmonary effort is normal. No respiratory distress.     Breath sounds: Normal breath sounds. No stridor. No wheezing, rhonchi or rales.  Chest:     Chest wall: No tenderness.  Skin:    General: Skin is warm and dry.  Neurological:     General: No focal deficit present.     Mental Status: She is alert and oriented to person, place, and time. Mental status is at baseline.  Psychiatric:        Mood and Affect: Mood normal.        Behavior: Behavior normal.        Thought Content: Thought content normal.        Judgment: Judgment normal.     BP 112/74   Pulse 80   Temp 98 F (36.7 C) (Temporal)   Resp 18   Ht '5\' 7"'  (1.702 m)   Wt 198 lb 9.6 oz (90.1 kg)   SpO2 95%   BMI 31.11 kg/m  Wt Readings from Last 3 Encounters:  04/30/20 198 lb 9.6 oz (90.1 kg)  03/23/20 192 lb 12.8 oz (87.5 kg)  03/15/20 196 lb 8 oz (89.1 kg)     There are no preventive care reminders to display for this patient.  There are no preventive care reminders to display for this patient.  Lab Results  Component Value Date   TSH 1.54 09/22/2019   Lab Results  Component Value Date   WBC 3.1 (L) 03/15/2020   HGB 11.3 (L) 03/15/2020   HCT 36.2 03/15/2020   MCV 75.4 (L) 03/15/2020   PLT 129 (L) 03/15/2020   Lab Results  Component Value Date   NA 142 03/15/2020   K 4.0 03/15/2020   CO2 28 03/15/2020   GLUCOSE 98 03/15/2020   BUN 6 03/15/2020   CREATININE 0.75 03/15/2020   BILITOT 0.3 03/15/2020   ALKPHOS 23 (L) 03/15/2020   AST 14 (L) 03/15/2020   ALT 13 03/15/2020   PROT 6.5 03/15/2020   ALBUMIN 3.7 03/15/2020   CALCIUM 9.0 03/15/2020   ANIONGAP 5 03/15/2020   Lab Results  Component Value Date    CHOL 151 12/28/2018   Lab Results  Component Value Date   HDL 60 12/28/2018   Lab  Results  Component Value Date   LDLCALC 84 12/28/2018   Lab Results  Component Value Date   TRIG 34 12/28/2018   Lab Results  Component Value Date   CHOLHDL 2.5 12/28/2018   Lab Results  Component Value Date   HGBA1C 5.4 07/26/2019      Assessment & Plan:   Problem List Items Addressed This Visit      Other   Current moderate episode of major depressive disorder without prior episode ([place]) - Primary      No orders of the defined types were placed in this encounter.   Follow-up: No follow-ups on file.   PLAN  Continue on current meds  Return prn  Form filled out, letter written  Patient encouraged to call clinic with any questions, comments, or concerns.  Maximiano Coss, NP

## 2020-05-03 ENCOUNTER — Ambulatory Visit: Payer: BC Managed Care – PPO

## 2020-05-09 ENCOUNTER — Telehealth: Payer: Self-pay | Admitting: Registered Nurse

## 2020-05-09 NOTE — Telephone Encounter (Signed)
I have given Lauren Potter paperwork from [employer] concerning pt. This paperwork needs to be filled out by [employer]

## 2020-05-10 NOTE — Telephone Encounter (Signed)
Paperwork received. Digital copy can be found in [facility]. Hardcopy is in mailbox on second floor

## 2020-05-11 ENCOUNTER — Inpatient Hospital Stay: Payer: No Typology Code available for payment source | Attending: Hematology and Oncology

## 2020-05-11 ENCOUNTER — Other Ambulatory Visit: Payer: Self-pay

## 2020-05-11 VITALS — BP 106/57 | HR 62 | Temp 99.1°F | Resp 18

## 2020-05-11 DIAGNOSIS — Z862 Personal history of diseases of the blood and blood-forming organs and certain disorders involving the immune mechanism: Secondary | ICD-10-CM

## 2020-05-11 DIAGNOSIS — D509 Iron deficiency anemia, unspecified: Secondary | ICD-10-CM | POA: Diagnosis present

## 2020-05-11 MED ORDER — SODIUM CHLORIDE 0.9 % IV SOLN
200.0000 mg | Freq: Once | INTRAVENOUS | Status: AC
  Administered 2020-05-11: 200 mg via INTRAVENOUS
  Filled 2020-05-11: qty 200

## 2020-05-11 MED ORDER — SODIUM CHLORIDE 0.9 % IV SOLN
Freq: Once | INTRAVENOUS | Status: AC
  Filled 2020-05-11: qty 250

## 2020-05-11 NOTE — Patient Instructions (Signed)

## 2020-05-11 NOTE — Telephone Encounter (Signed)
Printed and filled in some portions placed in providers box to be done.

## 2020-05-11 NOTE — Progress Notes (Signed)
Patient chose to stay 15 minutes post observation

## 2020-05-18 ENCOUNTER — Ambulatory Visit (INDEPENDENT_AMBULATORY_CARE_PROVIDER_SITE_OTHER): Payer: No Typology Code available for payment source | Admitting: Psychology

## 2020-05-18 DIAGNOSIS — F321 Major depressive disorder, single episode, moderate: Secondary | ICD-10-CM

## 2020-05-28 ENCOUNTER — Telehealth: Payer: Self-pay | Admitting: Hematology and Oncology

## 2020-05-28 NOTE — Telephone Encounter (Signed)
Called to inform patient of her rescheduled appointment. Patient is aware of changes.

## 2020-06-22 ENCOUNTER — Ambulatory Visit: Payer: BC Managed Care – PPO | Admitting: Hematology and Oncology

## 2020-06-22 ENCOUNTER — Other Ambulatory Visit: Payer: BC Managed Care – PPO

## 2020-06-29 ENCOUNTER — Other Ambulatory Visit: Payer: No Typology Code available for payment source

## 2020-06-29 ENCOUNTER — Ambulatory Visit: Payer: Self-pay | Admitting: Hematology and Oncology

## 2020-07-01 ENCOUNTER — Other Ambulatory Visit: Payer: Self-pay | Admitting: Hematology and Oncology

## 2020-07-01 DIAGNOSIS — D509 Iron deficiency anemia, unspecified: Secondary | ICD-10-CM

## 2020-07-01 NOTE — Progress Notes (Signed)
No show

## 2020-07-02 ENCOUNTER — Inpatient Hospital Stay: Payer: No Typology Code available for payment source | Attending: Hematology and Oncology

## 2020-07-02 ENCOUNTER — Inpatient Hospital Stay: Payer: No Typology Code available for payment source | Admitting: Hematology and Oncology

## 2020-07-02 DIAGNOSIS — D509 Iron deficiency anemia, unspecified: Secondary | ICD-10-CM

## 2020-07-02 DIAGNOSIS — R718 Other abnormality of red blood cells: Secondary | ICD-10-CM

## 2020-07-09 ENCOUNTER — Telehealth: Payer: Self-pay | Admitting: Hematology and Oncology

## 2020-07-09 NOTE — Telephone Encounter (Signed)
Scheduled appointments per 03/06 schedule message. Contacted patient, patient is aware. 

## 2020-08-20 ENCOUNTER — Other Ambulatory Visit: Payer: No Typology Code available for payment source

## 2020-08-20 ENCOUNTER — Inpatient Hospital Stay
Payer: No Typology Code available for payment source | Attending: Hematology and Oncology | Admitting: Hematology and Oncology

## 2021-04-18 ENCOUNTER — Encounter: Payer: Self-pay | Admitting: Family Medicine

## 2021-04-18 ENCOUNTER — Ambulatory Visit: Payer: No Typology Code available for payment source | Admitting: Registered Nurse

## 2021-04-18 ENCOUNTER — Other Ambulatory Visit: Payer: Self-pay

## 2021-04-18 ENCOUNTER — Ambulatory Visit (INDEPENDENT_AMBULATORY_CARE_PROVIDER_SITE_OTHER): Payer: No Typology Code available for payment source | Admitting: Family Medicine

## 2021-04-18 VITALS — BP 111/61 | HR 79 | Temp 98.3°F | Resp 17 | Ht 67.0 in | Wt 198.6 lb

## 2021-04-18 DIAGNOSIS — R109 Unspecified abdominal pain: Secondary | ICD-10-CM

## 2021-04-18 DIAGNOSIS — R195 Other fecal abnormalities: Secondary | ICD-10-CM

## 2021-04-18 DIAGNOSIS — R110 Nausea: Secondary | ICD-10-CM

## 2021-04-18 DIAGNOSIS — R197 Diarrhea, unspecified: Secondary | ICD-10-CM

## 2021-04-18 DIAGNOSIS — K529 Noninfective gastroenteritis and colitis, unspecified: Secondary | ICD-10-CM | POA: Diagnosis not present

## 2021-04-18 LAB — IFOBT (OCCULT BLOOD): IFOBT: NEGATIVE

## 2021-04-18 MED ORDER — ONDANSETRON 4 MG PO TBDP: 4.0000 mg | ORAL_TABLET | Freq: Three times a day (TID) | ORAL | 0 refills | Status: DC | PRN

## 2021-04-18 NOTE — Patient Instructions (Addendum)
Glad to hear the diarrhea has improved.  I suspect you had a viral gastroenteritis or stomach bug.  That should continue to improve.  I do recommend bland diet for now such as crackers, soup, then over the next few days slowly increase your diet as you are symptoms improved.  If there are concerns on your blood count I will let you know.  If you notice continued dark stools or blood in the stools to be seen.  If symptoms are worsening, be seen.  Zofran if needed for nausea. That can cause constipation.   I do recommend calling Dr. Elesa Hacker office for follow-up of the nausea and cramping does not appear they have seen you in some time.  Please let us know if there are questions.  Return to the clinic or go to the nearest emergency room if any of your symptoms worsen or new symptoms occur.  Gastroenteritis:  Diarrhea Infections caused by germs (bacterial) or a virus commonly cause diarrhea. Your caregiver has determined that with time, rest and fluids, the diarrhea should improve. In general, eat normally while drinking more water than usual. Although water may prevent dehydration, it does not contain salt and minerals (electrolytes). Broths, weak tea without caffeine and oral rehydration solutions (ORS) replace fluids and electrolytes. Small amounts of fluids should be taken frequently. Large amounts at one time may not be tolerated. Plain water may be harmful in infants and the elderly. Oral rehydrating solutions (ORS) are available at pharmacies and grocery stores. ORS replace water and important electrolytes in proper proportions. Sports drinks are not as effective as ORS and may be harmful due to sugars worsening diarrhea. ORS is especially recommended for use in children with diarrhea. As a general guideline for children, replace any new fluid losses from diarrhea and/or vomiting with ORS as follows:  If your child weighs 22 pounds or under (10 kg or less), give 60-120 mL ( -  cup or 2 - 4  ounces) of ORS for each episode of diarrheal stool or vomiting episode.  If your child weighs more than 22 pounds (more than 10 kgs), give 120-240 mL ( - 1 cup or 4 - 8 ounces) of ORS for each diarrheal stool or episode of vomiting.  While correcting for dehydration, children should eat normally. However, foods high in sugar should be avoided because this may worsen diarrhea. Large amounts of carbonated soft drinks, juice, gelatin desserts and other highly sugared drinks should be avoided.  After correction of dehydration, other liquids that are appealing to the child may be added. Children should drink small amounts of fluids frequently and fluids should be increased as tolerated. Children should drink enough fluids to keep urine clear or pale yellow.  Adults should eat normally while drinking more fluids than usual. Drink small amounts of fluids frequently and increase as tolerated. Drink enough fluids to keep urine clear or pale yellow. Broths, weak decaffeinated tea, lemon lime soft drinks (allowed to go flat) and ORS replace fluids and electrolytes.  Avoid:  Carbonated drinks.  Juice.  Extremely hot or cold fluids.  Caffeine drinks.  Fatty, greasy foods.  Alcohol.  Tobacco.  Too much intake of anything at one time.  Gelatin desserts.  Probiotics are active cultures of beneficial bacteria. They may lessen the amount and number of diarrheal stools in adults. Probiotics can be found in yogurt with active cultures and in supplements.  Wash hands well to avoid spreading bacteria and virus.  Anti-diarrheal medications are not recommended  for infants and children.  Only take over-the-counter or prescription medicines for pain, discomfort or fever as directed by your caregiver. Do not give aspirin to children because it may cause Reye's Syndrome.  For adults, ask your caregiver if you should continue all prescribed and over-the-counter medicines.  If your caregiver has given you a follow-up  appointment, it is very important to keep that appointment. Not keeping the appointment could result in a chronic or permanent injury, and disability. If there is any problem keeping the appointment, you must call back to this facility for assistance.  SEEK IMMEDIATE MEDICAL CARE IF:  You or your child is unable to keep fluids down or other symptoms or problems become worse in spite of treatment.  Vomiting or diarrhea develops and becomes persistent.  There is vomiting of blood or bile (green material).  There is blood in the stool or the stools are black and tarry.  There is no urine output in 6-8 hours or there is only a small amount of very dark urine.  Abdominal pain develops, increases or localizes.  You have a fever.  Your baby is older than 3 months with a rectal temperature of 102 F (38.9 C) or higher.  Your baby is 7 months old or younger with a rectal temperature of 100.4 F (38 C) or higher.  You or your child develops excessive weakness, dizziness, fainting or extreme thirst.  You or your child develops a rash, stiff neck, severe headache or become irritable or sleepy and difficult to awaken.  MAKE SURE YOU:  Understand these instructions.  Will watch your condition.  Will get help right away if you are not doing well or get worse.  Document Released: 04/11/2002 Document Revised: 04/10/2011 Document Reviewed: 02/26/2009 [facility] Patient Information 2012 [place], [place].  Nausea and Vomiting Nausea is a sick feeling that often comes before throwing up (vomiting). Vomiting is a reflex where stomach contents come out of your mouth. Vomiting can cause severe loss of body fluids (dehydration). Children and elderly adults can become dehydrated quickly, especially if they also have diarrhea. Nausea and vomiting are symptoms of a condition or disease. It is important to find the cause of your symptoms. CAUSES  Direct irritation of the stomach lining. This irritation can result from  increased acid production (gastroesophageal reflux disease), infection, food poisoning, taking certain medicines (such as nonsteroidal anti-inflammatory drugs), alcohol use, or tobacco use.  Signals from the brain. These signals could be caused by a headache, heat exposure, an inner ear disturbance, increased pressure in the brain from injury, infection, a tumor, or a concussion, pain, emotional stimulus, or metabolic problems.  An obstruction in the gastrointestinal tract (bowel obstruction).  Illnesses such as diabetes, hepatitis, gallbladder problems, appendicitis, kidney problems, cancer, sepsis, atypical symptoms of a heart attack, or eating disorders.  Medical treatments such as chemotherapy and radiation.  Receiving medicine that makes you sleep (general anesthetic) during surgery.  DIAGNOSIS Your caregiver may ask for tests to be done if the problems do not improve after a few days. Tests may also be done if symptoms are severe or if the reason for the nausea and vomiting is not clear. Tests may include: Urine tests.  Blood tests.  Stool tests.  Cultures (to look for evidence of infection).  X-rays or other imaging studies.  Test results can help your caregiver make decisions about treatment or the need for additional tests. TREATMENT You need to stay well hydrated. Drink frequently but in small amounts. You  may wish to drink water, sports drinks, clear broth, or eat frozen ice pops or gelatin dessert to help stay hydrated. When you eat, eating slowly may help prevent nausea. There are also some antinausea medicines that may help prevent nausea. HOME CARE INSTRUCTIONS  Take all medicine as directed by your caregiver.  If you do not have an appetite, do not force yourself to eat. However, you must continue to drink fluids.  If you have an appetite, eat a normal diet unless your caregiver tells you differently.  Eat a variety of complex carbohydrates (rice, wheat, potatoes, bread), lean  meats, yogurt, fruits, and vegetables.  Avoid high-fat foods because they are more difficult to digest.  Drink enough water and fluids to keep your urine clear or pale yellow.  If you are dehydrated, ask your caregiver for specific rehydration instructions. Signs of dehydration may include:  Severe thirst.  Dry lips and mouth.  Dizziness.  Dark urine.  Decreasing urine frequency and amount.  Confusion.  Rapid breathing or pulse.  SEEK IMMEDIATE MEDICAL CARE IF:  You have blood or brown flecks (like coffee grounds) in your vomit.  You have black or bloody stools.  You have a severe headache or stiff neck.  You are confused.  You have severe abdominal pain.  You have chest pain or trouble breathing.  You do not urinate at least once every 8 hours.  You develop cold or clammy skin.  You continue to vomit for longer than 24 to 48 hours.  You have a fever.  MAKE SURE YOU:  Understand these instructions.  Will watch your condition.  Will get help right away if you are not doing well or get worse.  Document Released: 04/21/2005 Document Revised: 04/10/2011 Document Reviewed: 09/18/2010 [facility] Patient Information 2012 [place], [place].  Return to the clinic or go to the nearest emergency room if any of your symptoms worsen or new symptoms occur.    If you have lab work done today you will be contacted with your lab results within the next 2 weeks.  If you have not heard from [place] then please contact us. The fastest way to get your results is to register for My Chart.   IF you received an x-ray today, you will receive an invoice from [facility]. Please contact [facility] at [phone] with questions or concerns regarding your invoice.   IF you received labwork today, you will receive an invoice from [place]. Please contact [facility] at [phone] with questions or concerns regarding your invoice.   Our billing staff will not be able to assist you with questions  regarding bills from these companies.  You will be contacted with the lab results as soon as they are available. The fastest way to get your results is to activate your My Chart account. Instructions are located on the last page of this paperwork. If you have not heard from [place] regarding the results in 2 weeks, please contact this office.

## 2021-04-18 NOTE — Progress Notes (Signed)
Subjective:  Patient ID: Lauren Potter, female    DOB: 31-Jul-1990  Age: 30 y.o. MRN: [id number]  CC:  Chief Complaint  Patient presents with   Nausea    Patient states she has been feeling sick since last week she has been experiencing some nausea and diarrhea and making atleast 5 bowel movements a day or more and its unlike her. Pt states starting this Monday nausea got worse and stool has became black. Her son is having the same symptoms.She has been taking Pepto and has not felt any better.    HPI Lauren Potter presents   Nausea/diarrhea: Started approx 1 week ago.  Initially nausea stomachache. No vomiting. Soft stools last weekend, then diarrhea starting 2 days ago. Worse 1st 2 days, up to 20 episodes, but slowed down past 2 days. Last diarrhea episode yesterday morning. Still some nausea, stomach still feels upset.  Black stool Tuesday and yesterday morning. No BRBPR. Took pepto bismol 4 times on Monday (3 d ago) none since.  Sick contact - sons with similar symptoms past few days.  No fever.  Has been drinking fluids, nl UOP.  Eating dry foods - chicken wings, fries this morning.   Evaluated by gastroenterology in May 2021, Dr. Meridee Score.  Postprandial abdominal pain, nausea, few episodes of vomiting previously.  Chronic constipation.  Treated with daily Nexium, Zofran if needed.  Referred to hematology for iron deficiency anemia.  H. pylori antigen detected.  Treated with Pylera.  PPI daily for 1 month, off for 2 weeks planned with repeat H. pylori testing.  Possible EGD, colonoscopy also planned. In the recent GI eval.  Has been under care of Dr. Leonides Schanz for microcytosis, iron deficiency.  Last visit November 11. Next Venofer infusion December 17.  Last given December 3. Occasional persistent nausea, stomach cramping prior to current illness.    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 Authorizing Provider  ferrous sulfate (FERROUSUL) 325 (65 FE) MG tablet Take 1 tablet (325 mg total) by mouth 2 (two) times daily with a meal. 12/29/18   Janeece Agee, NP  methocarbamol (ROBAXIN) 500 MG tablet Take 1 tablet (500 mg total) by mouth 2 (two) times daily. 02/15/20   Khatri, Hina, PA-C  naproxen (NAPROSYN) 500 MG tablet Take 1 tablet (500 mg total) by mouth 2 (two) times daily. 02/15/20   Khatri, Hina, PA-C  sertraline (ZOLOFT) 100 MG tablet Take 1 tablet (100 mg total) by mouth daily. 03/23/20   Janeece Agee, NP  traZODone (  DESYREL) 50 MG tablet Take 0.5-1 tablets (25-50 mg total) by mouth at bedtime as needed for sleep. 03/23/20   Janeece Agee, NP   Social History   Socioeconomic History   Marital status: Married    Spouse name: Not on file   Number of children: Not on file   Years of education: Not on file   Highest education level: Not on file  Occupational History   Not on file  Tobacco Use   Smoking status: Never   Smokeless tobacco: Never  Vaping Use   Vaping Use: Never used  Substance and Sexual  Activity   Alcohol use: No   Drug use: No   Sexual activity: Yes    Partners: Male    Birth control/protection: I.U.D.  Other Topics Concern   Not on file  Social History Narrative   Not on file   Social Determinants of Health   Financial Resource Strain: Not on file  Food Insecurity: Not on file  Transportation Needs: Not on file  Physical Activity: Not on file  Stress: Not on file  Social Connections: Not on file  Intimate Partner Violence: Not on file    Review of Systems Per HPI.   Objective:   Vitals:   04/18/21 1316  BP: 111/61  Pulse: 79  Resp: 17  Temp: 98.3 F (36.8 C)  TempSrc: Temporal  SpO2: 100%  Weight: 198 lb 9.6 oz (90.1 kg)  Height: 5\' 7"  (1.702 m)     Physical Exam Vitals reviewed. Exam conducted with a chaperone present.  Constitutional:      Appearance: Normal appearance. She is well-developed.  HENT:     Head: Normocephalic and atraumatic.  Eyes:     Conjunctiva/sclera: Conjunctivae normal.     Pupils: Pupils are equal, round, and reactive to light.  Neck:     Vascular: No carotid bruit.  Cardiovascular:     Rate and Rhythm: Normal rate and regular rhythm.     Heart sounds: Normal heart sounds.  Pulmonary:     Effort: Pulmonary effort is normal.     Breath sounds: Normal breath sounds.  Abdominal:     General: Abdomen is flat.     Palpations: Abdomen is soft. There is no pulsatile mass.     Tenderness: There is abdominal tenderness (min suprapubic, no rebound/guarding. hyperactive bs.).  Genitourinary:    Rectum: Guaiac result negative. No tenderness or external hemorrhoid. Normal anal tone.     Comments: No appreciable stool on DRE. Assistant Brooke present.  Musculoskeletal:     Right lower leg: No edema.     Left lower leg: No edema.  Skin:    General: Skin is warm and dry.  Neurological:     Mental Status: She is alert and oriented to person, place, and time.  Psychiatric:        Mood and Affect: Mood normal.         Behavior: Behavior normal.   38 minutes spent during visit, including chart review, counseling and assimilation of information, exam, discussion of plan, and chart completion.   Assessment & Plan:  Lauren Potter is a 30 y.o. female . Nausea  Diarrhea, unspecified type  Gastroenteritis  Dark stools  Abdominal cramping  Suspected viral gastroenteritis with nausea, diarrhea which has improved.  Afebrile.  Appears well-hydrated, tolerating p.o. fluids.  Did eat regular meal earlier, has kept down so far without recurrence of diarrhea.  Overall reassuring exam.  - Dark stools likely due to  Pepto-Bismol.  Heme-negative testing in office, check CBC.  -Zofran if needed for nausea, small frequent fluids, and slowly advance diet, start with bland foods.  -Recommended follow-up with gastroenterology given intermittent nausea, cramping prior to recent illness, and it appears possible endoscopy/colonoscopy had been discussed last year.  -RTC/ER precautions. No orders of the defined types were placed in this encounter.  Patient Instructions   Glad to hear the diarrhea has improved.  I suspect you had a viral gastroenteritis or stomach bug.  That should continue to improve.  I do recommend bland diet for now such as crackers, soup, then over the next few days slowly increase your diet as you are symptoms improved.  If there are concerns on your blood count I will let you know.  If you notice continued dark stools or blood in the stools to be seen.  If symptoms are worsening, be seen.  I do recommend calling Dr. Elesa Hacker office for follow-up of the nausea and cramping does not appear they have seen you in some time.  Please let us know if there are questions.  Return to the clinic or go to the nearest emergency room if any of your symptoms worsen or new symptoms occur.  Gastroenteritis:  Diarrhea Infections caused by germs (bacterial) or a virus commonly cause diarrhea. Your caregiver has  determined that with time, rest and fluids, the diarrhea should improve. In general, eat normally while drinking more water than usual. Although water may prevent dehydration, it does not contain salt and minerals (electrolytes). Broths, weak tea without caffeine and oral rehydration solutions (ORS) replace fluids and electrolytes. Small amounts of fluids should be taken frequently. Large amounts at one time may not be tolerated. Plain water may be harmful in infants and the elderly. Oral rehydrating solutions (ORS) are available at pharmacies and grocery stores. ORS replace water and important electrolytes in proper proportions. Sports drinks are not as effective as ORS and may be harmful due to sugars worsening diarrhea. ORS is especially recommended for use in children with diarrhea. As a general guideline for children, replace any new fluid losses from diarrhea and/or vomiting with ORS as follows:  If your child weighs 22 pounds or under (10 kg or less), give 60-120 mL ( -  cup or 2 - 4 ounces) of ORS for each episode of diarrheal stool or vomiting episode.  If your child weighs more than 22 pounds (more than 10 kgs), give 120-240 mL ( - 1 cup or 4 - 8 ounces) of ORS for each diarrheal stool or episode of vomiting.  While correcting for dehydration, children should eat normally. However, foods high in sugar should be avoided because this may worsen diarrhea. Large amounts of carbonated soft drinks, juice, gelatin desserts and other highly sugared drinks should be avoided.  After correction of dehydration, other liquids that are appealing to the child may be added. Children should drink small amounts of fluids frequently and fluids should be increased as tolerated. Children should drink enough fluids to keep urine clear or pale yellow.  Adults should eat normally while drinking more fluids than usual. Drink small amounts of fluids frequently and increase as tolerated. Drink enough fluids to keep urine  clear or pale yellow. Broths, weak decaffeinated tea, lemon lime soft drinks (allowed to go flat) and ORS replace fluids and electrolytes.  Avoid:  Carbonated drinks.  Juice.  Extremely hot or cold fluids.  Caffeine drinks.  Fatty, greasy foods.  Alcohol.  Tobacco.  Too much  intake of anything at one time.  Gelatin desserts.  Probiotics are active cultures of beneficial bacteria. They may lessen the amount and number of diarrheal stools in adults. Probiotics can be found in yogurt with active cultures and in supplements.  Wash hands well to avoid spreading bacteria and virus.  Anti-diarrheal medications are not recommended for infants and children.  Only take over-the-counter or prescription medicines for pain, discomfort or fever as directed by your caregiver. Do not give aspirin to children because it may cause Reye's Syndrome.  For adults, ask your caregiver if you should continue all prescribed and over-the-counter medicines.  If your caregiver has given you a follow-up appointment, it is very important to keep that appointment. Not keeping the appointment could result in a chronic or permanent injury, and disability. If there is any problem keeping the appointment, you must call back to this facility for assistance.  SEEK IMMEDIATE MEDICAL CARE IF:  You or your child is unable to keep fluids down or other symptoms or problems become worse in spite of treatment.  Vomiting or diarrhea develops and becomes persistent.  There is vomiting of blood or bile (green material).  There is blood in the stool or the stools are black and tarry.  There is no urine output in 6-8 hours or there is only a small amount of very dark urine.  Abdominal pain develops, increases or localizes.  You have a fever.  Your baby is older than 3 months with a rectal temperature of 102 F (38.9 C) or higher.  Your baby is 69 months old or younger with a rectal temperature of 100.4 F (38 C) or higher.  You or your  child develops excessive weakness, dizziness, fainting or extreme thirst.  You or your child develops a rash, stiff neck, severe headache or become irritable or sleepy and difficult to awaken.  MAKE SURE YOU:  Understand these instructions.  Will watch your condition.  Will get help right away if you are not doing well or get worse.  Document Released: 04/11/2002 Document Revised: 04/10/2011 Document Reviewed: 02/26/2009 [facility] Patient Information 2012 [place], [place].  Nausea and Vomiting Nausea is a sick feeling that often comes before throwing up (vomiting). Vomiting is a reflex where stomach contents come out of your mouth. Vomiting can cause severe loss of body fluids (dehydration). Children and elderly adults can become dehydrated quickly, especially if they also have diarrhea. Nausea and vomiting are symptoms of a condition or disease. It is important to find the cause of your symptoms. CAUSES  Direct irritation of the stomach lining. This irritation can result from increased acid production (gastroesophageal reflux disease), infection, food poisoning, taking certain medicines (such as nonsteroidal anti-inflammatory drugs), alcohol use, or tobacco use.  Signals from the brain. These signals could be caused by a headache, heat exposure, an inner ear disturbance, increased pressure in the brain from injury, infection, a tumor, or a concussion, pain, emotional stimulus, or metabolic problems.  An obstruction in the gastrointestinal tract (bowel obstruction).  Illnesses such as diabetes, hepatitis, gallbladder problems, appendicitis, kidney problems, cancer, sepsis, atypical symptoms of a heart attack, or eating disorders.  Medical treatments such as chemotherapy and radiation.  Receiving medicine that makes you sleep (general anesthetic) during surgery.  DIAGNOSIS Your caregiver may ask for tests to be done if the problems do not improve after a few days. Tests may also be done if symptoms  are severe or if the reason for the nausea and vomiting is not  clear. Tests may include: Urine tests.  Blood tests.  Stool tests.  Cultures (to look for evidence of infection).  X-rays or other imaging studies.  Test results can help your caregiver make decisions about treatment or the need for additional tests. TREATMENT You need to stay well hydrated. Drink frequently but in small amounts. You may wish to drink water, sports drinks, clear broth, or eat frozen ice pops or gelatin dessert to help stay hydrated. When you eat, eating slowly may help prevent nausea. There are also some antinausea medicines that may help prevent nausea. HOME CARE INSTRUCTIONS  Take all medicine as directed by your caregiver.  If you do not have an appetite, do not force yourself to eat. However, you must continue to drink fluids.  If you have an appetite, eat a normal diet unless your caregiver tells you differently.  Eat a variety of complex carbohydrates (rice, wheat, potatoes, bread), lean meats, yogurt, fruits, and vegetables.  Avoid high-fat foods because they are more difficult to digest.  Drink enough water and fluids to keep your urine clear or pale yellow.  If you are dehydrated, ask your caregiver for specific rehydration instructions. Signs of dehydration may include:  Severe thirst.  Dry lips and mouth.  Dizziness.  Dark urine.  Decreasing urine frequency and amount.  Confusion.  Rapid breathing or pulse.  SEEK IMMEDIATE MEDICAL CARE IF:  You have blood or brown flecks (like coffee grounds) in your vomit.  You have black or bloody stools.  You have a severe headache or stiff neck.  You are confused.  You have severe abdominal pain.  You have chest pain or trouble breathing.  You do not urinate at least once every 8 hours.  You develop cold or clammy skin.  You continue to vomit for longer than 24 to 48 hours.  You have a fever.  MAKE SURE YOU:  Understand these instructions.  Will watch  your condition.  Will get help right away if you are not doing well or get worse.  Document Released: 04/21/2005 Document Revised: 04/10/2011 Document Reviewed: 09/18/2010 [facility] Patient Information 2012 [place], [place].  Return to the clinic or go to the nearest emergency room if any of your symptoms worsen or new symptoms occur.    If you have lab work done today you will be contacted with your lab results within the next 2 weeks.  If you have not heard from [place] then please contact us. The fastest way to get your results is to register for My Chart.   IF you received an x-ray today, you will receive an invoice from [facility]. Please contact [facility] at [phone] with questions or concerns regarding your invoice.   IF you received labwork today, you will receive an invoice from [facility]. Please contact [facility] at [phone] with questions or concerns regarding your invoice.   Our billing staff will not be able to assist you with questions regarding bills from these companies.  You will be contacted with the lab results as soon as they are available. The fastest way to get your results is to activate your My Chart account. Instructions are located on the last page of this paperwork. If you have not heard from [place] regarding the results in 2 weeks, please contact this office.       Signed,   Meredith Staggers, MD [facility], [facility] 04/18/21 1:36 PM

## 2021-04-19 ENCOUNTER — Encounter: Payer: Self-pay | Admitting: Family Medicine

## 2021-04-19 ENCOUNTER — Other Ambulatory Visit (INDEPENDENT_AMBULATORY_CARE_PROVIDER_SITE_OTHER): Payer: No Typology Code available for payment source

## 2021-04-19 DIAGNOSIS — K529 Noninfective gastroenteritis and colitis, unspecified: Secondary | ICD-10-CM

## 2021-04-19 DIAGNOSIS — R195 Other fecal abnormalities: Secondary | ICD-10-CM | POA: Diagnosis not present

## 2021-04-19 LAB — CBC
HCT: 36.8 % (ref 36.0–46.0)
Hemoglobin: 11.7 g/dL — ABNORMAL LOW (ref 12.0–15.0)
MCHC: 31.7 g/dL (ref 30.0–36.0)
MCV: 72.3 fl — ABNORMAL LOW (ref 78.0–100.0)
Platelets: 161 10*3/uL (ref 150.0–400.0)
RBC: 5.09 Mil/uL (ref 3.87–5.11)
RDW: 15.4 % (ref 11.5–15.5)
WBC: 3.5 10*3/uL — ABNORMAL LOW (ref 4.0–10.5)

## 2021-05-31 ENCOUNTER — Encounter: Payer: Self-pay | Admitting: Hematology and Oncology

## 2021-06-07 ENCOUNTER — Other Ambulatory Visit: Payer: 59

## 2021-06-07 ENCOUNTER — Other Ambulatory Visit (INDEPENDENT_AMBULATORY_CARE_PROVIDER_SITE_OTHER): Payer: 59

## 2021-06-07 ENCOUNTER — Inpatient Hospital Stay (HOSPITAL_BASED_OUTPATIENT_CLINIC_OR_DEPARTMENT_OTHER): Payer: 59 | Admitting: Hematology and Oncology

## 2021-06-07 ENCOUNTER — Other Ambulatory Visit: Payer: Self-pay

## 2021-06-07 ENCOUNTER — Encounter: Payer: Self-pay | Admitting: Gastroenterology

## 2021-06-07 ENCOUNTER — Inpatient Hospital Stay: Payer: 59 | Attending: Hematology and Oncology

## 2021-06-07 ENCOUNTER — Encounter: Payer: Self-pay | Admitting: Hematology and Oncology

## 2021-06-07 ENCOUNTER — Ambulatory Visit: Payer: 59 | Admitting: Gastroenterology

## 2021-06-07 VITALS — BP 111/59 | HR 68 | Temp 98.1°F | Resp 17 | Ht 67.0 in | Wt 202.3 lb

## 2021-06-07 VITALS — BP 108/64 | HR 68 | Ht 67.0 in | Wt 202.2 lb

## 2021-06-07 DIAGNOSIS — R718 Other abnormality of red blood cells: Secondary | ICD-10-CM | POA: Diagnosis not present

## 2021-06-07 DIAGNOSIS — D500 Iron deficiency anemia secondary to blood loss (chronic): Secondary | ICD-10-CM

## 2021-06-07 DIAGNOSIS — R110 Nausea: Secondary | ICD-10-CM

## 2021-06-07 DIAGNOSIS — Z862 Personal history of diseases of the blood and blood-forming organs and certain disorders involving the immune mechanism: Secondary | ICD-10-CM

## 2021-06-07 DIAGNOSIS — A048 Other specified bacterial intestinal infections: Secondary | ICD-10-CM

## 2021-06-07 DIAGNOSIS — Z8619 Personal history of other infectious and parasitic diseases: Secondary | ICD-10-CM

## 2021-06-07 DIAGNOSIS — D509 Iron deficiency anemia, unspecified: Secondary | ICD-10-CM

## 2021-06-07 DIAGNOSIS — R1013 Epigastric pain: Secondary | ICD-10-CM

## 2021-06-07 DIAGNOSIS — R12 Heartburn: Secondary | ICD-10-CM

## 2021-06-07 LAB — IRON AND IRON BINDING CAPACITY (CC-WL,HP ONLY)
Iron: 77 ug/dL (ref 28–170)
Saturation Ratios: 18 % (ref 10.4–31.8)
TIBC: 431 ug/dL (ref 250–450)
UIBC: 354 ug/dL (ref 148–442)

## 2021-06-07 LAB — CBC WITH DIFFERENTIAL (CANCER CENTER ONLY)
Abs Immature Granulocytes: 0.01 10*3/uL (ref 0.00–0.07)
Basophils Absolute: 0 10*3/uL (ref 0.0–0.1)
Basophils Relative: 0 %
Eosinophils Absolute: 0.1 10*3/uL (ref 0.0–0.5)
Eosinophils Relative: 1 %
HCT: 38 % (ref 36.0–46.0)
Hemoglobin: 11.9 g/dL — ABNORMAL LOW (ref 12.0–15.0)
Immature Granulocytes: 0 %
Lymphocytes Relative: 47 %
Lymphs Abs: 2.1 10*3/uL (ref 0.7–4.0)
MCH: 23.2 pg — ABNORMAL LOW (ref 26.0–34.0)
MCHC: 31.3 g/dL (ref 30.0–36.0)
MCV: 74.1 fL — ABNORMAL LOW (ref 80.0–100.0)
Monocytes Absolute: 0.3 10*3/uL (ref 0.1–1.0)
Monocytes Relative: 7 %
Neutro Abs: 2 10*3/uL (ref 1.7–7.7)
Neutrophils Relative %: 45 %
Platelet Count: 171 10*3/uL (ref 150–400)
RBC: 5.13 MIL/uL — ABNORMAL HIGH (ref 3.87–5.11)
RDW: 15 % (ref 11.5–15.5)
WBC Count: 4.5 10*3/uL (ref 4.0–10.5)
nRBC: 0 % (ref 0.0–0.2)

## 2021-06-07 LAB — RETIC PANEL
Immature Retic Fract: 18.8 % — ABNORMAL HIGH (ref 2.3–15.9)
RBC.: 5.14 MIL/uL — ABNORMAL HIGH (ref 3.87–5.11)
Retic Count, Absolute: 88.9 10*3/uL (ref 19.0–186.0)
Retic Ct Pct: 1.7 % (ref 0.4–3.1)
Reticulocyte Hemoglobin: 26.8 pg — ABNORMAL LOW (ref >27.9)

## 2021-06-07 LAB — CMP (CANCER CENTER ONLY)
ALT: 19 U/L (ref 0–44)
AST: 15 U/L (ref 15–41)
Albumin: 4.7 g/dL (ref 3.5–5.0)
Alkaline Phosphatase: 28 U/L — ABNORMAL LOW (ref 38–126)
Anion gap: 8 (ref 5–15)
BUN: 8 mg/dL (ref 6–20)
CO2: 27 mmol/L (ref 22–32)
Calcium: 10 mg/dL (ref 8.9–10.3)
Chloride: 103 mmol/L (ref 98–111)
Creatinine: 0.68 mg/dL (ref 0.44–1.00)
GFR, Estimated: >60 mL/min (ref >60)
Glucose, Bld: 88 mg/dL (ref 70–99)
Potassium: 4 mmol/L (ref 3.5–5.1)
Sodium: 138 mmol/L (ref 135–145)
Total Bilirubin: 0.5 mg/dL (ref 0.3–1.2)
Total Protein: 8 g/dL (ref 6.5–8.1)

## 2021-06-07 LAB — LIPASE: Lipase: 35 U/L (ref 11.0–59.0)

## 2021-06-07 LAB — AMYLASE: Amylase: 56 U/L (ref 27–131)

## 2021-06-07 LAB — FERRITIN: Ferritin: 20 ng/mL (ref 11–307)

## 2021-06-07 MED ORDER — ESOMEPRAZOLE MAGNESIUM 40 MG PO CPDR: 40.0000 mg | DELAYED_RELEASE_CAPSULE | Freq: Every day | ORAL | 1 refills | Status: DC

## 2021-06-07 MED ORDER — ONDANSETRON 4 MG PO TBDP: 4.0000 mg | ORAL_TABLET | Freq: Four times a day (QID) | ORAL | 2 refills | Status: DC | PRN

## 2021-06-07 NOTE — Progress Notes (Signed)
GASTROENTEROLOGY OUTPATIENT CLINIC VISIT   Primary Care Provider Janeece AgeeMorrow, Richard, NP [address] [place] [id number] [phone]   Patient Profile: Lauren Potter is a 31 y.o. female with a pmh significant for IDA, chronic constipation, H. pylori (pending eradication).  The patient presents to the [facility] for an evaluation and management of problem(s) noted below:  Problem List 1. Abdominal pain, epigastric   2. History of Helicobacter pylori infection   3. Nausea without vomiting   4. Pyrosis   5. History of iron deficiency anemia     History of Present Illness Please see prior notes for full details of HPI.  Interval  History The patient returns for a scheduled follow-up.  Patient states that she had been doing very well after the completion of her antibiotic therapy for H. pylori that was found on H. pylori stool antigen testing.  However, over the course of the last few months she has begun to experience similar symptoms of nausea once again.  She was unable to come back for H. pylori stool antigen testing for eradication and she is also no longer on PPI therapy.  She denies any overt melena or hematochezia.  She does have significant iron deficiency anemia and is being seen with our hematology colleagues and she has received intravenous iron in the past.  She does experience abdominal discomfort that still comes and goes at times though not as severe as it was previously.  She did have some alteration of bowel habits in November/December but those have improved back to her baseline.  She is not experiencing significant diarrhea or constipation currently.  GI Review of Systems Positive as above including bloating at times Negative for dysphagia, odynophagia, alteration of bowel habits  Review of Systems General: Denies fevers/chills/weight loss unintentionally Cardiovascular: Denies chest pain Pulmonary: Denies shortness of  breath Gastroenterological: See HPI Genitourinary: Denies darkened urine Hematological: Denies easy bruising/bleeding Dermatological: Denies jaundice Psychological: Mood is stable   Medications Current Outpatient Medications  Medication Sig Dispense Refill   esomeprazole (NEXIUM) 40 MG capsule Take 1 capsule (40 mg total) by mouth daily before breakfast. 90 capsule 1   ferrous sulfate (FERROUSUL) 325 (65 FE) MG tablet Take 1 tablet (325 mg total) by mouth 2 (two) times daily with a meal. 180 tablet 1   ondansetron (ZOFRAN-ODT) 4 MG disintegrating tablet Take 1 tablet (4 mg total) by mouth every 6 (six) hours as needed for nausea or vomiting. 30 tablet 2   traZODone (DESYREL) 50 MG tablet Take 0.5-1 tablets (25-50 mg total) by mouth at bedtime as needed for sleep. (Patient not taking: Reported on 04/18/2021) 30 tablet 3   No current facility-administered medications for this visit.    Allergies Allergies  Allergen Reactions   Dust Mite Extract    Other    Shellfish Allergy     Histories Past Medical History:  Diagnosis Date   Frequent sinus infections    Iron deficiency anemia    Medical history non-contributory    Past Surgical History:  Procedure Laterality Date   APPENDECTOMY     Social History   Socioeconomic History   Marital status: Married    Spouse name: Not on file   Number of children: Not on file   Years of education: Not on file   Highest education level: Not on file  Occupational History   Not on file  Tobacco Use   Smoking status: Never   Smokeless tobacco: Never  Vaping Use   Vaping Use: Never used  Substance and Sexual Activity   Alcohol use: No   Drug use: No   Sexual activity: Yes    Partners: Male    Birth control/protection: I.U.D.  Other Topics Concern   Not on file  Social History Narrative   Not on file   Social Determinants of Health   Financial Resource Strain: Not on file  Food Insecurity: Not on file  Transportation Needs:  Not on file  Physical Activity: Not on file  Stress: Not on file  Social Connections: Not on file  Intimate Partner Violence: Not on file   Family History  Problem Relation Age of Onset   Diabetes Mother    Hypertension Father    Diabetes Maternal Grandmother    Diabetes Maternal Grandfather    Hypertension Paternal Grandmother    Colon cancer Neg Hx    Esophageal cancer Neg Hx    Inflammatory bowel disease Neg Hx    Liver disease Neg Hx    Pancreatic cancer Neg Hx    Rectal cancer Neg Hx    Stomach cancer Neg Hx    I have reviewed her medical, social, and family history in detail and updated the electronic medical record as necessary.    PHYSICAL EXAMINATION  BP 108/64    Pulse 68    Ht 5\' 7"  (1.702 m)    Wt 202 lb 3.2 oz (91.7 kg)    BMI 31.67 kg/m  Wt Readings from Last 3 Encounters:  06/07/21 202 lb 4.8 oz (91.8 kg)  06/07/21 202 lb 3.2 oz (91.7 kg)  04/18/21 198 lb 9.6 oz (90.1 kg)  GEN: NAD, appears stated age, doesn't appear chronically ill PSYCH: Cooperative, without pressured speech EYE: Conjunctivae pink, sclerae anicteric ENT: MMM CV: Nontachycardic RESP: No audible wheezing GI: NABS, soft, NT/ND, without rebound or guarding MSK/EXT: No significant lower extremity edema SKIN: No jaundice NEURO:  Alert & Oriented x 3, no focal deficits   REVIEW OF DATA  I reviewed the following data at the time of this encounter:  GI Procedures and Studies  No relevant studies to review  Laboratory Studies  Reviewed those in epic  Imaging Studies  No relevant studies to review   ASSESSMENT  Lauren Potter is a 31 y.o. female with a pmh significant for IDA, chronic constipation, H. pylori (pending eradication).  The patient is seen today for evaluation and management of:  1. Abdominal pain, epigastric   2. History of Helicobacter pylori infection   3. Nausea without vomiting   4. Pyrosis   5. History of iron deficiency anemia    The patient is hemodynamically and  clinically stable.  She does have evidence of previous H. pylori infection and has been doing pretty well after her treatment but seems to have developed some of those symptoms once again occluding nausea and infrequent abdominal discomfort.  We are going to have her go ahead and get H. pylori stool antigen testing and when she is given that she will restart on a PPI once daily.  We will see how the patient does over the course of the next few weeks.  If she continues to have symptoms and she is negative for H. pylori, then additional testing including ultrasound imaging as well as an endoscopy will need to be considered.  We had previously considered a colonoscopy to be done at the same time due to symptoms.  She is not having those symptoms at this time.  However, she does have a significant IDA and there is a general recommendation to consider a diagnostic colonoscopy in these individuals, though with her menorrhagia this is the most likely etiology for her symptoms of IDA.  Thus for now, we are holding on colonoscopy unless something else changes or our hematology colleagues feel we need to rule that out.  All patient questions were answered to the best of my ability, and the patient agrees to the aforementioned plan of action with follow-up as indicated.   PLAN  Laboratories as outlined below H. pylori stool antigen to ensure eradication has been ordered After H. pylori stool antigen has been given then may initiate Nexium 40 mg daily Zofran 4 mg ODT as needed every 8 hours Abdominal ultrasound to be obtained May consider diagnostic endoscopy pending how the patient does in the coming weeks Colonoscopy can be considered in the setting of her significant IDA though this is most likely a result of her menorrhagia  Orders Placed This Encounter  Procedures   Helicobacter pylori special antigen   Amylase   Lipase    New Prescriptions   ESOMEPRAZOLE (NEXIUM) 40 MG CAPSULE    Take 1 capsule (40 mg  total) by mouth daily before breakfast.   ONDANSETRON (ZOFRAN-ODT) 4 MG DISINTEGRATING TABLET    Take 1 tablet (4 mg total) by mouth every 6 (six) hours as needed for nausea or vomiting.   Modified Medications   No medications on file    Planned Follow Up No follow-ups on file.   Total Time in Face-to-Face and in Coordination of Care for patient including independent/personal interpretation/review of prior testing, medical history, examination, medication adjustment, communicating results with the patient directly, and documentation with the EHR is 25 minutes.   Corliss Parish, MD [facility] Office # [phone]

## 2021-06-07 NOTE — Progress Notes (Signed)
[place] [phone]   [phone]  PROGRESS NOTE  Patient Care Team: Maximiano Coss, NP as PCP - General (Adult Health Nurse Practitioner) Patient, [name] (Inactive) (General Practice)  Hematological/Oncological History # Microcytosis  1) 08/08/2014: WBC 6.5, Hgb 12.1, MCV 69.4, Plt 167 2) 07/26/2016: WBC 5.0, Hgb 10.4, MCV 64, Plt 235 3) 12/30/2016: WBC 5.8, Hgb 11.3, MCV 71, Plt 165 4) 05/13/2017: WBC 5.7, Hgb 12.0, MCV 71.5, Plt 122 5) 07/26/2019: WBC 3.2, Hgb 12.0, MCV 77 6) 09/22/2019: WBC 3.8, Hgb 12.3, MCV 73, Plt 170. Iron 70, Sat 15%, Ferritin 8.9.  7) 11/14/2019: establish care with Dr. Lorenso Courier  8) 03/31/2020-05/11/2020: IV iron sucrose 200 mg x 5 doses 9) 06/07/2021: WBC 4.5, Hgb 11.9, MCV 74.1, Plt 171  Interval History:  Lauren Potter 31 y.o. female with medical history significant for microcytosis who presents for a follow up visit. The patient's last visit was on 03/15/2020. In the interim since the last visit she has continued PO ferrous sulfate 3100m daily.  On exam today Lauren Potter she was evaluated by the gastroenterologist today.  She notes that she is currently taking a iron pill every single day and started this in December 2022 after she was seen by her OB/GYN provider.  She notes that her hair has "started falling out again".  She notes that GI visit was predominantly for nausea for which they are currently testing her for H. pylori.  She also notes that she was started on acid reflux medication and they are considering a colonoscopy.  She notes that she sometimes feels depleted like she has low energy levels.  She notes that her last menstrual cycle lasted approximately 3 days with out being particularly heavy.  She notes that on the first day she typically goes to about 3 pads per day.  She is not having any dizziness or lightheadedness.  Otherwise she is had no other overt signs of bleeding such as nosebleeds, or dark stools.   She currently denies having issues with fevers, chills, sweats, nausea, or diarrhea.  A full 10 point ROS is listed below.  MEDICAL HISTORY:  Past Medical History:  Diagnosis Date   Frequent sinus infections    Iron deficiency anemia    Medical history non-contributory     SURGICAL HISTORY: Past Surgical History:  Procedure Laterality Date   APPENDECTOMY      SOCIAL HISTORY: Social History   Socioeconomic History   Marital status: Married    Spouse name: Not on file   Number of children: Not on file   Years of education: Not on file   Highest education level: Not on file  Occupational History   Not on file  Tobacco Use   Smoking status: Never   Smokeless tobacco: Never  Vaping Use   Vaping Use: Never used  Substance and Sexual Activity   Alcohol use: No   Drug use: No   Sexual activity: Yes    Partners: Male    Birth control/protection: I.U.D.  Other Topics Concern   Not on file  Social History Narrative   Not on file   Social Determinants of Health   Financial Resource Strain: Not on file  Food Insecurity: Not on file  Transportation Needs: Not on file  Physical Activity: Not on file  Stress: Not on file  Social Connections: Not on file  Intimate Partner Violence: Not on file    FAMILY HISTORY: Family History  Problem Relation Age  of Onset   Diabetes Mother    Hypertension Father    Diabetes Maternal Grandmother    Diabetes Maternal Grandfather    Hypertension Paternal Grandmother    Colon cancer Neg Hx    Esophageal cancer Neg Hx    Inflammatory bowel disease Neg Hx    Liver disease Neg Hx    Pancreatic cancer Neg Hx    Rectal cancer Neg Hx    Stomach cancer Neg Hx     ALLERGIES:  is allergic to dust mite extract, other, and shellfish allergy.  MEDICATIONS:  Current Outpatient Medications  Medication Sig Dispense Refill   esomeprazole (NEXIUM) 40 MG capsule Take 1 capsule (40 mg total) by mouth daily before breakfast. 90 capsule 1    ferrous sulfate (FERROUSUL) 325 (65 FE) MG tablet Take 1 tablet (325 mg total) by mouth 2 (two) times daily with a meal. 180 tablet 1   ondansetron (ZOFRAN-ODT) 4 MG disintegrating tablet Take 1 tablet (4 mg total) by mouth every 6 (six) hours as needed for nausea or vomiting. 30 tablet 2   traZODone (DESYREL) 50 MG tablet Take 0.5-1 tablets (25-50 mg total) by mouth at bedtime as needed for sleep. (Patient not taking: Reported on 04/18/2021) 30 tablet 3   No current facility-administered medications for this visit.    REVIEW OF SYSTEMS:   Constitutional: ( - ) fevers, ( - )  chills , ( - ) night sweats Eyes: ( - ) blurriness of vision, ( - ) double vision, ( - ) watery eyes Ears, nose, mouth, throat, and face: ( - ) mucositis, ( - ) sore throat Respiratory: ( - ) cough, ( - ) dyspnea, ( - ) wheezes Cardiovascular: ( - ) palpitation, ( - ) chest discomfort, ( - ) lower extremity swelling Gastrointestinal:  ( - ) nausea, ( - ) heartburn, ( - ) change in bowel habits Skin: ( - ) abnormal skin rashes Lymphatics: ( - ) new lymphadenopathy, ( - ) easy bruising Neurological: ( - ) numbness, ( - ) tingling, ( - ) new weaknesses Behavioral/Psych: ( - ) mood change, ( - ) new changes  All other systems were reviewed with the patient and are negative.  PHYSICAL EXAMINATION:  Vitals:   06/07/21 1350  BP: (!) 111/59  Pulse: 68  Resp: 17  Temp: 98.1 F (36.7 C)  SpO2: 100%   Filed Weights   06/07/21 1350  Weight: 202 lb 4.8 oz (91.8 kg)    GENERAL: well appearing young African American female in NAD  SKIN: skin color, texture, turgor are normal, no rashes or significant lesions EYES: conjunctiva are pink and non-injected, sclera clear LUNGS: clear to auscultation and percussion with normal breathing effort HEART: regular rate & rhythm and no murmurs and no lower extremity edema Musculoskeletal: no cyanosis of digits and no clubbing  PSYCH: alert & oriented x 3, fluent speech NEURO: no  focal motor/sensory deficits  LABORATORY DATA:  I have reviewed the data as listed CBC Latest Ref Rng & Units 06/07/2021 04/19/2021 03/15/2020  WBC 4.0 - 10.5 K/uL 4.5 3.5(L) 3.1(L)  Hemoglobin 12.0 - 15.0 g/dL 11.9(L) 11.7(L) 11.3(L)  Hematocrit 36.0 - 46.0 % 38.0 36.8 36.2  Platelets 150 - 400 K/uL 171 161.0 129(L)    CMP Latest Ref Rng & Units 03/15/2020 11/14/2019 07/26/2019  Glucose 70 - 99 mg/dL 98 96 83  BUN 6 - 20 mg/dL '6 10 7  ' Creatinine 0.44 - 1.00 mg/dL 0.75 0.78 0.66  Sodium 135 - 145 mmol/L 142 142 139  Potassium 3.5 - 5.1 mmol/L 4.0 4.7 4.1  Chloride 98 - 111 mmol/L 109 109 103  CO2 22 - 32 mmol/L '28 25 22  ' Calcium 8.9 - 10.3 mg/dL 9.0 10.0 9.4  Total Protein 6.5 - 8.1 g/dL 6.5 7.8 7.1  Total Bilirubin 0.3 - 1.2 mg/dL 0.3 0.3 0.4  Alkaline Phos 38 - 126 U/L 23(L) 30(L) 32(L)  AST 15 - 41 U/L 14(L) 18 12  ALT 0 - 44 U/L '13 23 10   ' RADIOGRAPHIC STUDIES: No results found.  ASSESSMENT & PLAN Lauren Potter 31 y.o. female with medical history significant for microcytosis who presents for a follow up visit.    After review the labs, the records, discussion with the patient the findings most consistent with a mild iron deficiency anemia and microcytosis.  The patient has been taking p.o. iron therapy without much of a boost in her energy level.   Overall I recommend that we continue with the p.o. iron therapy and if no improvement is seen in her counts with a recheck in 6 month's time we consider ministration of IV iron.   In the event this is ineffective we would need to consider bone marrow biopsy, though pulmonary dysfunction causing microcytosis is relatively uncommon finding.  # Microcytosis # History of Iron Deficiency   -- long history of microcytosis but Hgb cascade showed no evidence of  thalassemia or other hemoglobinopathy causing this finding. --prior iron panel, ferritin at 20  --continue PO iron ferrous sulfate 322m daily (or every other day if patient is  not tolerating iron) with a source of vitamin C --the use of IV iron could be considered if patient has the inability to tolerate PO iron therapy or it fails to maintain her Hgb  --RTC in 6 months to re-evaluate or sooner if further workup is required.   No orders of the defined types were placed in this encounter.   All questions were answered. The patient knows to call the clinic with any problems, questions or concerns.  A total of more than 30 minutes were spent on this encounter and over half of that time was spent on counseling and coordination of care as outlined above.   JLedell Peoples MD Department of Hematology/Oncology [facility]: [phone]: [phone]: [email or web address]  06/07/2021 2:04 PM

## 2021-06-07 NOTE — Patient Instructions (Addendum)
Your provider has requested that you go to the basement level for lab work before leaving today. Press "B" on the elevator. The lab is located at the first door on the left as you exit the elevator.  After you have submitted stool study you may resume Nexium 40mg  - Take 1 capsules before breakfast daily.   We have sent the following medications to your pharmacy for you to pick up at your convenience:Nexium, Zofran   Please follow-up on: 07/19/21 at 1:50pm  If you are age 4 or older, your body mass index should be between 23-30. Your Body mass index is 31.67 kg/m. If this is out of the aforementioned range listed, please consider follow up with your Primary Care Provider.  If you are age 59 or younger, your body mass index should be between 19-25. Your Body mass index is 31.67 kg/m. If this is out of the aformentioned range listed, please consider follow up with your Primary Care Provider.   ________________________________________________________  The [place] [facility] providers would like to encourage you to use [facility] to communicate with providers for non-urgent requests or questions.  Due to long hold times on the telephone, sending your provider a message by [facility] may be a faster and more efficient way to get a response.  Please allow 48 business hours for a response.  Please remember that this is for non-urgent requests.  _______________________________________________________  Thank you for choosing me and [facility].  Dr. [facility]

## 2021-06-10 ENCOUNTER — Encounter: Payer: Self-pay | Admitting: Gastroenterology

## 2021-06-10 DIAGNOSIS — R1013 Epigastric pain: Secondary | ICD-10-CM | POA: Insufficient documentation

## 2021-06-10 DIAGNOSIS — R110 Nausea: Secondary | ICD-10-CM | POA: Insufficient documentation

## 2021-06-10 DIAGNOSIS — R12 Heartburn: Secondary | ICD-10-CM | POA: Insufficient documentation

## 2021-06-10 DIAGNOSIS — A048 Other specified bacterial intestinal infections: Secondary | ICD-10-CM | POA: Insufficient documentation

## 2021-06-10 LAB — HELICOBACTER PYLORI  SPECIAL ANTIGEN
MICRO NUMBER:: 12961162
SPECIMEN QUALITY: ADEQUATE

## 2021-06-11 ENCOUNTER — Encounter: Payer: Self-pay | Admitting: Hematology and Oncology

## 2021-06-12 ENCOUNTER — Telehealth: Payer: Self-pay | Admitting: Hematology and Oncology

## 2021-06-12 NOTE — Telephone Encounter (Signed)
Sch per 2/7 inbasket, left msg °

## 2021-07-19 ENCOUNTER — Ambulatory Visit: Payer: 59 | Admitting: Gastroenterology

## 2021-08-19 ENCOUNTER — Telehealth: Payer: Self-pay | Admitting: Hematology and Oncology

## 2021-08-19 NOTE — Telephone Encounter (Signed)
Per provider called pt and left message with details of reschedule.  Call back number left if changes are needed  ?

## 2021-09-05 DIAGNOSIS — Z124 Encounter for screening for malignant neoplasm of cervix: Secondary | ICD-10-CM | POA: Diagnosis not present

## 2021-09-05 DIAGNOSIS — Z6831 Body mass index (BMI) 31.0-31.9, adult: Secondary | ICD-10-CM | POA: Diagnosis not present

## 2021-09-05 DIAGNOSIS — Z8759 Personal history of other complications of pregnancy, childbirth and the puerperium: Secondary | ICD-10-CM | POA: Diagnosis not present

## 2021-09-05 DIAGNOSIS — Z01411 Encounter for gynecological examination (general) (routine) with abnormal findings: Secondary | ICD-10-CM | POA: Diagnosis not present

## 2021-09-06 ENCOUNTER — Other Ambulatory Visit: Payer: 59

## 2021-09-06 ENCOUNTER — Ambulatory Visit: Payer: 59 | Admitting: Hematology and Oncology

## 2021-09-12 ENCOUNTER — Encounter: Payer: Self-pay | Admitting: Registered Nurse

## 2021-09-12 ENCOUNTER — Inpatient Hospital Stay: Payer: 59 | Admitting: Hematology and Oncology

## 2021-09-12 ENCOUNTER — Other Ambulatory Visit: Payer: Self-pay

## 2021-09-12 ENCOUNTER — Inpatient Hospital Stay: Payer: 59 | Attending: Hematology and Oncology

## 2021-09-12 ENCOUNTER — Other Ambulatory Visit: Payer: Self-pay | Admitting: Hematology and Oncology

## 2021-09-12 ENCOUNTER — Ambulatory Visit: Payer: 59 | Admitting: Registered Nurse

## 2021-09-12 VITALS — BP 121/65 | HR 71 | Temp 97.6°F | Resp 18 | Ht 67.0 in | Wt 203.2 lb

## 2021-09-12 VITALS — BP 118/64 | HR 61 | Temp 98.2°F | Resp 18 | Ht 67.0 in | Wt 201.0 lb

## 2021-09-12 DIAGNOSIS — R600 Localized edema: Secondary | ICD-10-CM

## 2021-09-12 DIAGNOSIS — R718 Other abnormality of red blood cells: Secondary | ICD-10-CM

## 2021-09-12 DIAGNOSIS — M7989 Other specified soft tissue disorders: Secondary | ICD-10-CM | POA: Diagnosis not present

## 2021-09-12 DIAGNOSIS — D509 Iron deficiency anemia, unspecified: Secondary | ICD-10-CM | POA: Diagnosis not present

## 2021-09-12 DIAGNOSIS — D500 Iron deficiency anemia secondary to blood loss (chronic): Secondary | ICD-10-CM

## 2021-09-12 LAB — CBC WITH DIFFERENTIAL (CANCER CENTER ONLY)
Abs Immature Granulocytes: 0 10*3/uL (ref 0.00–0.07)
Basophils Absolute: 0 10*3/uL (ref 0.0–0.1)
Basophils Relative: 0 %
Eosinophils Absolute: 0.1 10*3/uL (ref 0.0–0.5)
Eosinophils Relative: 2 %
HCT: 36.3 % (ref 36.0–46.0)
Hemoglobin: 11.9 g/dL — ABNORMAL LOW (ref 12.0–15.0)
Immature Granulocytes: 0 %
Lymphocytes Relative: 40 %
Lymphs Abs: 1.6 10*3/uL (ref 0.7–4.0)
MCH: 23.7 pg — ABNORMAL LOW (ref 26.0–34.0)
MCHC: 32.8 g/dL (ref 30.0–36.0)
MCV: 72.3 fL — ABNORMAL LOW (ref 80.0–100.0)
Monocytes Absolute: 0.3 10*3/uL (ref 0.1–1.0)
Monocytes Relative: 7 %
Neutro Abs: 2 10*3/uL (ref 1.7–7.7)
Neutrophils Relative %: 51 %
Platelet Count: 151 10*3/uL (ref 150–400)
RBC: 5.02 MIL/uL (ref 3.87–5.11)
RDW: 15.2 % (ref 11.5–15.5)
WBC Count: 4 10*3/uL (ref 4.0–10.5)
nRBC: 0 % (ref 0.0–0.2)

## 2021-09-12 LAB — CMP (CANCER CENTER ONLY)
ALT: 11 U/L (ref 0–44)
AST: 11 U/L — ABNORMAL LOW (ref 15–41)
Albumin: 4.3 g/dL (ref 3.5–5.0)
Alkaline Phosphatase: 24 U/L — ABNORMAL LOW (ref 38–126)
Anion gap: 5 (ref 5–15)
BUN: 13 mg/dL (ref 6–20)
CO2: 26 mmol/L (ref 22–32)
Calcium: 9.5 mg/dL (ref 8.9–10.3)
Chloride: 104 mmol/L (ref 98–111)
Creatinine: 0.69 mg/dL (ref 0.44–1.00)
GFR, Estimated: >60 mL/min (ref >60)
Glucose, Bld: 98 mg/dL (ref 70–99)
Potassium: 3.8 mmol/L (ref 3.5–5.1)
Sodium: 135 mmol/L (ref 135–145)
Total Bilirubin: 0.3 mg/dL (ref 0.3–1.2)
Total Protein: 7.7 g/dL (ref 6.5–8.1)

## 2021-09-12 LAB — IRON AND IRON BINDING CAPACITY (CC-WL,HP ONLY)
Iron: 60 ug/dL (ref 28–170)
Saturation Ratios: 16 % (ref 10.4–31.8)
TIBC: 370 ug/dL (ref 250–450)
UIBC: 310 ug/dL (ref 148–442)

## 2021-09-12 LAB — RETIC PANEL
Immature Retic Fract: 14.3 % (ref 2.3–15.9)
RBC.: 5.03 MIL/uL (ref 3.87–5.11)
Retic Count, Absolute: 79.5 10*3/uL (ref 19.0–186.0)
Retic Ct Pct: 1.6 % (ref 0.4–3.1)
Reticulocyte Hemoglobin: 27.3 pg — ABNORMAL LOW (ref >27.9)

## 2021-09-12 NOTE — Progress Notes (Signed)
? ?Acute Office Visit ? ?Subjective:  ? ? Patient ID: Lauren Potter, female    DOB: 1991/03/12, 31 y.o.   MRN: [id number] ? ?Chief Complaint  ?Patient presents with  ? Foot Swelling  ?  Patient states she is here for swollen feet for the last 2 days .  ? ? ?HPI ?Patient is in today for feet swelling ? ?Both feet. Ongoing past 1-2 days ? ?Can feel the swelling starting, legs feel heavy, but no pain. ?Does not seem to have a clear pattern. Comes and goes. ?No changes to diet, exercise, lifestyle.  ? ?Does note some intermittent dizziness. Has been overdue for new glasses rx. ?Occ lightheadedness. Neither this nor dizziness have happened at all since swelling started.  ? ?Outpatient Medications Prior to Visit  ?Medication Sig Dispense Refill  ? esomeprazole (NEXIUM) 40 MG capsule Take 1 capsule (40 mg total) by mouth daily before breakfast. (Patient not taking: Reported on 09/12/2021) 90 capsule 1  ? ferrous sulfate (FERROUSUL) 325 (65 FE) MG tablet Take 1 tablet (325 mg total) by mouth 2 (two) times daily with a meal. (Patient not taking: Reported on 09/12/2021) 180 tablet 1  ? ondansetron (ZOFRAN-ODT) 4 MG disintegrating tablet Take 1 tablet (4 mg total) by mouth every 6 (six) hours as needed for nausea or vomiting. (Patient not taking: Reported on 09/12/2021) 30 tablet 2  ? traZODone (DESYREL) 50 MG tablet Take 0.5-1 tablets (25-50 mg total) by mouth at bedtime as needed for sleep. (Patient not taking: Reported on 04/18/2021) 30 tablet 3  ? ?No facility-administered medications prior to visit.  ? ? ?Review of Systems  ?Constitutional: Negative.   ?HENT: Negative.    ?Eyes: Negative.   ?Respiratory: Negative.    ?Cardiovascular: Negative.   ?Gastrointestinal: Negative.   ?Genitourinary: Negative.   ?Musculoskeletal: Negative.   ?Skin: Negative.   ?Neurological: Negative.   ?Psychiatric/Behavioral: Negative.    ?All other systems reviewed and are negative. ? ?   ?Objective:  ?  ?BP 118/64   Pulse 61   Temp 98.2 ?F  (36.8 ?C) (Temporal)   Resp 18   Ht 5\' 7"  (1.702 m)   Wt 201 lb (91.2 kg)   SpO2 100%   BMI 31.48 kg/m?  ?Physical Exam ?Vitals and nursing note reviewed.  ?Constitutional:   ?   General: She is not in acute distress. ?   Appearance: Normal appearance. She is normal weight. She is not ill-appearing, toxic-appearing or diaphoretic.  ?Cardiovascular:  ?   Rate and Rhythm: Normal rate and regular rhythm.  ?   Heart sounds: Normal heart sounds. No murmur heard. ?  No friction rub. No gallop.  ?Pulmonary:  ?   Effort: Pulmonary effort is normal. No respiratory distress.  ?   Breath sounds: Normal breath sounds. No stridor. No wheezing, rhonchi or rales.  ?Chest:  ?   Chest wall: No tenderness.  ?Musculoskeletal:     ?   General: Swelling present. Normal range of motion.  ?   Right lower leg: Edema (mild nonpitting) present.  ?   Left lower leg: Edema (mild nonpitting) present.  ?Skin: ?   General: Skin is warm and dry.  ?   Capillary Refill: Capillary refill takes less than 2 seconds.  ?   Coloration: Skin is not jaundiced or pale.  ?   Findings: No bruising, erythema, lesion or rash.  ?Neurological:  ?   General: No focal deficit present.  ?   Mental Status: She is  alert and oriented to person, place, and time. Mental status is at baseline.  ?Psychiatric:     ?   Mood and Affect: Mood normal.     ?   Behavior: Behavior normal.     ?   Thought Content: Thought content normal.     ?   Judgment: Judgment normal.  ? ? ?No results found for any visits on 09/12/21. ? ? ?   ?Assessment & Plan:  ?1. Leg swelling ?- D-Dimer, Quantitative ?- US Venous Img Lower Unilateral Left (DVT); Future ?- CBC with Differential/Platelet ?- Comprehensive metabolic panel ?- TSH ? ?2. Edema of left lower leg ?- D-Dimer, Quantitative ?- US Venous Img Lower Unilateral Left (DVT); Future ?- CBC with Differential/Platelet ?- Comprehensive metabolic panel ?- TSH ? ? ? ?No orders of the defined types were placed in this encounter. ? ? ?Return if  symptoms worsen or fail to improve. ? ?PLAN ?Unclear etiology. Swelling is mild, no heat or warmth is reassuring re: clot, but will order d-dimer and [place] to rule out.  ?Check CBC. Had been recovering well from anemia and doubt cardiac origin ?CMP and TSH - follow up as indicated ?Advised hydration, regular activity, stretching, elevation ?Return precautions given ?Patient encouraged to call clinic with any questions, comments, or concerns. ? ? ?Janeece Agee, NP ?

## 2021-09-12 NOTE — Patient Instructions (Addendum)
Lauren Potter - ? ?Great to see you ? ?I advise hydration with about 3L of water throughout the day, regular activity, and elevation of legs ? ?We'll work to rule out a clot and other underlying causes. ? ?I'll be in touch with results ? ?Thanks, ? ?Rich  ? ? ? ?If you have lab work done today you will be contacted with your lab results within the next 2 weeks.  If you have not heard from [place] then please contact us. The fastest way to get your results is to register for My Chart. ? ? ?IF you received an x-ray today, you will receive an invoice from [facility]. Please contact [facility] at [phone] with questions or concerns regarding your invoice.  ? ?IF you received labwork today, you will receive an invoice from [facility]. Please contact [facility] at [phone] with questions or concerns regarding your invoice.  ? ?Our billing staff will not be able to assist you with questions regarding bills from these companies. ? ?You will be contacted with the lab results as soon as they are available. The fastest way to get your results is to activate your My Chart account. Instructions are located on the last page of this paperwork. If you have not heard from [place] regarding the results in 2 weeks, please contact this office. ?  ? ? ?

## 2021-09-12 NOTE — Addendum Note (Signed)
Addended by: Janeece Agee on: 09/12/2021 12:13 PM ? ? Modules accepted: Orders ? ?

## 2021-09-13 LAB — FERRITIN: Ferritin: 23 ng/mL (ref 11–307)

## 2021-09-27 ENCOUNTER — Other Ambulatory Visit (INDEPENDENT_AMBULATORY_CARE_PROVIDER_SITE_OTHER): Payer: 59

## 2021-09-27 ENCOUNTER — Ambulatory Visit: Payer: 59 | Admitting: Gastroenterology

## 2021-09-27 VITALS — BP 128/52 | HR 72 | Ht 67.0 in | Wt 208.0 lb

## 2021-09-27 DIAGNOSIS — R600 Localized edema: Secondary | ICD-10-CM | POA: Diagnosis not present

## 2021-09-27 DIAGNOSIS — R110 Nausea: Secondary | ICD-10-CM | POA: Diagnosis not present

## 2021-09-27 DIAGNOSIS — Z8619 Personal history of other infectious and parasitic diseases: Secondary | ICD-10-CM

## 2021-09-27 DIAGNOSIS — R1013 Epigastric pain: Secondary | ICD-10-CM

## 2021-09-27 DIAGNOSIS — M7989 Other specified soft tissue disorders: Secondary | ICD-10-CM

## 2021-09-27 LAB — COMPREHENSIVE METABOLIC PANEL
ALT: 15 U/L (ref 0–35)
AST: 12 U/L (ref 0–37)
Albumin: 4.3 g/dL (ref 3.5–5.2)
Alkaline Phosphatase: 22 U/L — ABNORMAL LOW (ref 39–117)
BUN: 11 mg/dL (ref 6–23)
CO2: 27 mEq/L (ref 19–32)
Calcium: 9.8 mg/dL (ref 8.4–10.5)
Chloride: 102 mEq/L (ref 96–112)
Creatinine, Ser: 0.7 mg/dL (ref 0.40–1.20)
GFR: 116.47 mL/min (ref >60.00)
Glucose, Bld: 88 mg/dL (ref 70–99)
Potassium: 3.9 mEq/L (ref 3.5–5.1)
Sodium: 137 mEq/L (ref 135–145)
Total Bilirubin: 0.3 mg/dL (ref 0.2–1.2)
Total Protein: 7.2 g/dL (ref 6.0–8.3)

## 2021-09-27 LAB — CBC WITH DIFFERENTIAL/PLATELET
Basophils Absolute: 0 10*3/uL (ref 0.0–0.1)
Basophils Relative: 0.5 % (ref 0.0–3.0)
Eosinophils Absolute: 0.1 10*3/uL (ref 0.0–0.7)
Eosinophils Relative: 1.3 % (ref 0.0–5.0)
HCT: 37 % (ref 36.0–46.0)
Hemoglobin: 12 g/dL (ref 12.0–15.0)
Lymphocytes Relative: 35.3 % (ref 12.0–46.0)
Lymphs Abs: 1.7 10*3/uL (ref 0.7–4.0)
MCHC: 32.5 g/dL (ref 30.0–36.0)
MCV: 72.2 fl — ABNORMAL LOW (ref 78.0–100.0)
Monocytes Absolute: 0.5 10*3/uL (ref 0.1–1.0)
Monocytes Relative: 9.9 % (ref 3.0–12.0)
Neutro Abs: 2.5 10*3/uL (ref 1.4–7.7)
Neutrophils Relative %: 53 % (ref 43.0–77.0)
Platelets: 155 10*3/uL (ref 150.0–400.0)
RBC: 5.12 Mil/uL — ABNORMAL HIGH (ref 3.87–5.11)
RDW: 15.6 % — ABNORMAL HIGH (ref 11.5–15.5)
WBC: 4.8 10*3/uL (ref 4.0–10.5)

## 2021-09-27 LAB — TSH: TSH: 1.17 u[IU]/mL (ref 0.35–5.50)

## 2021-09-27 MED ORDER — ONDANSETRON 4 MG PO TBDP
4.0000 mg | ORAL_TABLET | Freq: Four times a day (QID) | ORAL | 3 refills | Status: DC | PRN
Start: 2021-09-27 — End: 2022-05-22

## 2021-09-27 NOTE — Progress Notes (Unsigned)
GASTROENTEROLOGY OUTPATIENT CLINIC VISIT   Primary Care Provider Janeece Agee, NP [address] [place] [address] [phone]   Patient Profile: Lauren Potter is a 31 y.o. female with a pmh significant for IDA, chronic constipation, H. pylori (pending eradication).  The patient presents to the [facility] for an evaluation and management of problem(s) noted below:  Problem List No diagnosis found.   History of Present Illness Please see prior notes for full details of HPI.  Interval  History The patient returns for a scheduled follow-up.  Patient states that she had been doing very well after the completion of her antibiotic therapy for H. pylori that was found on H. pylori stool antigen testing.  However, over the course of the last few months she has begun to experience similar symptoms of nausea once again.  She was unable to come back for H. pylori stool antigen testing for eradication and she is also no longer on PPI therapy.  She denies any overt melena or hematochezia.  She does have significant iron deficiency anemia and is being seen with our hematology colleagues and she has received intravenous iron in the past.  She does experience abdominal discomfort that still comes and goes at times though not as severe as it was previously.  She did have some alteration of bowel habits in November/December but those have improved back to her baseline.  She is not experiencing significant diarrhea or constipation currently.  GI Review of Systems Positive as above including bloating at times Negative for dysphagia, odynophagia, alteration of bowel habits  Review of Systems General: Denies fevers/chills/weight loss unintentionally Cardiovascular: Denies chest pain Pulmonary: Denies shortness of breath Gastroenterological: See HPI Genitourinary: Denies darkened urine Hematological: Denies easy bruising/bleeding Dermatological: Denies  jaundice Psychological: Mood is stable   Medications No current outpatient medications on file.   No current facility-administered medications for this visit.    Allergies Allergies  Allergen Reactions   Dust Mite Extract    Other    Shellfish Allergy     Histories Past Medical History:  Diagnosis Date   Frequent sinus infections    Iron deficiency anemia    Medical history non-contributory    Past Surgical History:  Procedure Laterality Date   APPENDECTOMY     Social History   Socioeconomic History   Marital status: Married    Spouse name: Not on file   Number of children: Not on file   Years of education: Not on file   Highest education level: Not on file  Occupational History   Not on file  Tobacco Use   Smoking status: Never   Smokeless tobacco: Never  Vaping Use   Vaping Use: Never used  Substance and Sexual Activity   Alcohol use: No   Drug use: No   Sexual activity: Yes    Partners: Male    Birth control/protection: I.U.D.  Other Topics Concern   Not on file  Social History Narrative   Not on file   Social Determinants of Health   Financial Resource Strain: Not on file  Food Insecurity: Not on file  Transportation Needs: Not on file  Physical Activity: Not on file  Stress: Not on file  Social Connections: Not on file  Intimate Partner Violence: Not on file   Family History  Problem Relation Age of Onset   Diabetes Mother    Hypertension Father    Diabetes Maternal Grandmother    Diabetes Maternal Grandfather  Hypertension Paternal Grandmother    Colon cancer Neg Hx    Esophageal cancer Neg Hx    Inflammatory bowel disease Neg Hx    Liver disease Neg Hx    Pancreatic cancer Neg Hx    Rectal cancer Neg Hx    Stomach cancer Neg Hx    I have reviewed her medical, social, and family history in detail and updated the electronic medical record as necessary.    PHYSICAL EXAMINATION  Wt 208 lb (94.3 kg)   BMI 32.58 kg/m  Wt  Readings from Last 3 Encounters:  09/27/21 208 lb (94.3 kg)  09/12/21 203 lb 3.2 oz (92.2 kg)  09/12/21 201 lb (91.2 kg)  GEN: NAD, appears stated age, doesn't appear chronically ill PSYCH: Cooperative, without pressured speech EYE: Conjunctivae pink, sclerae anicteric ENT: MMM CV: Nontachycardic RESP: No audible wheezing GI: NABS, soft, NT/ND, without rebound or guarding MSK/EXT: No significant lower extremity edema SKIN: No jaundice NEURO:  Alert & Oriented x 3, no focal deficits   REVIEW OF DATA  I reviewed the following data at the time of this encounter:  GI Procedures and Studies  No relevant studies to review  Laboratory Studies  Reviewed those in epic  Imaging Studies  No relevant studies to review   ASSESSMENT  [name] is a 31 y.o. female with a pmh significant for IDA, chronic constipation, H. pylori (pending eradication).  The patient is seen today for evaluation and management of:  No diagnosis found.  The patient is hemodynamically and clinically stable.  She does have evidence of previous H. pylori infection and has been doing pretty well after her treatment but seems to have developed some of those symptoms once again occluding nausea and infrequent abdominal discomfort.  We are going to have her go ahead and get H. pylori stool antigen testing and when she is given that she will restart on a PPI once daily.  We will see how the patient does over the course of the next few weeks.  If she continues to have symptoms and she is negative for H. pylori, then additional testing including ultrasound imaging as well as an endoscopy will need to be considered.  We had previously considered a colonoscopy to be done at the same time due to symptoms.  She is not having those symptoms at this time.  However, she does have a significant IDA and there is a general recommendation to consider a diagnostic colonoscopy in these individuals, though with her menorrhagia this is the most  likely etiology for her symptoms of IDA.  Thus for now, we are holding on colonoscopy unless something else changes or our hematology colleagues feel we need to rule that out.  All patient questions were answered to the best of my ability, and the patient agrees to the aforementioned plan of action with follow-up as indicated.   PLAN  Laboratories as outlined below H. pylori stool antigen to ensure eradication has been ordered After H. pylori stool antigen has been given then may initiate Nexium 40 mg daily Zofran 4 mg ODT as needed every 8 hours Abdominal ultrasound to be obtained May consider diagnostic endoscopy pending how the patient does in the coming weeks Colonoscopy can be considered in the setting of her significant IDA though this is most likely a result of her menorrhagia  No orders of the defined types were placed in this encounter.   New Prescriptions   No medications on file   Modified Medications  No medications on file    Planned Follow Up No follow-ups on file.   Total Time in Face-to-Face and in Coordination of Care for patient including independent/personal interpretation/review of prior testing, medical history, examination, medication adjustment, communicating results with the patient directly, and documentation with the EHR is 25 minutes.   Justice Britain, MD [facility] Office # [phone]

## 2021-09-27 NOTE — Patient Instructions (Signed)
If in the future if you have increased acid reflux you may use Pepcid. If Pepcid does not help, let is know and we can send prescription for Protonix or Nexium.   Follow-up as needed.   If you are age 30 or older, your body mass index should be between 23-30. Your Body mass index is 32.58 kg/m. If this is out of the aforementioned range listed, please consider follow up with your Primary Care Provider.  If you are age 36 or younger, your body mass index should be between 19-25. Your Body mass index is 32.58 kg/m. If this is out of the aformentioned range listed, please consider follow up with your Primary Care Provider.   ________________________________________________________  The [facility] providers would like to encourage you to use [facility] to communicate with providers for non-urgent requests or questions.  Due to long hold times on the telephone, sending your provider a message by [facility] may be a faster and more efficient way to get a response.  Please allow 48 business hours for a response.  Please remember that this is for non-urgent requests.  _______________________________________________________  Thank you for choosing me and [facility].  Dr. Rush Landmark

## 2021-09-28 LAB — D-DIMER, QUANTITATIVE: D-Dimer, Quant: 2.34 mcg/mL FEU — ABNORMAL HIGH (ref <0.50)

## 2021-09-29 ENCOUNTER — Encounter: Payer: Self-pay | Admitting: Gastroenterology

## 2021-09-29 ENCOUNTER — Encounter: Payer: Self-pay | Admitting: Hematology and Oncology

## 2021-09-29 DIAGNOSIS — Z8619 Personal history of other infectious and parasitic diseases: Secondary | ICD-10-CM | POA: Insufficient documentation

## 2021-09-29 NOTE — Progress Notes (Signed)
[place] [phone]   [phone]  PROGRESS NOTE  Patient Care Team: Maximiano Coss, NP as PCP - General (Adult Health Nurse Practitioner) Patient, [name] (Inactive) (General Practice)  Hematological/Oncological History # Microcytosis  1) 08/08/2014: WBC 6.5, Hgb 12.1, MCV 69.4, Plt 167 2) 07/26/2016: WBC 5.0, Hgb 10.4, MCV 64, Plt 235 3) 12/30/2016: WBC 5.8, Hgb 11.3, MCV 71, Plt 165 4) 05/13/2017: WBC 5.7, Hgb 12.0, MCV 71.5, Plt 122 5) 07/26/2019: WBC 3.2, Hgb 12.0, MCV 77 6) 09/22/2019: WBC 3.8, Hgb 12.3, MCV 73, Plt 170. Iron 70, Sat 15%, Ferritin 8.9.  7) 11/14/2019: establish care with Dr. Lorenso Courier  8) 03/31/2020-05/11/2020: IV iron sucrose 200 mg x 5 doses 9) 06/07/2021: WBC 4.5, Hgb 11.9, MCV 74.1, Plt 171  Interval History:  Lauren Potter All 31 y.o. female with medical history significant for microcytosis who presents for a follow up visit. The patient's last visit was on 06/07/2021. In the interim since the last visit she has continued PO ferrous sulfate 358m daily.  On exam today Mrs. LKenneynotes she has been good overall in the interim since her last visit.  She reports her energy has been "so-so".  She notes that it is about a 6 out of 10.  She notes that she is been continuing to take her iron pills every single day.  She does not have any stomach upset as a result of these pills.  She reports that sometimes her stools are little dark but otherwise the color is normal.  Over the past month she has not had many menstrual cycles as unfortunately she had a miscarriage.  She reports that her prior menstrual cycle was quite light.  She notes that she does not eat red meat and loves eating steak about 2 times per week.  She also enjoys eating liver.  Otherwise she is had no other overt signs of bleeding such as nosebleeds, or dark stools.  She currently denies having issues with fevers, chills, sweats, nausea, or diarrhea.  A full 10 point ROS is listed  below.  MEDICAL HISTORY:  Past Medical History:  Diagnosis Date   Frequent sinus infections    Iron deficiency anemia    Medical history non-contributory     SURGICAL HISTORY: Past Surgical History:  Procedure Laterality Date   APPENDECTOMY      SOCIAL HISTORY: Social History   Socioeconomic History   Marital status: Married    Spouse name: Not on file   Number of children: Not on file   Years of education: Not on file   Highest education level: Not on file  Occupational History   Not on file  Tobacco Use   Smoking status: Never   Smokeless tobacco: Never  Vaping Use   Vaping Use: Never used  Substance and Sexual Activity   Alcohol use: No   Drug use: No   Sexual activity: Yes    Partners: Male    Birth control/protection: I.U.D.  Other Topics Concern   Not on file  Social History Narrative   Not on file   Social Determinants of Health   Financial Resource Strain: Not on file  Food Insecurity: Not on file  Transportation Needs: Not on file  Physical Activity: Not on file  Stress: Not on file  Social Connections: Not on file  Intimate Partner Violence: Not on file    FAMILY HISTORY: Family History  Problem Relation Age of Onset   Diabetes Mother  Hypertension Father    Diabetes Maternal Grandmother    Diabetes Maternal Grandfather    Hypertension Paternal Grandmother    Colon cancer Neg Hx    Esophageal cancer Neg Hx    Inflammatory bowel disease Neg Hx    Liver disease Neg Hx    Pancreatic cancer Neg Hx    Rectal cancer Neg Hx    Stomach cancer Neg Hx     ALLERGIES:  is allergic to dust mite extract, other, and shellfish allergy.  MEDICATIONS:  Current Outpatient Medications  Medication Sig Dispense Refill   ondansetron (ZOFRAN-ODT) 4 MG disintegrating tablet Take 1 tablet (4 mg total) by mouth every 6 (six) hours as needed for nausea or vomiting. 30 tablet 3   No current facility-administered medications for this visit.    REVIEW OF  SYSTEMS:   Constitutional: ( - ) fevers, ( - )  chills , ( - ) night sweats Eyes: ( - ) blurriness of vision, ( - ) double vision, ( - ) watery eyes Ears, nose, mouth, throat, and face: ( - ) mucositis, ( - ) sore throat Respiratory: ( - ) cough, ( - ) dyspnea, ( - ) wheezes Cardiovascular: ( - ) palpitation, ( - ) chest discomfort, ( - ) lower extremity swelling Gastrointestinal:  ( - ) nausea, ( - ) heartburn, ( - ) change in bowel habits Skin: ( - ) abnormal skin rashes Lymphatics: ( - ) new lymphadenopathy, ( - ) easy bruising Neurological: ( - ) numbness, ( - ) tingling, ( - ) new weaknesses Behavioral/Psych: ( - ) mood change, ( - ) new changes  All other systems were reviewed with the patient and are negative.  PHYSICAL EXAMINATION:  Vitals:   09/12/21 1516  BP: 121/65  Pulse: 71  Resp: 18  Temp: 97.6 F (36.4 C)  SpO2: 100%   Filed Weights   09/12/21 1516  Weight: 203 lb 3.2 oz (92.2 kg)    GENERAL: well appearing young African American female in NAD  SKIN: skin color, texture, turgor are normal, no rashes or significant lesions EYES: conjunctiva are pink and non-injected, sclera clear LUNGS: clear to auscultation and percussion with normal breathing effort HEART: regular rate & rhythm and no murmurs and no lower extremity edema Musculoskeletal: no cyanosis of digits and no clubbing  PSYCH: alert & oriented x 3, fluent speech NEURO: no focal motor/sensory deficits  LABORATORY DATA:  I have reviewed the data as listed    Latest Ref Rng & Units 09/27/2021    2:38 PM 09/12/2021    2:40 PM 06/07/2021    1:47 PM  CBC  WBC 4.0 - 10.5 K/uL 4.8   4.0   4.5    Hemoglobin 12.0 - 15.0 g/dL 12.0   11.9   11.9    Hematocrit 36.0 - 46.0 % 37.0   36.3   38.0    Platelets 150.0 - 400.0 K/uL 155.0   151   171         Latest Ref Rng & Units 09/27/2021    2:38 PM 09/12/2021    2:40 PM 06/07/2021    1:47 PM  CMP  Glucose 70 - 99 mg/dL 88   98   88    BUN 6 - 23 mg/dL '11   13    8    ' Creatinine 0.40 - 1.20 mg/dL 0.70   0.69   0.68    Sodium 135 - 145 mEq/L 137   135  138    Potassium 3.5 - 5.1 mEq/L 3.9   3.8   4.0    Chloride 96 - 112 mEq/L 102   104   103    CO2 19 - 32 mEq/L '27   26   27    ' Calcium 8.4 - 10.5 mg/dL 9.8   9.5   10.0    Total Protein 6.0 - 8.3 g/dL 7.2   7.7   8.0    Total Bilirubin 0.2 - 1.2 mg/dL 0.3   0.3   0.5    Alkaline Phos 39 - 117 U/L '22   24   28    ' AST 0 - 37 U/L '12   11   15    ' ALT 0 - 35 U/L '15   11   19     ' RADIOGRAPHIC STUDIES: No results found.  ASSESSMENT & PLAN Lauren Potter 31 y.o. female with medical history significant for microcytosis who presents for a follow up visit.    After review the labs, the records, discussion with the patient the findings most consistent with a mild iron deficiency anemia and microcytosis.  The patient has been taking p.o. iron therapy without much of a boost in her energy level.   Overall I recommend that we continue with the p.o. iron therapy and if no improvement is seen in her counts with a recheck in 3 month's time we consider ministration of IV iron.   In the event this is ineffective we would need to consider bone marrow biopsy, though pulmonary dysfunction causing microcytosis is relatively uncommon finding.  # Microcytosis # History of Iron Deficiency   -- long history of microcytosis but Hgb cascade showed no evidence of  thalassemia or other hemoglobinopathy causing this finding. -- Labs from 09/12/2021 show ferritin of 23 with hemoglobin of 11.9 --continue PO iron ferrous sulfate 378m daily (or every other day if patient is not tolerating iron) with a source of vitamin C --the use of IV iron could be considered if patient has the inability to tolerate PO iron therapy or it fails to maintain her Hgb  --RTC in 3 months to re-evaluate or sooner if further workup is required.   No orders of the defined types were placed in this encounter.   All questions were answered. The patient  knows to call the clinic with any problems, questions or concerns.  A total of more than 30 minutes were spent on this encounter and over half of that time was spent on counseling and coordination of care as outlined above.   JLedell Peoples MD Department of Hematology/Oncology [place] CenterLLCPhone: [phone]: [phone]: [email or web address]  09/29/2021 4:42 PM

## 2021-10-19 ENCOUNTER — Ambulatory Visit (HOSPITAL_BASED_OUTPATIENT_CLINIC_OR_DEPARTMENT_OTHER): Payer: 59

## 2021-10-29 DIAGNOSIS — Z8759 Personal history of other complications of pregnancy, childbirth and the puerperium: Secondary | ICD-10-CM | POA: Diagnosis not present

## 2021-10-31 DIAGNOSIS — O3680X9 Pregnancy with inconclusive fetal viability, other fetus: Secondary | ICD-10-CM | POA: Diagnosis not present

## 2021-11-13 DIAGNOSIS — R102 Pelvic and perineal pain: Secondary | ICD-10-CM | POA: Diagnosis not present

## 2021-11-13 DIAGNOSIS — R69 Illness, unspecified: Secondary | ICD-10-CM | POA: Diagnosis not present

## 2021-11-13 DIAGNOSIS — Z113 Encounter for screening for infections with a predominantly sexual mode of transmission: Secondary | ICD-10-CM | POA: Diagnosis not present

## 2021-11-13 DIAGNOSIS — N925 Other specified irregular menstruation: Secondary | ICD-10-CM | POA: Diagnosis not present

## 2021-11-14 LAB — OB RESULTS CONSOLE GC/CHLAMYDIA
Chlamydia: NEGATIVE
Neisseria Gonorrhea: NEGATIVE

## 2021-11-26 DIAGNOSIS — F32A Depression, unspecified: Secondary | ICD-10-CM | POA: Insufficient documentation

## 2021-11-26 DIAGNOSIS — D649 Anemia, unspecified: Secondary | ICD-10-CM | POA: Insufficient documentation

## 2021-11-26 DIAGNOSIS — B009 Herpesviral infection, unspecified: Secondary | ICD-10-CM | POA: Insufficient documentation

## 2021-11-28 DIAGNOSIS — Z369 Encounter for antenatal screening, unspecified: Secondary | ICD-10-CM | POA: Diagnosis not present

## 2021-11-28 DIAGNOSIS — R69 Illness, unspecified: Secondary | ICD-10-CM | POA: Diagnosis not present

## 2021-11-28 DIAGNOSIS — Z01411 Encounter for gynecological examination (general) (routine) with abnormal findings: Secondary | ICD-10-CM | POA: Diagnosis not present

## 2021-11-28 DIAGNOSIS — Z01419 Encounter for gynecological examination (general) (routine) without abnormal findings: Secondary | ICD-10-CM | POA: Diagnosis not present

## 2021-11-28 DIAGNOSIS — Z124 Encounter for screening for malignant neoplasm of cervix: Secondary | ICD-10-CM | POA: Diagnosis not present

## 2021-11-28 DIAGNOSIS — O26899 Other specified pregnancy related conditions, unspecified trimester: Secondary | ICD-10-CM | POA: Diagnosis not present

## 2021-11-28 LAB — OB RESULTS CONSOLE ABO/RH: RH Type: POSITIVE

## 2021-11-28 LAB — HEPATITIS C ANTIBODY: HCV Ab: NEGATIVE

## 2021-11-28 LAB — OB RESULTS CONSOLE HEPATITIS B SURFACE ANTIGEN: Hepatitis B Surface Ag: NEGATIVE

## 2021-11-28 LAB — OB RESULTS CONSOLE HIV ANTIBODY (ROUTINE TESTING): HIV: NONREACTIVE

## 2021-11-28 LAB — OB RESULTS CONSOLE ANTIBODY SCREEN: Antibody Screen: NEGATIVE

## 2021-11-28 LAB — OB RESULTS CONSOLE RUBELLA ANTIBODY, IGM: Rubella: IMMUNE

## 2021-11-28 LAB — OB RESULTS CONSOLE RPR: RPR: NONREACTIVE

## 2021-12-02 DIAGNOSIS — E559 Vitamin D deficiency, unspecified: Secondary | ICD-10-CM | POA: Insufficient documentation

## 2021-12-13 ENCOUNTER — Inpatient Hospital Stay: Payer: 59 | Attending: Hematology and Oncology | Admitting: Hematology and Oncology

## 2021-12-13 ENCOUNTER — Inpatient Hospital Stay: Payer: 59

## 2021-12-13 ENCOUNTER — Other Ambulatory Visit: Payer: Self-pay | Admitting: Hematology and Oncology

## 2021-12-13 DIAGNOSIS — D500 Iron deficiency anemia secondary to blood loss (chronic): Secondary | ICD-10-CM

## 2022-01-03 DIAGNOSIS — Z3A20 20 weeks gestation of pregnancy: Secondary | ICD-10-CM | POA: Diagnosis not present

## 2022-01-03 DIAGNOSIS — Z363 Encounter for antenatal screening for malformations: Secondary | ICD-10-CM | POA: Diagnosis not present

## 2022-01-20 DIAGNOSIS — Z3483 Encounter for supervision of other normal pregnancy, third trimester: Secondary | ICD-10-CM | POA: Diagnosis not present

## 2022-01-20 DIAGNOSIS — Z3482 Encounter for supervision of other normal pregnancy, second trimester: Secondary | ICD-10-CM | POA: Diagnosis not present

## 2022-02-27 DIAGNOSIS — Z369 Encounter for antenatal screening, unspecified: Secondary | ICD-10-CM | POA: Diagnosis not present

## 2022-02-27 DIAGNOSIS — Z3A28 28 weeks gestation of pregnancy: Secondary | ICD-10-CM | POA: Diagnosis not present

## 2022-02-28 DIAGNOSIS — D6949 Other primary thrombocytopenia: Secondary | ICD-10-CM | POA: Insufficient documentation

## 2022-03-03 DIAGNOSIS — M5489 Other dorsalgia: Secondary | ICD-10-CM | POA: Diagnosis not present

## 2022-03-31 DIAGNOSIS — K5909 Other constipation: Secondary | ICD-10-CM | POA: Diagnosis not present

## 2022-03-31 DIAGNOSIS — D6949 Other primary thrombocytopenia: Secondary | ICD-10-CM | POA: Diagnosis not present

## 2022-03-31 DIAGNOSIS — O368199 Decreased fetal movements, unspecified trimester, other fetus: Secondary | ICD-10-CM | POA: Diagnosis not present

## 2022-03-31 DIAGNOSIS — R69 Illness, unspecified: Secondary | ICD-10-CM | POA: Diagnosis not present

## 2022-03-31 DIAGNOSIS — E559 Vitamin D deficiency, unspecified: Secondary | ICD-10-CM | POA: Diagnosis not present

## 2022-03-31 DIAGNOSIS — O3662X0 Maternal care for excessive fetal growth, second trimester, not applicable or unspecified: Secondary | ICD-10-CM | POA: Diagnosis not present

## 2022-03-31 DIAGNOSIS — O3663X0 Maternal care for excessive fetal growth, third trimester, not applicable or unspecified: Secondary | ICD-10-CM | POA: Diagnosis not present

## 2022-03-31 DIAGNOSIS — Z8759 Personal history of other complications of pregnancy, childbirth and the puerperium: Secondary | ICD-10-CM | POA: Diagnosis not present

## 2022-03-31 DIAGNOSIS — D649 Anemia, unspecified: Secondary | ICD-10-CM | POA: Diagnosis not present

## 2022-03-31 DIAGNOSIS — Z3A32 32 weeks gestation of pregnancy: Secondary | ICD-10-CM | POA: Diagnosis not present

## 2022-04-18 IMAGING — DX DG LUMBAR SPINE COMPLETE 4+V
5 series · 5 of 5 positions shown · non-contrast
Comparison: 6074

CLINICAL DATA: Low back pain

EXAM:
LUMBAR SPINE - COMPLETE 4+ VIEW

[t lumbar spine ap]
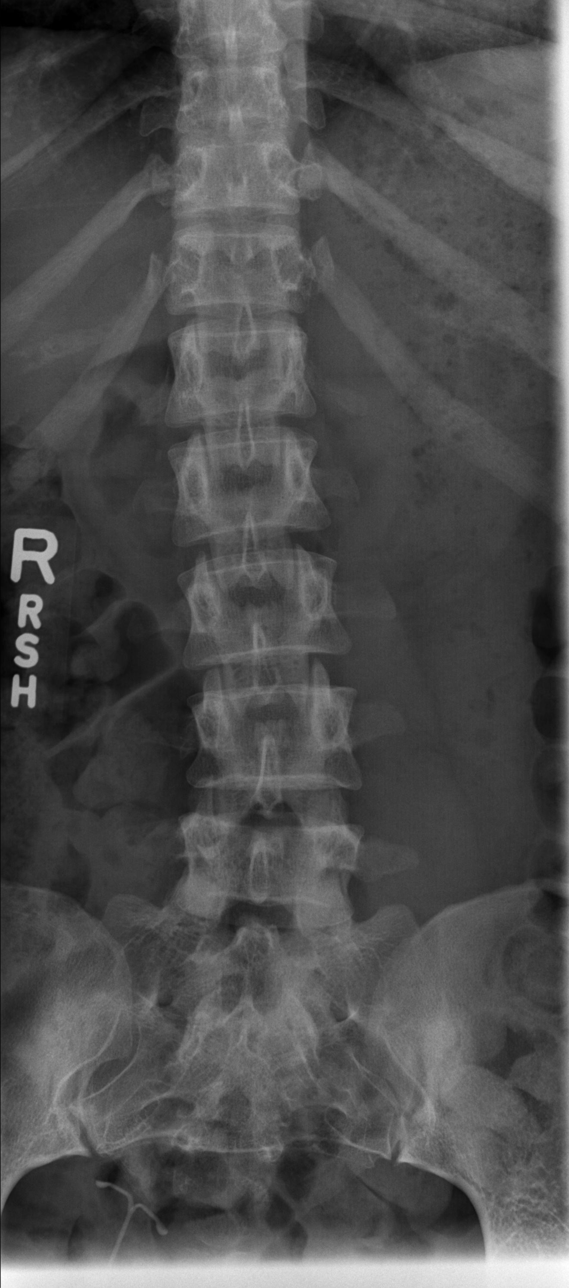

[t lumbar spine obl (1 of 2)]
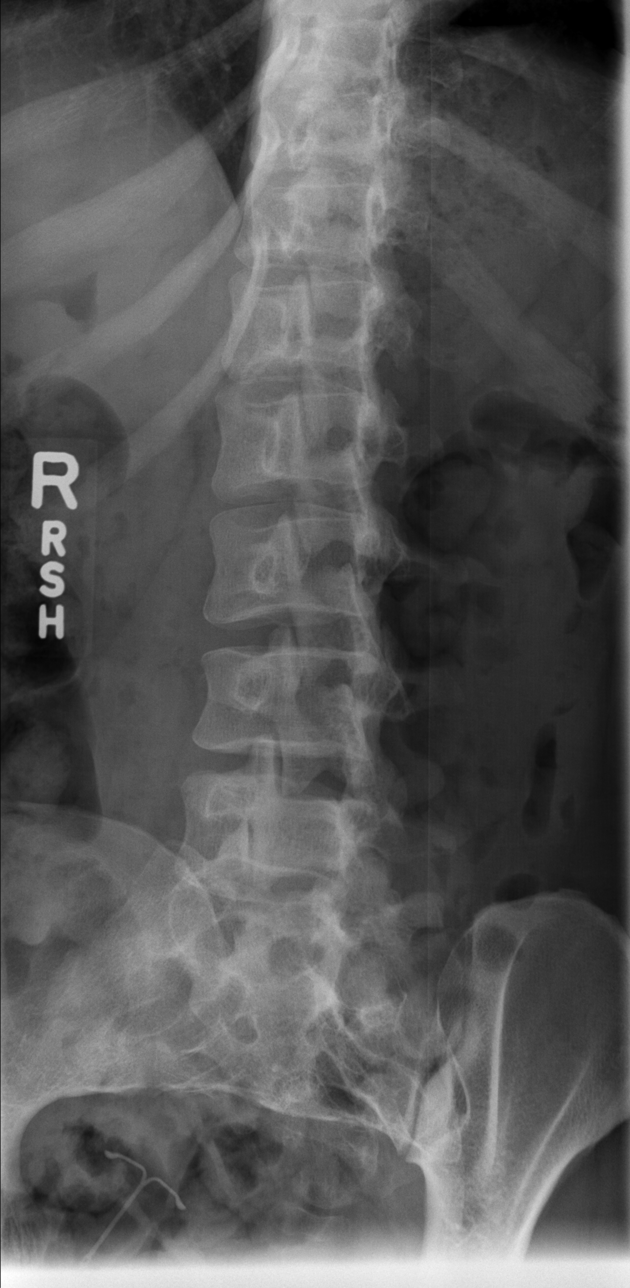

[t lumbar spine obl (2 of 2)]
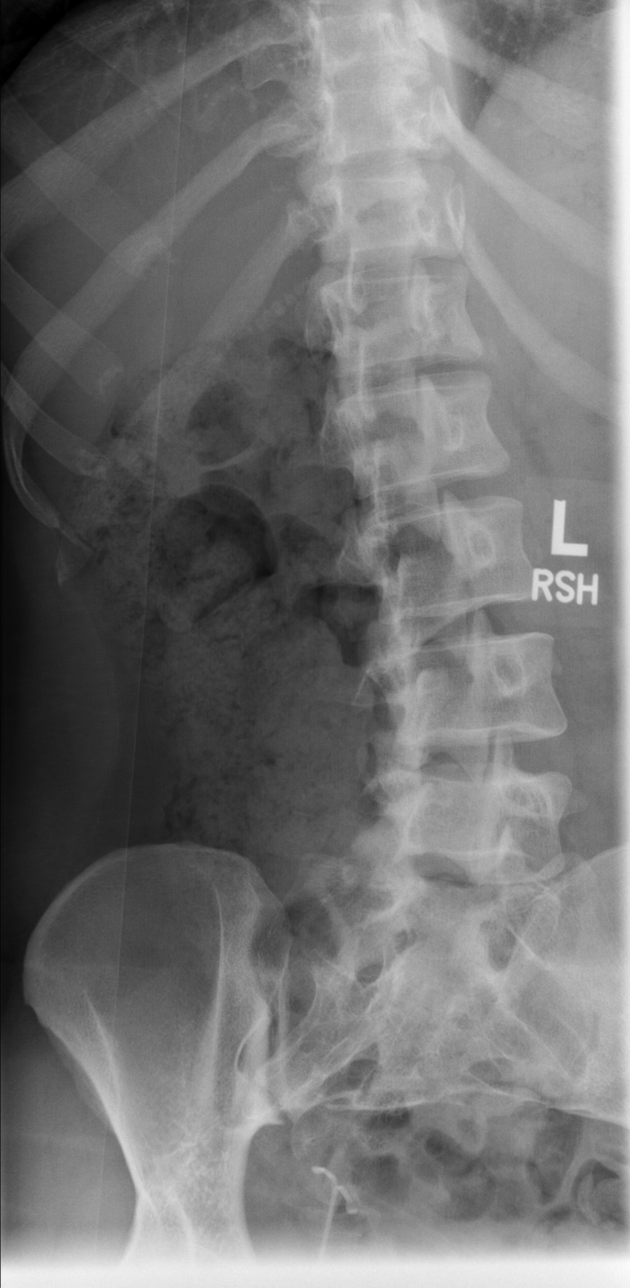

[t lumbar spine lat]
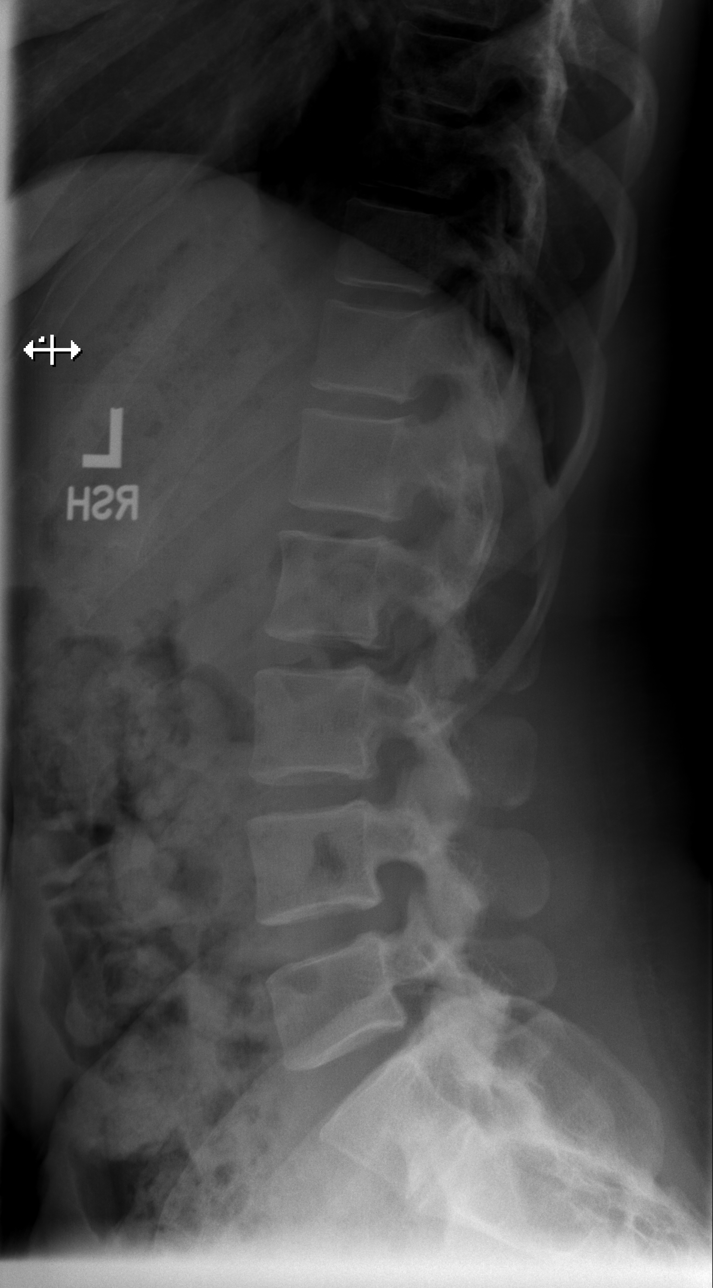

[t lumbar l-5 s-1 spot]
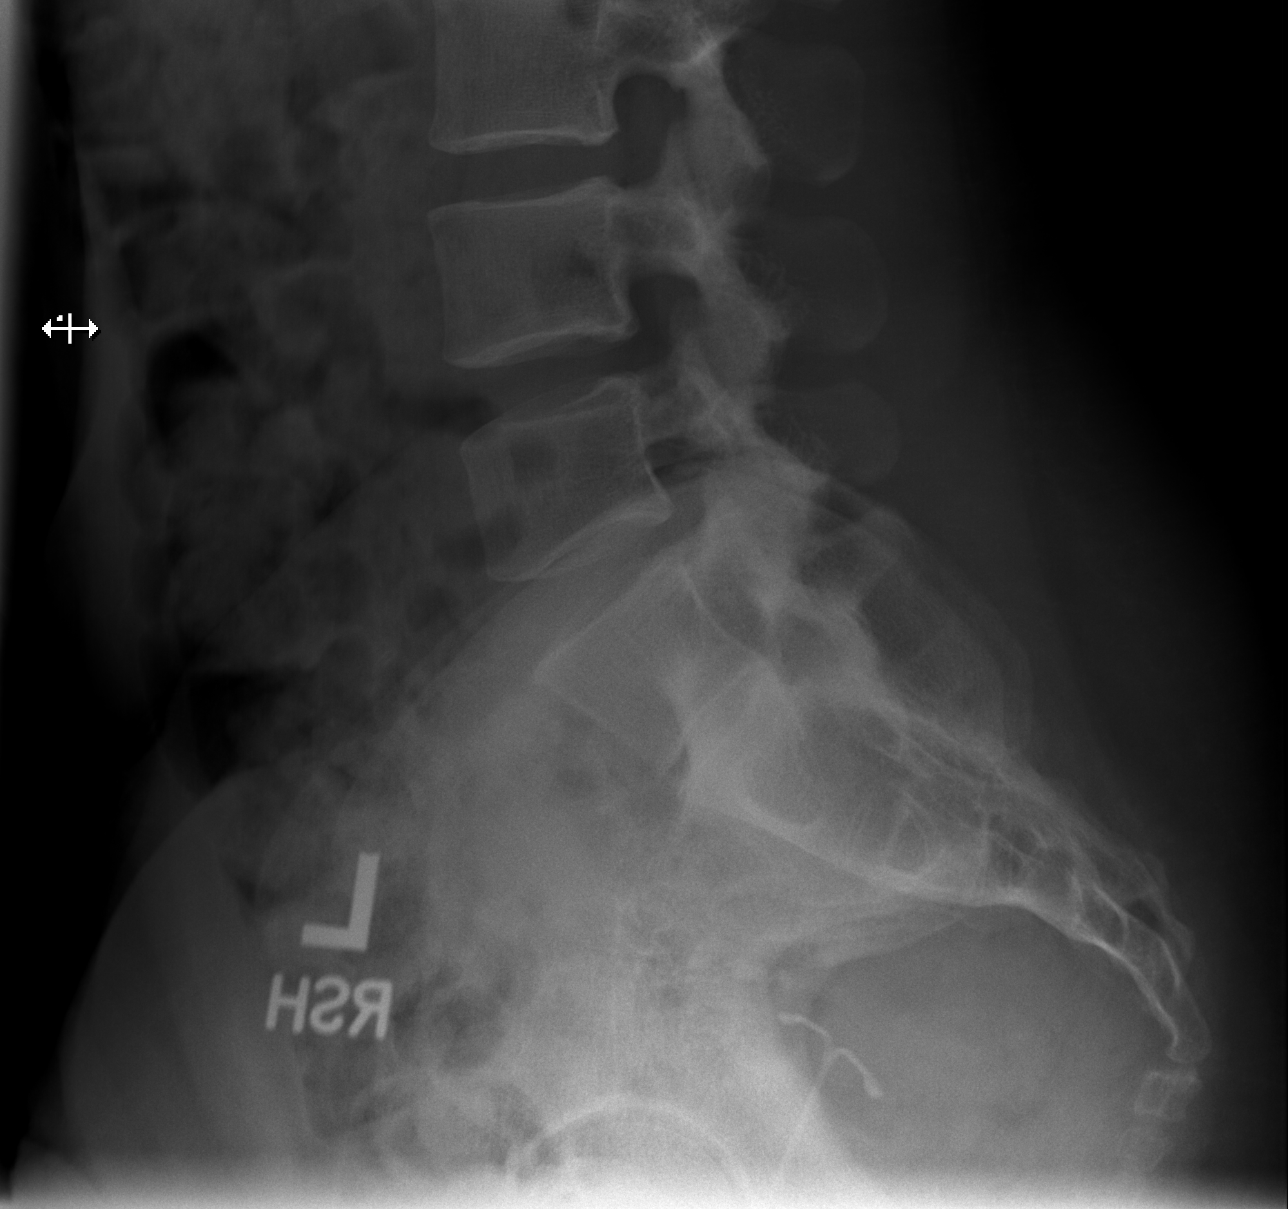

[5 of 5 positions shown; findings below may reference images not displayed]

FINDINGS: Slight levocurvature. No pars break. Anteroposterior alignment is
maintained. Vertebral body heights are preserved. There is no
significant intervertebral disc space narrowing or facet
hypertrophy. Intrauterine device is present.
IMPRESSION: No significant abnormality or change since [DATE].

## 2022-04-29 DIAGNOSIS — Z3A36 36 weeks gestation of pregnancy: Secondary | ICD-10-CM | POA: Diagnosis not present

## 2022-04-29 DIAGNOSIS — Z369 Encounter for antenatal screening, unspecified: Secondary | ICD-10-CM | POA: Diagnosis not present

## 2022-04-29 DIAGNOSIS — O3663X0 Maternal care for excessive fetal growth, third trimester, not applicable or unspecified: Secondary | ICD-10-CM | POA: Diagnosis not present

## 2022-04-29 LAB — OB RESULTS CONSOLE GBS: GBS: NEGATIVE

## 2022-05-05 NOTE — L&D Delivery Note (Signed)
Delivery Note Arrived to room to assess patient due to variable deceleration to the 60s. FSE in place and pitocin turned off. Patient sitting up to encourage fetal descent. Patient was found to be complete and +1 station. Bed was broken down and pushing initiated. Fetal head was delivered over intact perineum. Nuchal x 1  was present - unable to reduce at perineum. Shoulders and body followed without difficulty. Infant was placed on mother's chest, mouth and nose were bulb suctioned and let out a vigorous cry. Delayed cord clamping was performed for 60 seconds. Venous cord blood was collected. Placenta delivered spontaneously. Cervix, vagina, perineum and labia were inspected, no tears were found. Uterus fundus firm. TXA 1g was given as prophylaxis. Placenta was inspected, found to be intact with a 3 vessel cord. Counts were correct.   At 8:04 PM a viable and healthy female was delivered via Vaginal, Spontaneous (Presentation: Left Occiput Anterior).  APGAR: 8,9 ; weight: pending Placenta status: Spontaneous, Intact.  Cord: 3 vessels with the following complications: None.  Cord pH: Not collected  Anesthesia: Epidural Episiotomy: None Lacerations:  None Suture Repair:  N/A Est. Blood Loss (mL): 301  Mom to postpartum.  Baby to Couplet care / Skin to Skin.  Drema Dallas 05/21/2022, 8:18 PM

## 2022-05-14 ENCOUNTER — Encounter (HOSPITAL_COMMUNITY): Payer: Self-pay | Admitting: *Deleted

## 2022-05-14 ENCOUNTER — Telehealth (HOSPITAL_COMMUNITY): Payer: Self-pay | Admitting: *Deleted

## 2022-05-14 NOTE — Telephone Encounter (Signed)
Preadmission screen  

## 2022-05-21 ENCOUNTER — Other Ambulatory Visit: Payer: Self-pay

## 2022-05-21 ENCOUNTER — Inpatient Hospital Stay (HOSPITAL_COMMUNITY)
Admission: RE | Admit: 2022-05-21 | Discharge: 2022-05-23 | DRG: 806 | Disposition: A | Discharge status: Home / Self Care | Payer: 59 | Attending: Obstetrics and Gynecology | Admitting: Obstetrics and Gynecology

## 2022-05-21 ENCOUNTER — Inpatient Hospital Stay (HOSPITAL_COMMUNITY): Payer: 59 | Admitting: Anesthesiology

## 2022-05-21 ENCOUNTER — Encounter (HOSPITAL_COMMUNITY): Payer: Self-pay | Admitting: Obstetrics and Gynecology

## 2022-05-21 ENCOUNTER — Inpatient Hospital Stay (HOSPITAL_COMMUNITY): Payer: 59

## 2022-05-21 ENCOUNTER — Encounter: Payer: Self-pay | Admitting: Hematology and Oncology

## 2022-05-21 DIAGNOSIS — O134 Gestational [pregnancy-induced] hypertension without significant proteinuria, complicating childbirth: Secondary | ICD-10-CM | POA: Diagnosis not present

## 2022-05-21 DIAGNOSIS — O3663X0 Maternal care for excessive fetal growth, third trimester, not applicable or unspecified: Principal | ICD-10-CM | POA: Diagnosis present

## 2022-05-21 DIAGNOSIS — E876 Hypokalemia: Secondary | ICD-10-CM | POA: Diagnosis not present

## 2022-05-21 DIAGNOSIS — O480 Post-term pregnancy: Secondary | ICD-10-CM | POA: Diagnosis not present

## 2022-05-21 DIAGNOSIS — Z3A40 40 weeks gestation of pregnancy: Secondary | ICD-10-CM

## 2022-05-21 DIAGNOSIS — Z3493 Encounter for supervision of normal pregnancy, unspecified, third trimester: Secondary | ICD-10-CM | POA: Diagnosis not present

## 2022-05-21 DIAGNOSIS — Z8619 Personal history of other infectious and parasitic diseases: Secondary | ICD-10-CM | POA: Diagnosis not present

## 2022-05-21 DIAGNOSIS — A6000 Herpesviral infection of urogenital system, unspecified: Secondary | ICD-10-CM | POA: Diagnosis present

## 2022-05-21 DIAGNOSIS — Z3490 Encounter for supervision of normal pregnancy, unspecified, unspecified trimester: Secondary | ICD-10-CM

## 2022-05-21 DIAGNOSIS — O9832 Other infections with a predominantly sexual mode of transmission complicating childbirth: Secondary | ICD-10-CM | POA: Diagnosis present

## 2022-05-21 DIAGNOSIS — R69 Illness, unspecified: Secondary | ICD-10-CM | POA: Diagnosis not present

## 2022-05-21 DIAGNOSIS — O2293 Venous complication in pregnancy, unspecified, third trimester: Secondary | ICD-10-CM | POA: Diagnosis not present

## 2022-05-21 DIAGNOSIS — O133 Gestational [pregnancy-induced] hypertension without significant proteinuria, third trimester: Secondary | ICD-10-CM | POA: Insufficient documentation

## 2022-05-21 DIAGNOSIS — O99284 Endocrine, nutritional and metabolic diseases complicating childbirth: Secondary | ICD-10-CM | POA: Diagnosis not present

## 2022-05-21 LAB — CBC
HCT: 35.3 % — ABNORMAL LOW (ref 36.0–46.0)
HCT: 36.5 % (ref 36.0–46.0)
Hemoglobin: 11.8 g/dL — ABNORMAL LOW (ref 12.0–15.0)
Hemoglobin: 11.5 g/dL — ABNORMAL LOW (ref 12.0–15.0)
MCH: 24.4 pg — ABNORMAL LOW (ref 26.0–34.0)
MCH: 23.5 pg — ABNORMAL LOW (ref 26.0–34.0)
MCHC: 33.4 g/dL (ref 30.0–36.0)
MCHC: 31.5 g/dL (ref 30.0–36.0)
MCV: 73.1 fL — ABNORMAL LOW (ref 80.0–100.0)
MCV: 74.6 fL — ABNORMAL LOW (ref 80.0–100.0)
Platelets: 122 10*3/uL — ABNORMAL LOW (ref 150–400)
Platelets: 118 10*3/uL — ABNORMAL LOW (ref 150–400)
RBC: 4.83 MIL/uL (ref 3.87–5.11)
RBC: 4.89 MIL/uL (ref 3.87–5.11)
RDW: 14.8 % (ref 11.5–15.5)
RDW: 14.9 % (ref 11.5–15.5)
WBC: 4.8 10*3/uL (ref 4.0–10.5)
WBC: 9.2 10*3/uL (ref 4.0–10.5)
nRBC: 0 % (ref 0.0–0.2)
nRBC: 0 % (ref 0.0–0.2)

## 2022-05-21 LAB — TYPE AND SCREEN
ABO/RH(D): B POS
Antibody Screen: NEGATIVE

## 2022-05-21 LAB — PROTEIN / CREATININE RATIO, URINE
Creatinine, Urine: 56 mg/dL
Protein Creatinine Ratio: 0.18 mg/mg{Cre} — ABNORMAL HIGH (ref 0.00–0.15)
Total Protein, Urine: 10 mg/dL

## 2022-05-21 LAB — RPR: RPR Ser Ql: NONREACTIVE

## 2022-05-21 MED ORDER — OXYTOCIN BOLUS FROM INFUSION
333.0000 mL | Freq: Once | INTRAVENOUS | Status: AC
  Administered 2022-05-21: 333 mL via INTRAVENOUS

## 2022-05-21 MED ORDER — ONDANSETRON HCL 4 MG PO TABS: 4.0000 mg | ORAL_TABLET | ORAL | Status: DC | PRN

## 2022-05-21 MED ORDER — OXYCODONE-ACETAMINOPHEN 5-325 MG PO TABS: 1.0000 | ORAL_TABLET | ORAL | Status: DC | PRN

## 2022-05-21 MED ORDER — PRENATAL MULTIVITAMIN CH
1.0000 | ORAL_TABLET | Freq: Every day | ORAL | Status: DC
  Administered 2022-05-22 – 2022-05-23 (×2): 1 via ORAL
  Filled 2022-05-21 (×2): qty 1

## 2022-05-21 MED ORDER — FLEET ENEMA 7-19 GM/118ML RE ENEM: 1.0000 | ENEMA | RECTAL | Status: DC | PRN

## 2022-05-21 MED ORDER — FENTANYL CITRATE (PF) 100 MCG/2ML IJ SOLN: 50.0000 ug | INTRAMUSCULAR | Status: DC | PRN

## 2022-05-21 MED ORDER — ONDANSETRON HCL 4 MG/2ML IJ SOLN: 4.0000 mg | INTRAMUSCULAR | Status: DC | PRN

## 2022-05-21 MED ORDER — FENTANYL-BUPIVACAINE-NACL 0.5-0.125-0.9 MG/250ML-% EP SOLN
12.0000 mL/h | EPIDURAL | Status: DC | PRN
  Administered 2022-05-21: 12 mL/h via EPIDURAL
  Filled 2022-05-21: qty 250

## 2022-05-21 MED ORDER — PHENYLEPHRINE 80 MCG/ML (10ML) SYRINGE FOR IV PUSH (FOR BLOOD PRESSURE SUPPORT): 80.0000 ug | PREFILLED_SYRINGE | INTRAVENOUS | Status: DC | PRN

## 2022-05-21 MED ORDER — LIDOCAINE HCL (PF) 1 % IJ SOLN
INTRAMUSCULAR | Status: DC | PRN
  Administered 2022-05-21: 5 mL via EPIDURAL
  Administered 2022-05-21: 3 mL via EPIDURAL

## 2022-05-21 MED ORDER — IBUPROFEN 600 MG PO TABS
600.0000 mg | ORAL_TABLET | Freq: Four times a day (QID) | ORAL | Status: DC
  Administered 2022-05-22 – 2022-05-23 (×5): 600 mg via ORAL
  Filled 2022-05-21 (×7): qty 1

## 2022-05-21 MED ORDER — SIMETHICONE 80 MG PO CHEW: 80.0000 mg | CHEWABLE_TABLET | ORAL | Status: DC | PRN

## 2022-05-21 MED ORDER — MISOPROSTOL 25 MCG QUARTER TABLET: 25.0000 ug | ORAL_TABLET | ORAL | Status: DC | PRN

## 2022-05-21 MED ORDER — SOD CITRATE-CITRIC ACID 500-334 MG/5ML PO SOLN: 30.0000 mL | ORAL | Status: DC | PRN

## 2022-05-21 MED ORDER — TETANUS-DIPHTH-ACELL PERTUSSIS 5-2.5-18.5 LF-MCG/0.5 IM SUSY: 0.5000 mL | PREFILLED_SYRINGE | Freq: Once | INTRAMUSCULAR | Status: DC

## 2022-05-21 MED ORDER — EPHEDRINE 5 MG/ML INJ: 10.0000 mg | INTRAVENOUS | Status: DC | PRN

## 2022-05-21 MED ORDER — LACTATED RINGERS IV SOLN: 500.0000 mL | Freq: Once | INTRAVENOUS | Status: DC

## 2022-05-21 MED ORDER — ACETAMINOPHEN 325 MG PO TABS
650.0000 mg | ORAL_TABLET | ORAL | Status: DC | PRN
  Administered 2022-05-22 – 2022-05-23 (×3): 650 mg via ORAL
  Filled 2022-05-21 (×3): qty 2

## 2022-05-21 MED ORDER — DIPHENHYDRAMINE HCL 50 MG/ML IJ SOLN: 12.5000 mg | INTRAMUSCULAR | Status: DC | PRN

## 2022-05-21 MED ORDER — BENZOCAINE-MENTHOL 20-0.5 % EX AERO
1.0000 | INHALATION_SPRAY | CUTANEOUS | Status: DC | PRN
  Administered 2022-05-21: 1 via TOPICAL
  Filled 2022-05-21: qty 56

## 2022-05-21 MED ORDER — MISOPROSTOL 200 MCG PO TABS
1000.0000 ug | ORAL_TABLET | Freq: Once | ORAL | Status: AC
  Administered 2022-05-21: 1000 ug via RECTAL
  Filled 2022-05-21: qty 5

## 2022-05-21 MED ORDER — ONDANSETRON HCL 4 MG/2ML IJ SOLN: 4.0000 mg | Freq: Four times a day (QID) | INTRAMUSCULAR | Status: DC | PRN

## 2022-05-21 MED ORDER — WITCH HAZEL-GLYCERIN EX PADS: 1.0000 | MEDICATED_PAD | CUTANEOUS | Status: DC | PRN

## 2022-05-21 MED ORDER — SENNOSIDES-DOCUSATE SODIUM 8.6-50 MG PO TABS
2.0000 | ORAL_TABLET | Freq: Every day | ORAL | Status: DC
  Administered 2022-05-22 – 2022-05-23 (×2): 2 via ORAL
  Filled 2022-05-21 (×2): qty 2

## 2022-05-21 MED ORDER — TRANEXAMIC ACID-NACL 1000-0.7 MG/100ML-% IV SOLN
INTRAVENOUS | Status: AC
  Administered 2022-05-21: 1000 mg via INTRAVENOUS
  Filled 2022-05-21: qty 100

## 2022-05-21 MED ORDER — TERBUTALINE SULFATE 1 MG/ML IJ SOLN: 0.2500 mg | Freq: Once | INTRAMUSCULAR | Status: DC | PRN

## 2022-05-21 MED ORDER — LACTATED RINGERS IV SOLN: INTRAVENOUS | Status: DC

## 2022-05-21 MED ORDER — OXYTOCIN-SODIUM CHLORIDE 30-0.9 UT/500ML-% IV SOLN: 2.5000 [IU]/h | INTRAVENOUS | Status: DC

## 2022-05-21 MED ORDER — OXYTOCIN-SODIUM CHLORIDE 30-0.9 UT/500ML-% IV SOLN
1.0000 m[IU]/min | INTRAVENOUS | Status: DC
  Administered 2022-05-21: 1 m[IU]/min via INTRAVENOUS
  Filled 2022-05-21: qty 500

## 2022-05-21 MED ORDER — COCONUT OIL OIL: 1.0000 | TOPICAL_OIL | Status: DC | PRN

## 2022-05-21 MED ORDER — LACTATED RINGERS IV SOLN: 500.0000 mL | INTRAVENOUS | Status: DC | PRN

## 2022-05-21 MED ORDER — DIBUCAINE (PERIANAL) 1 % EX OINT: 1.0000 | TOPICAL_OINTMENT | CUTANEOUS | Status: DC | PRN

## 2022-05-21 MED ORDER — ZOLPIDEM TARTRATE 5 MG PO TABS: 5.0000 mg | ORAL_TABLET | Freq: Every evening | ORAL | Status: DC | PRN

## 2022-05-21 MED ORDER — DIPHENHYDRAMINE HCL 25 MG PO CAPS: 25.0000 mg | ORAL_CAPSULE | Freq: Four times a day (QID) | ORAL | Status: DC | PRN

## 2022-05-21 MED ORDER — OXYCODONE-ACETAMINOPHEN 5-325 MG PO TABS: 2.0000 | ORAL_TABLET | ORAL | Status: DC | PRN

## 2022-05-21 MED ORDER — LIDOCAINE HCL (PF) 1 % IJ SOLN: 30.0000 mL | INTRAMUSCULAR | Status: DC | PRN

## 2022-05-21 MED ORDER — ACETAMINOPHEN 325 MG PO TABS: 650.0000 mg | ORAL_TABLET | ORAL | Status: DC | PRN

## 2022-05-21 MED ORDER — TRANEXAMIC ACID-NACL 1000-0.7 MG/100ML-% IV SOLN: 1000.0000 mg | Freq: Once | INTRAVENOUS | Status: AC

## 2022-05-21 NOTE — Plan of Care (Signed)

## 2022-05-21 NOTE — Anesthesia Procedure Notes (Signed)
Epidural Patient location during procedure: OB Start time: 05/21/2022 5:51 PM End time: 05/21/2022 5:56 PM  Staffing Anesthesiologist: Nilda Simmer, MD Performed: anesthesiologist   Preanesthetic Checklist Completed: patient identified, IV checked, site marked, risks and benefits discussed, surgical consent, monitors and equipment checked, pre-op evaluation and timeout performed  Epidural Patient position: sitting Prep: DuraPrep and site prepped and draped Patient monitoring: continuous pulse ox and blood pressure Approach: midline Location: L3-L4 Injection technique: LOR saline  Needle:  Needle type: Tuohy  Needle gauge: 17 G Needle length: 9 cm and 9 Needle insertion depth: 6.5 cm Catheter type: closed end flexible Catheter size: 19 Gauge Catheter at skin depth: 11 cm Test dose: negative  Assessment Events: blood not aspirated, injection not painful, no injection resistance, no paresthesia and negative IV test  Additional Notes The patient has requested an epidural for labor pain management. Risks and benefits including, but not limited to, infection, bleeding, local anesthetic toxicity, headache, hypotension, back pain, block failure, etc. were discussed with the patient. The patient expressed understanding and consented to the procedure. I confirmed that the patient has no bleeding disorders and is not taking blood thinners. I confirmed the patient's last platelet count with the nurse. A time-out was performed immediately prior to the procedure. Please see nursing documentation for vital signs. Sterile technique was used throughout the whole procedure. Once LOR achieved, the epidural catheter threaded easily without resistance. Aspiration of the catheter was negative for blood and CSF. The epidural was dosed slowly and an infusion was started.  1 attempt(s)Reason for block:procedure for pain

## 2022-05-21 NOTE — Anesthesia Preprocedure Evaluation (Signed)
Anesthesia Evaluation  Patient identified by MRN, date of birth, ID band Patient awake    Reviewed: Allergy & Precautions, NPO status , Patient's Chart, lab work & pertinent test results  History of Anesthesia Complications Negative for: history of anesthetic complications  Airway Mallampati: III  TM Distance: >3 FB Neck ROM: Full    Dental   Pulmonary neg pulmonary ROS   Pulmonary exam normal breath sounds clear to auscultation       Cardiovascular negative cardio ROS  Rhythm:Regular Rate:Normal     Neuro/Psych  PSYCHIATRIC DISORDERS  Depression       GI/Hepatic Neg liver ROS,GERD  ,,  Endo/Other  negative endocrine ROS    Renal/GU negative Renal ROS     Musculoskeletal   Abdominal  (+) + obese  Peds  Hematology  (+) Blood dyscrasia (iron deficiency), anemia   Anesthesia Other Findings 32 yo [id number] at 40wks being admitted for IOL with h/o LGA fetus.  Reproductive/Obstetrics (+) Pregnancy                             Anesthesia Physical Anesthesia Plan  ASA: 2  Anesthesia Plan: Epidural   Post-op Pain Management:    Induction:   PONV Risk Score and Plan:   Airway Management Planned:   Additional Equipment:   Intra-op Plan:   Post-operative Plan:   Informed Consent: I have reviewed the patients History and Physical, chart, labs and discussed the procedure including the risks, benefits and alternatives for the proposed anesthesia with the patient or authorized representative who has indicated his/her understanding and acceptance.       Plan Discussed with: Anesthesiologist  Anesthesia Plan Comments: (I have discussed risks of neuraxial anesthesia including but not limited to infection, bleeding, nerve injury, back pain, headache, seizures, and failure of block. Patient denies bleeding disorders and is not currently anticoagulated. Labs have been reviewed. Risks and  benefits discussed. All patient's questions answered.  )       Anesthesia Quick Evaluation

## 2022-05-21 NOTE — H&P (Signed)
Lauren Potter is a 32 y.o. female presenting for IOL d/t 40wks, LGA and h/o shoulder dystocia for 14min of 9lbs 9oz baby with no deficits.  OB History     Gravida  4   Para  2   Term  2   Preterm      AB  1   Living  2      SAB      IAB      Ectopic      Multiple  0   Live Births  2          Past Medical History:  Diagnosis Date   Frequent sinus infections    Iron deficiency anemia    Medical history non-contributory    Past Surgical History:  Procedure Laterality Date   APPENDECTOMY     Family History: family history includes Diabetes in her maternal grandfather, maternal grandmother, and mother; Hypertension in her father and paternal grandmother. Social History:  reports that she has never smoked. She has never used smokeless tobacco. She reports that she does not drink alcohol and does not use drugs.     Maternal Diabetes: No Genetic Screening: Declined Maternal Ultrasounds/Referrals: Isolated EIF (echogenic intracardiac focus) Fetal Ultrasounds or other Referrals:  None Maternal Substance Abuse:  No Significant Maternal Medications:  None Significant Maternal Lab Results:  Group B Strep negative Number of Prenatal Visits:greater than 3 verified prenatal visits Other Comments:  None  Review of Systems Denies F/C/N/V/D/SOB  History Dilation: 3 Effacement (%): 60 Station: -2 Exam by:: Tommye Standard, RN Blood pressure (!) 147/83, pulse 71, temperature 98.4 F (36.9 C), temperature source Axillary, resp. rate 15, height 5\' 7"  (1.702 m), weight 113.1 kg, currently breastfeeding. Exam Physical Exam  Lungs unlabored breathing CV RRR Abdomen gravid, NT Extremities no calf tenderness  Prenatal labs: ABO, Rh: B/Positive/-- (07/27 0000) Antibody: Negative (07/27 0000) Rubella: Immune (07/27 0000) RPR: Nonreactive (07/27 0000)  HBsAg: Negative (07/27 0000)  HIV: Non-reactive (07/27 0000)  GBS: Negative/-- (12/26 0000)   04/29/22 EFW  8.3lbs  Assessment/Plan: 32 yo [id number] at 40wks being admitted for IOL with h/o LGA fetus.  Will start with pitocin.  Pain medication upon request.  Cat 1 tracing.  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  Delice Lesch 05/21/2022, 10:13 AM

## 2022-05-21 NOTE — Progress Notes (Signed)
Exam 4-5/80/-2, vtx IUPC placed FHT 140, + accels, no decels except variable when pt was laying on her back, moderate variability Toco q3-74min Titrate pitocn to MVUs of 180-200.

## 2022-05-21 NOTE — Progress Notes (Signed)
Pt noted to have lots of LOC and decel after I signed out.  I returned to the room and placed an FSE, started fluid bolus, changed position and d/c'd the pitocin.  Dr. Delora Fuel notified and in the room now.  Exam FD and 0 station.

## 2022-05-21 NOTE — Progress Notes (Signed)
Lauren Potter is a 32 y.o. [id number] at [redacted]w[redacted]d admitted for induction of labor due to 40wks and LGA.  Subjective:   Objective: BP 130/67   Pulse 69   Temp (!) 97.2 F (36.2 C) (Oral)   Resp 16   Ht 5\' 7"  (1.702 m)   Wt 113.1 kg   BMI 39.05 kg/m  No intake/output data recorded. No intake/output data recorded.  FHT:  135, + accels, neg decels, mod variability UC:   regular, every 3-5 minutes SVE:   Dilation: 3.5 Effacement (%): 70 Station: -1 Exam by:: Dr. Mancel Bale  Labs: Lab Results  Component Value Date   WBC 4.8 05/21/2022   HGB 11.8 (L) 05/21/2022   HCT 35.3 (L) 05/21/2022   MCV 73.1 (L) 05/21/2022   PLT 122 (L) 05/21/2022    Assessment / Plan: Induction of labor due to term and LGA with h/o shoulder dystocia for 30secs no deficits,  progressing well on pitocin  Labor:  Cont to titrate pitocin per protocol as indicated Preeclampsia:   no s/sxs Fetal Wellbeing:  Category I Pain Control:  Labor support without medications I/D:   GBS neg Anticipated MOD:  NSVD  Delice Lesch, MD 05/21/2022, 3:04 PM

## 2022-05-22 LAB — COMPREHENSIVE METABOLIC PANEL
ALT: 12 U/L (ref 0–44)
AST: 18 U/L (ref 15–41)
Albumin: 2.4 g/dL — ABNORMAL LOW (ref 3.5–5.0)
Alkaline Phosphatase: 91 U/L (ref 38–126)
Anion gap: 7 (ref 5–15)
BUN: 5 mg/dL — ABNORMAL LOW (ref 6–20)
CO2: 22 mmol/L (ref 22–32)
Calcium: 9 mg/dL (ref 8.9–10.3)
Chloride: 105 mmol/L (ref 98–111)
Creatinine, Ser: 0.61 mg/dL (ref 0.44–1.00)
GFR, Estimated: >60 mL/min (ref >60)
Glucose, Bld: 94 mg/dL (ref 70–99)
Potassium: 3.3 mmol/L — ABNORMAL LOW (ref 3.5–5.1)
Sodium: 134 mmol/L — ABNORMAL LOW (ref 135–145)
Total Bilirubin: 0.3 mg/dL (ref 0.3–1.2)
Total Protein: 5.3 g/dL — ABNORMAL LOW (ref 6.5–8.1)

## 2022-05-22 LAB — CBC
HCT: 33.1 % — ABNORMAL LOW (ref 36.0–46.0)
Hemoglobin: 10.5 g/dL — ABNORMAL LOW (ref 12.0–15.0)
MCH: 23.4 pg — ABNORMAL LOW (ref 26.0–34.0)
MCHC: 31.7 g/dL (ref 30.0–36.0)
MCV: 73.9 fL — ABNORMAL LOW (ref 80.0–100.0)
Platelets: 107 10*3/uL — ABNORMAL LOW (ref 150–400)
RBC: 4.48 MIL/uL (ref 3.87–5.11)
RDW: 14.8 % (ref 11.5–15.5)
WBC: 8 10*3/uL (ref 4.0–10.5)
nRBC: 0 % (ref 0.0–0.2)

## 2022-05-22 LAB — LACTATE DEHYDROGENASE: LDH: 142 U/L (ref 98–192)

## 2022-05-22 MED ORDER — POTASSIUM CHLORIDE CRYS ER 20 MEQ PO TBCR
40.0000 meq | EXTENDED_RELEASE_TABLET | Freq: Once | ORAL | Status: DC
  Filled 2022-05-22: qty 2

## 2022-05-22 MED ORDER — POTASSIUM CHLORIDE CRYS ER 20 MEQ PO TBCR
40.0000 meq | EXTENDED_RELEASE_TABLET | Freq: Two times a day (BID) | ORAL | Status: AC
  Administered 2022-05-22 (×2): 40 meq via ORAL
  Filled 2022-05-22 (×2): qty 2

## 2022-05-22 NOTE — Progress Notes (Signed)
Post Partum Day 1  Subjective: no complaints, up ad lib, voiding, tolerating PO, and + flatus  Objective: Blood pressure 120/65, pulse 80, temperature 98.4 F (36.9 C), temperature source Oral, resp. rate 18, height 5\' 7"  (1.702 m), weight 113.1 kg, SpO2 98 %, unknown if currently breastfeeding.  Physical Exam:  General: alert, cooperative, and no distress Lochia: appropriate, s/p TXA and cytotec immediately PP Uterine Fundus: firm Incision: n/a DVT Evaluation: No evidence of DVT seen on physical exam.  Recent Labs    05/21/22 2139 05/22/22 0517  HGB 11.5* 10.5*  HCT 36.5 33.1*    Assessment/Plan: Plan for discharge tomorrow, Breastfeeding and Bottle feeding, Lactation consult, and Contraception undecided  Met criteria for GHTN during admission BP normotensive since delivery. [place] labs wnl and PCR: 0.18. Mild hypokalemia, will replace and recheck in AM.   LOS: 1 day   Josefine Class, MD 05/22/2022, 11:33 AM

## 2022-05-22 NOTE — Lactation Note (Signed)
This note was copied from a baby's chart. Lactation Consultation Note  Patient Name: Lauren Potter Today's Date: 05/22/2022   Age:31 hours   LC Note:  Attempted to visit with mother, however, she is asleep at this time.  Will return later this morning.   Maternal Data    Feeding    LATCH Score                    Lactation Tools Discussed/Used    Interventions    Discharge    Consult Status      Lauren Potter 05/22/2022, 7:01 AM

## 2022-05-22 NOTE — Anesthesia Postprocedure Evaluation (Signed)
Anesthesia Post Note  Patient: Lauren Potter  Procedure(s) Performed: AN AD HOC LABOR EPIDURAL     Patient location during evaluation: Mother Baby Anesthesia Type: Epidural Level of consciousness: awake Pain management: satisfactory to patient Vital Signs Assessment: post-procedure vital signs reviewed and stable Respiratory status: spontaneous breathing Cardiovascular status: stable Anesthetic complications: no   No notable events documented.  Last Vitals:  Vitals:   05/21/22 2336 05/22/22 0357  BP: 118/76 120/65  Pulse: 81 80  Resp: 18 18  Temp: 37.7 C 36.9 C  SpO2: 100% 98%    Last Pain:  Vitals:   05/22/22 0543  TempSrc:   PainSc: 5    Pain Goal:                   Casimer Lanius

## 2022-05-22 NOTE — Lactation Note (Signed)
This note was copied from a baby's chart. Lactation Consultation Note  Patient Name: Lauren Potter [id number] Date: 05/22/2022 Reason for consult: Initial assessment;Term Age:31 hours   P3: Term infant at 40+0 weeks Feeding preference: Breast/formula Weight loss: 2%  Mother requested latch assistance.    Mother stated that "Hendricks Milo" is sleepy and will not wake up to feed.  Offered to assist with waking and latching; mother receptive.  Awakened baby, checked her diaper, gentle stimulation performed.  Taught hand expression; no drops noted at this time.  Assisted to latch "Hendricks Milo" in the cross cradle hold.  She remained sleepy, however, with constant stimulalion she began sucking.  Demonstrated breast compressions and encouraged mother to keep her actively engaged with her feedings.  Observed her feeding for 13 minutes while reviewing breast feeding basics.  Suggested mother call her RN/LC for latch assistance as needed.  Support person present.  RN updated.   Maternal Data Has patient been taught Hand Expression?: Yes Does the patient have breastfeeding experience prior to this delivery?: Yes How long did the patient breastfeed?: 6 months each with her other children  Feeding Mother's Current Feeding Choice: Breast Milk and Formula  LATCH Score Latch: Repeated attempts needed to sustain latch, nipple held in mouth throughout feeding, stimulation needed to elicit sucking reflex.  Audible Swallowing: None  Type of Nipple: Everted at rest and after stimulation  Comfort (Breast/Nipple): Soft / non-tender  Hold (Positioning): Assistance needed to correctly position infant at breast and maintain latch.  LATCH Score: 6   Lactation Tools Discussed/Used    Interventions Interventions: Breast feeding basics reviewed;Assisted with latch;Skin to skin;Breast massage;Hand express;Breast compression;Position options;Support pillows;Adjust position;Education;LC Services  brochure  Discharge Pump: Personal  Consult Status Consult Status: Follow-up Date: 05/23/22 Follow-up type: In-patient    Ebbie Cherry R Tunis Gentle 05/22/2022, 8:14 AM

## 2022-05-22 NOTE — Lactation Note (Signed)
This note was copied from a baby's chart. Lactation Consultation Note  Patient Name: Lauren Potter Today's Date: 05/22/2022   Age:31 hours   LC Note:  Late entry:  Discussed volume supplementation amounts with mother at the morning consult.  Reminded mother to always latch prior to giving formula supplementation.  Also informed her that she does not have to give any formula if baby breast feeds well.  This will be her choice if she desires to supplement.   Maternal Data    Feeding Nipple Type: Slow - flow  LATCH Score                    Lactation Tools Discussed/Used    Interventions    Discharge    Consult Status      Kalayla Shadden R Brystol Wasilewski 05/22/2022, 1:13 PM

## 2022-05-22 NOTE — Progress Notes (Signed)
CSW received consult for hx of Anxiety and Depression.  CSW met with MOB to offer support and complete assessment, MOB's mother present. CSW introduced self and explained role. MOB granted CSW verbal permission to speak in front of her MOB about anything. MOB was welcoming, pleasant, open, and remained engaged during assessment. CSW and MOB discussed MOB's mental health history. MOB reported that she experienced anxiety and depression around 2020 or 2021 after having her second child. MOB reported that the anxiety and depression was situational. MOB attributed anxiety/depression to burnout from work and being overwhelmed. 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 MOB reported that she was prescribed PRN medication and completed 2 therapy sessions for postpartum depression. MOB reported that her PCP Dr. Freda Jackson took her out of work for 2 months which was helpful. MOB reported that she rested during this time. MOB denied any current symptoms. MOB reported that she is not currently taking any medication nor participating in therapy to treat mental health diagnoses. MOB denied needing any therapy resources. CSW inquired about how MOB was feeling emotionally since giving birth, MOB reported that she was feeling well and explained that things have just been overwhelming. CSW acknowledged, validated, and normalized MOB's feelings. MOB presented calm and did not demonstrate any acute mental health signs/symptoms. CSW assessed for safety, MOB denied SI, HI, and domestic violence. CSW inquired about MOB's support system, MOB reported that her husband and mom are supports.   CSW provided education regarding the baby blues period vs. perinatal mood disorders, discussed treatment and gave resources for mental health follow up if concerns arise.  CSW recommends self-evaluation during the postpartum time period using the New Mom Checklist from Postpartum Progress and encouraged MOB to contact a  medical professional if symptoms are noted at any time.    CSW provided review of Sudden Infant Death Syndrome (SIDS) precautions. MOB verbalized understanding and reported having all items needed to care for infant including a car seat and basinet.  CSW inquired about any needs for resources/supports. MOB requested [facility] referral, CSW agreed to complete.   CSW completed requested [facility] referral.   CSW identifies no further need for intervention and no barriers to discharge at this time.  Abundio Miu, [place] Worker [facility] Cell#: [phone]

## 2022-05-23 DIAGNOSIS — O133 Gestational [pregnancy-induced] hypertension without significant proteinuria, third trimester: Secondary | ICD-10-CM | POA: Insufficient documentation

## 2022-05-23 DIAGNOSIS — Z8619 Personal history of other infectious and parasitic diseases: Secondary | ICD-10-CM | POA: Diagnosis not present

## 2022-05-23 LAB — POTASSIUM: Potassium: 3.8 mmol/L (ref 3.5–5.1)

## 2022-05-23 LAB — SURGICAL PATHOLOGY

## 2022-05-23 MED ORDER — IBUPROFEN 600 MG PO TABS: 600.0000 mg | ORAL_TABLET | Freq: Four times a day (QID) | ORAL | 0 refills | Status: DC

## 2022-05-23 MED ORDER — ACETAMINOPHEN 325 MG PO TABS: 650.0000 mg | ORAL_TABLET | ORAL | Status: DC | PRN

## 2022-05-23 NOTE — Lactation Note (Signed)
This note was copied from a baby's chart. Lactation Consultation Note  Patient Name: Lauren Potter [id number] Date: 05/23/2022 Reason for consult: Follow-up assessment;Term Age:32 hours   P3: Term infant at 40+0 weeks Feeding preference: Breast/formula Weight loss: 3%  Mother has been breast feeding and supplementing with formula.  Baby is consuming large volumes (35-60 mls/feeding).  Discussed volume supplementation guidelines yesterday with mother and she informed me that "Lauren Potter" is not satisfied with the lesser amounts.  She continues to cry until mother feeds her more volume.  Baby is voiding/stooling well.    Encouraged her to continue latching with every feeding to help ensure a good milk supply.  As mother's milk transitions, "Lauren Potter" will begin feeding more from the breast and less from the bottle.  Mother is feeling heavier this morning.  Breasts are soft and nipples intact.  Allowed time for questions.  Mother has our op phone number for any questions/concerns after discharge.  No support person present at this time.  RN updated.   Maternal Data    Feeding Mother's Current Feeding Choice: Breast Milk and Formula  LATCH Score                    Lactation Tools Discussed/Used    Interventions Interventions: Education;Breast feeding basics reviewed  Discharge Discharge Education: Engorgement and breast care Pump: Personal  Consult Status Consult Status: Complete Date: 05/23/22 Follow-up type: Call as needed    Lauren Potter 05/23/2022, 8:45 AM

## 2022-05-23 NOTE — Discharge Summary (Signed)
Postpartum Discharge Summary  Date of Service updated 05/23/22    Patient Name: Lauren Potter DOB: 1991-02-10 MRN: [id number]  Date of admission: 05/21/2022 Delivery date:05/21/2022  Delivering provider: Drema Dallas  Date of discharge: 05/23/2022  Admitting diagnosis: Pregnant [Z34.90] Intrauterine pregnancy: [redacted]w[redacted]d     Secondary diagnosis: gestational hypertension, Postpartum care following vaginal delivery, history of HSV 1 via blood work  Additional problems: Mild hypokalemia     Discharge diagnosis: Term Pregnancy Delivered and Gestational Hypertension                                              Post partum procedures: none Augmentation: AROM and Pitocin Complications: thick, particulate meconium  Hospital course: Induction of Labor With Vaginal Delivery   32 y.o. yo [phone] at [redacted]w[redacted]d was admitted to the hospital 05/21/2022 for induction of labor.  Indication for induction:  possible large for gestation infant and history of macrosomia with shoulder dystocia .  Patient had an labor course was uncomplicated Membrane Rupture Time/Date: 12:44 PM ,05/21/2022   Delivery Method:Vaginal, Spontaneous  Episiotomy: None  Lacerations:  None  Details of delivery can be found in separate delivery note.  Patient had a postpartum course complicated by Mild hypokalemia with replacement. Patient is discharged home 05/23/22.  Newborn Data: Birth date:05/21/2022  Birth time:8:04 PM  Gender:Female  Living status:Living  Apgars:8 ,9  Weight:3930 g   Magnesium Sulfate received: No BMZ received: No Rhophylac:N/A MMR:N/A Transfusion:No  Physical exam  Vitals:   05/22/22 1237 05/22/22 2134 05/23/22 0500 05/23/22 1300  BP: 124/63 130/74 119/63 120/70  Pulse: 82 100 66 66  Resp: 18 20 18 19   Temp: 98.3 F (36.8 C) 98.4 F (36.9 C)  98 F (36.7 C)  TempSrc:  Oral  Oral  SpO2: 100% 99% 100%   Weight:      Height:       General: alert, cooperative, and no distress Lochia:  appropriate Uterine Fundus: firm Incision: N/A DVT Evaluation: No evidence of DVT seen on physical exam. No cords or calf tenderness. No significant calf/ankle edema. Labs: Lab Results  Component Value Date   WBC 8.0 05/22/2022   HGB 10.5 (L) 05/22/2022   HCT 33.1 (L) 05/22/2022   MCV 73.9 (L) 05/22/2022   PLT 107 (L) 05/22/2022      Latest Ref Rng & Units 05/23/2022    5:24 AM  CMP  Potassium 3.5 - 5.1 mmol/L 3.8    Edinburgh Score:    05/22/2022    5:59 PM  Edinburgh Postnatal Depression Scale Screening Tool  I have been able to laugh and see the funny side of things. 1  I have looked forward with enjoyment to things. 0  I have blamed myself unnecessarily when things went wrong. 1  I have been anxious or worried for no good reason. 0  I have felt scared or panicky for no good reason. 0  Things have been getting on top of me. 1  I have been so unhappy that I have had difficulty sleeping. 1  I have felt sad or miserable. 2  I have been so unhappy that I have been crying. 1  The thought of harming myself has occurred to me. 0  Edinburgh Postnatal Depression Scale Total 7      After visit meds:  Allergies as of 05/23/2022  Reactions   Dust Mite Extract Other (See Comments)   Sneezing  Watery eyes        Medication List     TAKE these medications    acetaminophen 325 MG tablet Commonly known as: Tylenol Take 2 tablets (650 mg total) by mouth every 4 (four) hours as needed (for pain scale < 4).   FERROUS SULFATE PO Take 1 tablet by mouth daily.   ibuprofen 600 MG tablet Commonly known as: ADVIL Take 1 tablet (600 mg total) by mouth every 6 (six) hours.   PRENATAL PO Take 1 tablet by mouth daily.   VITAMIN D-3 PO Take 1 tablet by mouth daily.         Discharge home in stable condition Infant Feeding: Breast and formula Infant Disposition:home with mother Discharge instruction: per After Visit Summary and Postpartum booklet. Activity:  Advance as tolerated. Pelvic rest for 6 weeks.  Diet: routine diet Anticipated Birth Control:  natural family planning Postpartum Appointment:6 weeks Additional Postpartum F/U: BP check 2-3 days Future Appointments:No future appointments. Follow up Visit:      05/23/2022 Arrie Eastern, CNM

## 2022-05-26 ENCOUNTER — Telehealth: Payer: Self-pay

## 2022-05-26 NOTE — Patient Outreach (Signed)
  Care Coordination TOC Note Transition Care Management Unsuccessful Follow-up Telephone Call  Date of discharge and from where:  05/23/22-[place]  Attempts:  1st Attempt  Reason for unsuccessful TCM follow-up call:  Left voice message     Hetty Blend [place] Management Telephonic Care Management Coordinator Direct Phone: [phone] Toll Free: [phone] Fax: [phone]

## 2022-05-27 ENCOUNTER — Telehealth: Payer: Self-pay

## 2022-05-27 NOTE — Patient Outreach (Signed)
  Care Coordination TOC Note Transition Care Management Unsuccessful Follow-up Telephone Call  Date of discharge and from where:  05/23/22-[place]  Attempts:  2nd Attempt  Reason for unsuccessful TCM follow-up call:  Left voice message     Hetty Blend [place] Management Telephonic Care Management Coordinator Direct Phone: [phone] Toll Free: [phone] Fax: [phone]

## 2022-05-27 NOTE — Patient Outreach (Signed)
  Care Coordination [facility] Note Transition Care Management Follow-up Telephone Call Date of discharge and from where: 1/19/24[facility]  Dx:"spontaneous vaginal delivery" How have you been since you were released from the hospital? Incoming call from patient returning RN CM call. She voices that she is doing good and so is the baby. They saw pediatrician on yesterday. She reports that she has some pain but controlled with current regimen. Appetite good. No issues with elimination. She still has some very light "spotting." Patient states her legs a re slightly swollen. Encouraged to elevate. She has BP machine in the home but has not used it. Encouraged to check BP daily and report any abnormal readings. Also, discussed limiting salt in diet. Patient voiced understanding.  Any questions or concerns? No  Items Reviewed: Did the pt receive and understand the discharge instructions provided? Yes  Medications obtained and verified? Yes  Other? No  Any new allergies since your discharge? No  Dietary orders reviewed? Yes Do you have support at home? Yes   Home Care and Equipment/Supplies: Were home health services ordered? not applicable If so, what is the name of the agency? N/A  Has the agency set up a time to come to the patient's home? not applicable Were any new equipment or medical supplies ordered?  No What is the name of the medical supply agency? N/A Were you able to get the supplies/equipment? not applicable Do you have any questions related to the use of the equipment or supplies? No  Functional Questionnaire: (I = Independent and D = Dependent) ADLs: I  Bathing/Dressing- I  Meal Prep- I  Eating- I  Maintaining continence- I  Transferring/Ambulation- I  Managing Meds- I  Follow up appointments reviewed:  PCP Hospital f/u appt confirmed? No   Specialist Hospital f/u appt confirmed? Yes  Scheduled to see OB/GYN on 05/30/22 for BP check and then has 6wk follow up appt as  well. Are transportation arrangements needed? No  If their condition worsens, is the pt aware to call PCP or go to the Emergency Dept.? Yes Was the patient provided with contact information for the PCP's office or ED? Yes Was to pt encouraged to call back with questions or concerns? Yes  SDOH assessments and interventions completed:   Yes SDOH Interventions Today    Flowsheet Row Most Recent Value  SDOH Interventions   Food Insecurity Interventions Intervention Not Indicated  Transportation Interventions Intervention Not Indicated       Care Coordination Interventions:  Education provided regarding post delivery care, BP mgmt    Encounter Outcome:  Pt. Visit Completed    Enzo Montgomery, RN,BSN,CCM [place] Management Telephonic Care Management Coordinator Direct Phone: [phone] Toll Free: [phone] Fax: [phone]

## 2022-05-29 ENCOUNTER — Telehealth (HOSPITAL_COMMUNITY): Payer: Self-pay | Admitting: *Deleted

## 2022-05-29 NOTE — Telephone Encounter (Signed)
Attempted Hospital Discharge Follow-Up Call.  Left voice mail requesting that patient return RN's phone call if patient has any concerns or questions regarding herself or her baby.  

## 2022-05-30 DIAGNOSIS — O169 Unspecified maternal hypertension, unspecified trimester: Secondary | ICD-10-CM | POA: Diagnosis not present

## 2022-06-24 ENCOUNTER — Encounter: Payer: Self-pay | Admitting: Hematology and Oncology

## 2022-06-25 DIAGNOSIS — F32A Depression, unspecified: Secondary | ICD-10-CM | POA: Diagnosis not present

## 2022-06-25 DIAGNOSIS — O418X99 Other specified disorders of amniotic fluid and membranes, unspecified trimester, other fetus: Secondary | ICD-10-CM | POA: Diagnosis not present

## 2022-06-25 DIAGNOSIS — D649 Anemia, unspecified: Secondary | ICD-10-CM | POA: Diagnosis not present

## 2022-06-25 DIAGNOSIS — B009 Herpesviral infection, unspecified: Secondary | ICD-10-CM | POA: Diagnosis not present

## 2022-06-25 DIAGNOSIS — D6949 Other primary thrombocytopenia: Secondary | ICD-10-CM | POA: Diagnosis not present

## 2022-06-25 DIAGNOSIS — K5909 Other constipation: Secondary | ICD-10-CM | POA: Diagnosis not present

## 2022-06-25 DIAGNOSIS — Z8759 Personal history of other complications of pregnancy, childbirth and the puerperium: Secondary | ICD-10-CM | POA: Diagnosis not present

## 2022-07-08 ENCOUNTER — Encounter: Payer: Self-pay | Admitting: Hematology and Oncology

## 2022-08-14 ENCOUNTER — Encounter: Payer: Self-pay | Admitting: Hematology and Oncology

## 2022-10-07 DIAGNOSIS — Z3482 Encounter for supervision of other normal pregnancy, second trimester: Secondary | ICD-10-CM | POA: Diagnosis not present

## 2022-10-07 DIAGNOSIS — Z3483 Encounter for supervision of other normal pregnancy, third trimester: Secondary | ICD-10-CM | POA: Diagnosis not present

## 2022-10-24 DIAGNOSIS — Z3482 Encounter for supervision of other normal pregnancy, second trimester: Secondary | ICD-10-CM | POA: Diagnosis not present

## 2022-10-24 DIAGNOSIS — Z3483 Encounter for supervision of other normal pregnancy, third trimester: Secondary | ICD-10-CM | POA: Diagnosis not present

## 2022-10-31 ENCOUNTER — Other Ambulatory Visit: Payer: Self-pay

## 2022-11-12 DIAGNOSIS — Z01419 Encounter for gynecological examination (general) (routine) without abnormal findings: Secondary | ICD-10-CM | POA: Diagnosis not present

## 2023-01-25 ENCOUNTER — Encounter: Payer: Self-pay | Admitting: Hematology and Oncology

## 2023-01-29 ENCOUNTER — Ambulatory Visit: Payer: No Typology Code available for payment source | Admitting: Family Medicine

## 2023-01-30 ENCOUNTER — Ambulatory Visit (INDEPENDENT_AMBULATORY_CARE_PROVIDER_SITE_OTHER): Payer: No Typology Code available for payment source | Admitting: Family Medicine

## 2023-01-30 VITALS — BP 112/64 | HR 103 | Temp 98.7°F | Ht 67.0 in | Wt 222.4 lb

## 2023-01-30 DIAGNOSIS — J029 Acute pharyngitis, unspecified: Secondary | ICD-10-CM | POA: Diagnosis not present

## 2023-01-30 DIAGNOSIS — R051 Acute cough: Secondary | ICD-10-CM

## 2023-01-30 LAB — POCT RESPIRATORY SYNCYTIAL VIRUS: RSV Rapid Ag: NEGATIVE

## 2023-01-30 LAB — POCT RAPID STREP A (OFFICE): Rapid Strep A Screen: NEGATIVE

## 2023-01-30 LAB — POC INFLUENZA A&B (BINAX/QUICKVUE)
Influenza A, POC: NEGATIVE
Influenza B, POC: NEGATIVE

## 2023-01-30 LAB — POC COVID19 BINAXNOW: SARS Coronavirus 2 Ag: NEGATIVE

## 2023-01-30 MED ORDER — DOXYCYCLINE HYCLATE 100 MG PO TABS: 100.0000 mg | ORAL_TABLET | Freq: Two times a day (BID) | ORAL | 0 refills | Status: AC

## 2023-01-30 MED ORDER — PREDNISONE 10 MG PO TABS: ORAL_TABLET | ORAL | 0 refills | Status: AC

## 2023-01-30 NOTE — Progress Notes (Signed)
Acute Office Visit   Subjective:  Patient ID: Lauren Potter, female    DOB: [birth year], 32 y.o.   MRN: [id number]  Chief Complaint  Patient presents with   Cough   Sore Throat    Pt reports she has had sx of sore throat, cough and chest pain with cough. Pt reports he kids has strep throat. Sx stated 1 wk Pt reports she has used warm salt water and baking soda water. Pt reports she has had this sore throat on and off.    HPI Patient is a 32 year old female that presents for an acute visit. She is experiencing intermittent sore throat, intermittent headache, nasal congestion/drainage, productive cough-green, thick sputum, and chest pain with cough.   Denies ear pain or drainage, SHOB, or fever.   Symptoms started a week ago.  She reports she has been gargling with warm salt water and baking soda water.   Patients children has had strep throat.   ROS See HPI above      Objective:   BP 112/64   Pulse (!) 103   Temp 98.7 F (37.1 C)   Ht 5\' 7"  (1.702 m)   Wt 222 lb 6 oz (100.9 kg)   SpO2 97%   BMI 34.83 kg/m    Physical Exam Vitals reviewed.  Constitutional:      General: She is not in acute distress.    Appearance: Normal appearance. She is well-developed. She is obese. She is not ill-appearing, toxic-appearing or diaphoretic.  HENT:     Head: Normocephalic and atraumatic.     Right Ear: Tympanic membrane and ear canal normal. There is no impacted cerumen.     Left Ear: Tympanic membrane and ear canal normal. There is no impacted cerumen.     Nose:     Right Sinus: No maxillary sinus tenderness or frontal sinus tenderness.     Left Sinus: No maxillary sinus tenderness or frontal sinus tenderness.     Mouth/Throat:     Mouth: Mucous membranes are moist.     Pharynx: Oropharynx is clear. Uvula midline. No pharyngeal swelling, oropharyngeal exudate, posterior oropharyngeal erythema, uvula swelling or postnasal drip.  Eyes:     General:        Right eye: No  discharge.        Left eye: No discharge.     Conjunctiva/sclera: Conjunctivae normal.  Cardiovascular:     Rate and Rhythm: Normal rate and regular rhythm.     Heart sounds: Normal heart sounds. No murmur heard.    No friction rub. No gallop.  Pulmonary:     Effort: Pulmonary effort is normal. No respiratory distress.     Comments: Cough during visit. Decreased breath sounds, posterior  Musculoskeletal:        General: Normal range of motion.  Lymphadenopathy:     Head:     Right side of head: No submental, submandibular or tonsillar adenopathy.     Left side of head: No submental, submandibular or tonsillar adenopathy.  Skin:    General: Skin is warm and dry.  Neurological:     General: No focal deficit present.     Mental Status: She is alert and oriented to person, place, and time. Mental status is at baseline.  Psychiatric:        Mood and Affect: Mood normal.        Behavior: Behavior normal.        Thought Content: Thought content normal.  Judgment: Judgment normal.    Results for orders placed or performed in visit on 01/30/23  POC COVID-19  Result Value Ref Range   SARS Coronavirus 2 Ag Negative Negative  POCT rapid strep A  Result Value Ref Range   Rapid Strep A Screen Negative Negative  POC Influenza A&B (Binax test)  Result Value Ref Range   Influenza A, POC Negative Negative   Influenza B, POC Negative Negative  POCT respiratory syncytial virus  Result Value Ref Range   RSV Rapid Ag NEG       Assessment & Plan:  Acute cough -     POC COVID-19 BinaxNow -     POC Influenza A&B(BINAX/QUICKVUE) -     predniSONE; Take 6 tablets (60 mg total) by mouth daily with breakfast for 1 day, THEN 5 tablets (50 mg total) daily with breakfast for 1 day, THEN 4 tablets (40 mg total) daily with breakfast for 1 day, THEN 3 tablets (30 mg total) daily with breakfast for 1 day, THEN 2 tablets (20 mg total) daily with breakfast for 1 day, THEN 1 tablet (10 mg total) daily  with breakfast for 1 day.  Dispense: 21 tablet; Refill: 0 -     Doxycycline Hyclate; Take 1 tablet (100 mg total) by mouth 2 (two) times daily for 7 days.  Dispense: 14 tablet; Refill: 0 -     POCT respiratory syncytial virus  Sore throat -     POCT rapid strep A  -Rapid covid, strep throat, RSV, and influenza are negative.  -Since symptoms have been going for over a week with decreased breath sounds and cough, recommend to start Doxycycline 100mg  1 tablet, twice a day for 7 days. Also, start Prednisone 10mg , 6 day taper.  -Please do not take additional NSAIDS, such as Ibuprofen, Advil, Aleve, and Naproxen while taking Prednisone. Recommend to eat something when taking Prednisone. -If symptoms do not improve, follow up with primary care provider.   Zandra Abts, NP

## 2023-01-30 NOTE — Patient Instructions (Addendum)
-  Rapid covid, strep throat, RSV, and influenza are negative.  -Since symptoms have been going for over a week with decreased breath sounds and cough, recommend to start Doxycycline 100mg  1 tablet, twice a day for 7 days. Also, start Prednisone 10mg , 6 day taper.  -Please do not take additional NSAIDS, such as Ibuprofen, Advil, Aleve, and Naproxen while taking Prednisone. Recommend to eat something when taking Prednisone. -If symptoms do not improve, follow up with primary care provider.

## 2023-02-12 ENCOUNTER — Encounter: Payer: Self-pay | Admitting: Physician Assistant

## 2023-02-12 ENCOUNTER — Ambulatory Visit: Payer: No Typology Code available for payment source | Admitting: Physician Assistant

## 2023-02-12 VITALS — BP 110/70 | HR 68 | Temp 97.1°F | Ht 67.0 in | Wt 224.5 lb

## 2023-02-12 DIAGNOSIS — H6991 Unspecified Eustachian tube disorder, right ear: Secondary | ICD-10-CM | POA: Diagnosis not present

## 2023-02-12 DIAGNOSIS — G4452 New daily persistent headache (NDPH): Secondary | ICD-10-CM | POA: Diagnosis not present

## 2023-02-12 DIAGNOSIS — Z8673 Personal history of transient ischemic attack (TIA), and cerebral infarction without residual deficits: Secondary | ICD-10-CM | POA: Diagnosis not present

## 2023-02-12 DIAGNOSIS — R5383 Other fatigue: Secondary | ICD-10-CM | POA: Diagnosis not present

## 2023-02-12 LAB — COMPREHENSIVE METABOLIC PANEL
ALT: 10 U/L (ref 0–35)
AST: 10 U/L (ref 0–37)
Albumin: 4.3 g/dL (ref 3.5–5.2)
Alkaline Phosphatase: 36 U/L — ABNORMAL LOW (ref 39–117)
BUN: 6 mg/dL (ref 6–23)
CO2: 28 meq/L (ref 19–32)
Calcium: 9.7 mg/dL (ref 8.4–10.5)
Chloride: 102 meq/L (ref 96–112)
Creatinine, Ser: 0.6 mg/dL (ref 0.40–1.20)
GFR: 119.72 mL/min (ref >60.00)
Glucose, Bld: 95 mg/dL (ref 70–99)
Potassium: 4.1 meq/L (ref 3.5–5.1)
Sodium: 138 meq/L (ref 135–145)
Total Bilirubin: 0.4 mg/dL (ref 0.2–1.2)
Total Protein: 6.7 g/dL (ref 6.0–8.3)

## 2023-02-12 LAB — CBC WITH DIFFERENTIAL/PLATELET
Basophils Absolute: 0 10*3/uL (ref 0.0–0.1)
Basophils Relative: 0.3 % (ref 0.0–3.0)
Eosinophils Absolute: 0 10*3/uL (ref 0.0–0.7)
Eosinophils Relative: 0.7 % (ref 0.0–5.0)
HCT: 38.4 % (ref 36.0–46.0)
Hemoglobin: 12 g/dL (ref 12.0–15.0)
Lymphocytes Relative: 37.9 % (ref 12.0–46.0)
Lymphs Abs: 2 10*3/uL (ref 0.7–4.0)
MCHC: 31.3 g/dL (ref 30.0–36.0)
MCV: 72 fL — ABNORMAL LOW (ref 78.0–100.0)
Monocytes Absolute: 0.3 10*3/uL (ref 0.1–1.0)
Monocytes Relative: 6.4 % (ref 3.0–12.0)
Neutro Abs: 3 10*3/uL (ref 1.4–7.7)
Neutrophils Relative %: 54.7 % (ref 43.0–77.0)
Platelets: 190 10*3/uL (ref 150.0–400.0)
RBC: 5.33 Mil/uL — ABNORMAL HIGH (ref 3.87–5.11)
RDW: 16 % — ABNORMAL HIGH (ref 11.5–15.5)
WBC: 5.4 10*3/uL (ref 4.0–10.5)

## 2023-02-12 LAB — URINALYSIS, ROUTINE W REFLEX MICROSCOPIC
Bilirubin Urine: NEGATIVE
Hgb urine dipstick: NEGATIVE
Ketones, ur: NEGATIVE
Nitrite: NEGATIVE
Specific Gravity, Urine: 1.02 (ref 1.000–1.030)
Total Protein, Urine: NEGATIVE
Urine Glucose: NEGATIVE
Urobilinogen, UA: 0.2 (ref 0.0–1.0)
pH: 6 (ref 5.0–8.0)

## 2023-02-12 LAB — LIPID PANEL
Cholesterol: 155 mg/dL (ref 0–200)
HDL: 55.5 mg/dL (ref >39.00)
LDL Cholesterol: 80 mg/dL (ref 0–99)
NonHDL: 99.02
Total CHOL/HDL Ratio: 3
Triglycerides: 93 mg/dL (ref 0.0–149.0)
VLDL: 18.6 mg/dL (ref 0.0–40.0)

## 2023-02-12 LAB — VITAMIN D 25 HYDROXY (VIT D DEFICIENCY, FRACTURES): VITD: 26.05 ng/mL — ABNORMAL LOW (ref 30.00–100.00)

## 2023-02-12 LAB — IBC + FERRITIN
Ferritin: 16.1 ng/mL (ref 10.0–291.0)
Iron: 70 ug/dL (ref 42–145)
Saturation Ratios: 18.3 % — ABNORMAL LOW (ref 20.0–50.0)
TIBC: 382.2 ug/dL (ref 250.0–450.0)
Transferrin: 273 mg/dL (ref 212.0–360.0)

## 2023-02-12 LAB — POCT URINE PREGNANCY: Preg Test, Ur: NEGATIVE

## 2023-02-12 LAB — VITAMIN B12: Vitamin B-12: 411 pg/mL (ref 211–911)

## 2023-02-12 LAB — TSH: TSH: 1.3 u[IU]/mL (ref 0.35–5.50)

## 2023-02-12 MED ORDER — AZELASTINE HCL 0.1 % NA SOLN: 1.0000 | Freq: Two times a day (BID) | NASAL | 12 refills | Status: DC

## 2023-02-12 NOTE — Patient Instructions (Signed)
It was great to see you!  We are going to get blood work and urine studies today  Please stay by your phone so we can schedule your MRI If any worsening symptom(s), please go to the ER  Recommend astelin nasal spray in both nostrils in AM and PM to help with your ear popping -- keep me posted on symptom(s)   Take care,  Jarold Motto PA-C

## 2023-02-12 NOTE — Progress Notes (Signed)
Lauren Potter is a 32 y.o. female here for a new problem.  History of Present Illness:   Chief Complaint  Patient presents with  . Headache    Pt still c/o headaches, mainly on right side of head, blurred vision sometimes. Right ear feels blocked, Brain fog.Taking Tylenol.  . Fatigue    Pt still c/o feeling tired.    HPI  Headache: Complains of experiencing constant headaches, blurred vision, ear blockage, fatigue, and brain fog for over a month. Endorses intermittent vision blurring (for about 15 seconds), mostly right-sided headache, currently mostly right-sided ear-blockage. States she is managing symptoms with Tylenol only, but the Prednisone from her recent visit with Zandra Abts, NP didn't relieve any of her described symptoms.  Also does have history of anemia and endorses occasional heavy periods. Managed with daily iron PO.   8 months post-partum and she states she didn't take her iron for 2-3 months since SVD on 1/17 and her fatigue does take some time to resolve after resuming iron.  States she is getting enough sleep and drink mostly enough water.  Denies chance of pregnancy (endorses sex w/o BC), rhinorrhea, allergy regimen, or right-sided focal symptoms.   History of TIA Recalled when she was 12 she had brain imaging done after experiencing right-sided weakness, numbness, and drop in school performance without discernible reason.  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  Symptoms resolved after a year of therapy and possibly Depakine, though she can't recall specifically.   Past Medical History:  Diagnosis Date  . Frequent sinus infections   . Iron deficiency anemia     Social History   Tobacco Use  . Smoking status: Never  . Smokeless tobacco: Never  Vaping Use  . Vaping status: Never Used  Substance Use Topics  . Alcohol use: No  . Drug use: No   Past Surgical History:  Procedure Laterality Date  . APPENDECTOMY     Family History   Problem Relation Age of Onset  . Diabetes Mother   . Hypertension Father   . Diabetes Maternal Grandmother   . Diabetes Maternal Grandfather   . Hypertension Paternal Grandmother   . Colon cancer Neg Hx   . Esophageal cancer Neg Hx   . Inflammatory bowel disease Neg Hx   . Liver disease Neg Hx   . Pancreatic cancer Neg Hx   . Rectal cancer Neg Hx   . Stomach cancer Neg Hx    Allergies  Allergen Reactions  . Dust Mite Extract Other (See Comments)    Sneezing  Watery eyes   Current Medications:   Current Outpatient Medications:  .  acetaminophen (TYLENOL) 325 MG tablet, Take 2 tablets (650 mg total) by mouth every 4 (four) hours as needed (for pain scale < 4)., Disp: , Rfl:  .  azelastine (ASTELIN) 0.1 % nasal spray, Place 1 spray into both nostrils 2 (two) times daily. Use in each nostril as directed, Disp: 30 mL, Rfl: 12 .  Cholecalciferol (VITAMIN D-3 PO), Take 1 tablet by mouth daily., Disp: , Rfl:  .  FERROUS SULFATE PO, Take 1 tablet by mouth daily., Disp: , Rfl:   Review of Systems:   ROS See pertinent positives and negatives as per the HPI.  Vitals:   Vitals:   02/12/23 0901  BP: 110/70  Pulse: 68  Temp: (!) 97.1 F (36.2 C)  TempSrc: Temporal  SpO2: 98%  Weight: 224 lb 8 oz (101.8 kg)  Height: 5\' 7"  (1.702 m)  Body mass index is 35.16 kg/m.  Physical Exam:   Physical Exam Vitals and nursing note reviewed.  Constitutional:      General: She is not in acute distress.    Appearance: She is well-developed. She is not ill-appearing or toxic-appearing.  HENT:     Head: Normocephalic and atraumatic.     Right Ear: Ear canal and external ear normal. A middle ear effusion is present. Tympanic membrane is not erythematous, retracted or bulging.     Left Ear: Tympanic membrane, ear canal and external ear normal. Tympanic membrane is not erythematous, retracted or bulging.     Nose: Nose normal.     Right Sinus: No maxillary sinus tenderness or frontal sinus  tenderness.     Left Sinus: No maxillary sinus tenderness or frontal sinus tenderness.     Mouth/Throat:     Pharynx: Uvula midline. No posterior oropharyngeal erythema.  Eyes:     General: Lids are normal.     Conjunctiva/sclera: Conjunctivae normal.  Neck:     Trachea: Trachea normal.  Cardiovascular:     Rate and Rhythm: Normal rate and regular rhythm.     Heart sounds: Normal heart sounds, S1 normal and S2 normal.  Pulmonary:     Effort: Pulmonary effort is normal.     Breath sounds: Normal breath sounds. No decreased breath sounds, wheezing, rhonchi or rales.  Lymphadenopathy:     Cervical: No cervical adenopathy.  Skin:    General: Skin is warm and dry.  Neurological:     General: No focal deficit present.     Mental Status: She is alert.     Cranial Nerves: Cranial nerves 2-12 are intact.     Sensory: Sensation is intact.     Motor: Motor function is intact.     Coordination: Coordination is intact.     Gait: Gait is intact.  Psychiatric:        Speech: Speech normal.        Behavior: Behavior normal. Behavior is cooperative.     Assessment and Plan:   New daily persistent headache; History of TIA (transient ischemic attack) Unclear etiology Due to history of neurological issues (seizure? Transient ishemic attack (TIA)? Other?) when she was a child and persistent symptom(s), will obtain stat MRI to rule out mass, infection, or other neurological condition If any worsening symptom(s), recommend ER evaluation asap  Other fatigue No red flags on exam Will obtain blood work to rule out organic cause of symptom(s) Follow-up with [place] in 1 month for transfer of care visit  Dysfunction of right eustachian tube No red flags Recommend astelin nasal spray to help with this Follow-up if new/worsening, lack of improvement  I,Emily Lagle,acting as a scribe for [employer], PA.,have documented all relevant documentation on the behalf of Lauren Motto, PA,as directed by   Lauren Motto, PA while in the presence of Lauren Potter, [place].  I, Lauren Potter, [place], have reviewed all documentation for this visit. The documentation on 02/12/23 for the exam, diagnosis, procedures, and orders are all accurate and complete.  Lauren Motto, PA-C

## 2023-02-12 NOTE — Addendum Note (Signed)
Addended by: Haynes Bast on: 02/12/2023 10:44 AM   Modules accepted: Orders

## 2023-02-13 ENCOUNTER — Encounter: Payer: Self-pay | Admitting: Physician Assistant

## 2023-02-16 ENCOUNTER — Other Ambulatory Visit: Payer: Self-pay | Admitting: Physician Assistant

## 2023-02-16 ENCOUNTER — Encounter: Payer: Self-pay | Admitting: Physician Assistant

## 2023-02-16 DIAGNOSIS — Z8673 Personal history of transient ischemic attack (TIA), and cerebral infarction without residual deficits: Secondary | ICD-10-CM

## 2023-02-16 DIAGNOSIS — G4452 New daily persistent headache (NDPH): Secondary | ICD-10-CM

## 2023-02-16 DIAGNOSIS — R9389 Abnormal findings on diagnostic imaging of other specified body structures: Secondary | ICD-10-CM

## 2023-02-16 MED ORDER — AMOXICILLIN-POT CLAVULANATE 875-125 MG PO TABS: 1.0000 | ORAL_TABLET | Freq: Two times a day (BID) | ORAL | 0 refills | Status: DC

## 2023-02-18 ENCOUNTER — Encounter: Payer: Self-pay | Admitting: Hematology and Oncology

## 2023-02-25 ENCOUNTER — Ambulatory Visit: Payer: No Typology Code available for payment source | Admitting: Neurology

## 2023-02-25 ENCOUNTER — Encounter: Payer: Self-pay | Admitting: Neurology

## 2023-02-25 VITALS — BP 111/72 | HR 63 | Ht 61.0 in | Wt 223.0 lb

## 2023-02-25 DIAGNOSIS — G8929 Other chronic pain: Secondary | ICD-10-CM | POA: Diagnosis not present

## 2023-02-25 DIAGNOSIS — R9401 Abnormal electroencephalogram [EEG]: Secondary | ICD-10-CM | POA: Diagnosis not present

## 2023-02-25 DIAGNOSIS — Z8669 Personal history of other diseases of the nervous system and sense organs: Secondary | ICD-10-CM

## 2023-02-25 DIAGNOSIS — R4189 Other symptoms and signs involving cognitive functions and awareness: Secondary | ICD-10-CM

## 2023-02-25 DIAGNOSIS — H539 Unspecified visual disturbance: Secondary | ICD-10-CM

## 2023-02-25 DIAGNOSIS — G932 Benign intracranial hypertension: Secondary | ICD-10-CM

## 2023-02-25 DIAGNOSIS — R519 Headache, unspecified: Secondary | ICD-10-CM | POA: Diagnosis not present

## 2023-02-25 DIAGNOSIS — H4710 Unspecified papilledema: Secondary | ICD-10-CM

## 2023-02-25 DIAGNOSIS — G08 Intracranial and intraspinal phlebitis and thrombophlebitis: Secondary | ICD-10-CM | POA: Diagnosis not present

## 2023-02-25 NOTE — Progress Notes (Signed)
[facility]    Provider:  Dr Lucia Gaskins Requesting Provider: Jarold Motto, PA Primary Care Provider:  Janeece Agee, NP  CC:  Headache  HPI:  Lauren Potter is a 32 y.o. female here as requested by Jarold Motto, PA for Headache. 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  Patient experiencing headaches and cognitive problems. When 12 had an abnormal eeg she was dropping things with weakness, emg/ncs was abnormal and the eeg was abnormal. She doesn;t have the right-sided weakness and numbness but having epsodes of brain fog, like her brain is heavy and her head is heavy. She can have it for 3 hours and then leaves, may come back, thought it was her glasses but she had new glasses. She can sometimes look and have to refocus. She has headaches more on the top of the head and pressure or like a tight ponytail and can get numb after a while. Bilateral (points to the parietal lobes) sometimes more on the right side. Even combing her hair hurts. The headaches are pretty continuous, she has a newborm but has a lot of help and baby sleeps through the night so she gest good sleep. Started after the pregnancy. Gained a lot of weight. She was 198 prior to pregnancy and went to 240 and she is around 220 now. No light or sounds sensitivity, not pulsating or pounding, some mild nausea but not vomiting. More  pressure and decreased focus. Feels like ears are filled up. No other focal neurologic deficits, associated symptoms, inciting events or modifiable factors.   Reviewed notes, labs and imaging from outside physicians, which showed:  Recent Results (from the past 2160 hour(s))  POCT rapid strep A     Status: Normal   Collection Time: 01/30/23  2:37 PM  Result Value Ref Range   Rapid Strep A Screen Negative Negative  POC Influenza A&B (Binax test)     Status: Normal   Collection Time: 01/30/23  2:37 PM  Result Value Ref Range   Influenza A, POC Negative Negative   Influenza B, POC Negative Negative  POC COVID-19     Status: Normal   Collection Time: 01/30/23  2:38 PM  Result Value Ref Range   SARS Coronavirus 2 Ag Negative Negative  POCT respiratory syncytial virus     Status: Normal   Collection Time: 01/30/23  3:07 PM  Result Value Ref Range   RSV Rapid Ag NEG   CBC with Differential/Platelet     Status: Abnormal   Collection Time: 02/12/23  9:37 AM  Result Value Ref Range   WBC 5.4 4.0 - 10.5 K/uL   RBC 5.33 (H) 3.87 - 5.11 Mil/uL   Hemoglobin 12.0 12.0 - 15.0 g/dL   HCT 40.9 81.1 - 91.4 %   MCV 72.0 (L) 78.0 - 100.0 fl   MCHC 31.3 30.0 - 36.0 g/dL   RDW 78.2 (H) 95.6 - 21.3 %   Platelets 190.0 150.0 - 400.0 K/uL   Neutrophils Relative % 54.7 43.0 - 77.0 %   Lymphocytes Relative 37.9 12.0 -  46.0 %   Monocytes Relative 6.4 3.0 - 12.0 %   Eosinophils Relative 0.7 0.0 - 5.0 %   Basophils Relative 0.3 0.0 - 3.0 %   Neutro Abs 3.0 1.4 - 7.7 K/uL   Lymphs Abs 2.0 0.7 - 4.0 K/uL   Monocytes Absolute 0.3 0.1 - 1.0 K/uL   Eosinophils Absolute 0.0 0.0 - 0.7 K/uL   Basophils Absolute 0.0 0.0 - 0.1 K/uL  Comprehensive metabolic panel     Status: Abnormal   Collection Time: 02/12/23  9:37 AM  Result Value Ref Range   Sodium 138 135 - 145 mEq/L   Potassium 4.1 3.5 - 5.1 mEq/L   Chloride 102 96 - 112 mEq/L   CO2 28 19 - 32 mEq/L   Glucose, Bld 95 70 - 99 mg/dL   BUN 6 6 - 23  mg/dL   Creatinine, Ser 6.96 0.40 - 1.20 mg/dL   Total Bilirubin 0.4 0.2 - 1.2 mg/dL   Alkaline Phosphatase 36 (L) 39 - 117 U/L   AST 10 0 - 37 U/L   ALT 10 0 - 35 U/L   Total Protein 6.7 6.0 - 8.3 g/dL   Albumin 4.3 3.5 - 5.2 g/dL   GFR 295.28 >41.32 mL/min    Comment: Calculated using the CKD-EPI Creatinine Equation (2021)   Calcium 9.7 8.4 - 10.5 mg/dL  Lipid panel     Status: None   Collection Time: 02/12/23  9:37 AM  Result Value Ref Range   Cholesterol 155 0 - 200 mg/dL    Comment: ATP III Classification       Desirable:  < 200 mg/dL               Borderline High:  200 - 239 mg/dL          High:  > = 440 mg/dL   Triglycerides 10.2 0.0 - 149.0 mg/dL    Comment: Normal:  <725 mg/dLBorderline High:  150 - 199 mg/dL   HDL 36.64 >40.34 mg/dL   VLDL 74.2 0.0 - 59.5 mg/dL   LDL Cholesterol 80 0 - 99 mg/dL   Total CHOL/HDL Ratio 3     Comment:                Men          Women1/2 Average Risk     3.4          3.3Average Risk          5.0          4.42X Average Risk          9.6          7.13X Average Risk          15.0          11.0                       NonHDL 99.02     Comment: NOTE:  Non-HDL goal should be 30 mg/dL higher than patient's LDL goal (i.e. LDL goal of < 70 mg/dL, would have non-HDL goal of < 100 mg/dL)  Vitamin G38     Status: None   Collection Time: 02/12/23  9:37 AM  Result Value Ref Range   Vitamin B-12 411 211 - 911 pg/mL  VITAMIN D 25 Hydroxy (Vit-D Deficiency, Fractures)     Status: Abnormal   Collection Time: 02/12/23  9:37 AM  Result Value Ref Range   VITD 26.05 (  L) 30.00 - 100.00 ng/mL  IBC + Ferritin     Status: Abnormal   Collection Time: 02/12/23  9:37 AM  Result Value Ref Range   Iron 70 42 - 145 ug/dL   Transferrin 161.0 960.4 - 360.0 mg/dL   Saturation Ratios 54.0 (L) 20.0 - 50.0 %   Ferritin 16.1 10.0 - 291.0 ng/mL   TIBC 382.2 250.0 - 450.0 mcg/dL  Urinalysis, Routine w reflex microscopic     Status: Abnormal   Collection Time: 02/12/23  9:37 AM   Result Value Ref Range   Color, Urine YELLOW Yellow;Lt. Yellow;Straw;Dark Yellow;Amber;Green;Red;Brown   APPearance CLEAR Clear;Turbid;Slightly Cloudy;Cloudy   Specific Gravity, Urine 1.020 1.000 - 1.030   pH 6.0 5.0 - 8.0   Total Protein, Urine NEGATIVE Negative   Urine Glucose NEGATIVE Negative   Ketones, ur NEGATIVE Negative   Bilirubin Urine NEGATIVE Negative   Hgb urine dipstick NEGATIVE Negative   Urobilinogen, UA 0.2 0.0 - 1.0   Leukocytes,Ua TRACE (A) Negative   Nitrite NEGATIVE Negative   WBC, UA 3-6/hpf (A) 0-2/hpf   RBC / HPF 0-2/hpf 0-2/hpf   Squamous Epithelial / HPF Rare(0-4/hpf) Rare(0-4/hpf)   Bacteria, UA Rare(<10/hpf) (A) None  TSH     Status: None   Collection Time: 02/12/23  9:37 AM  Result Value Ref Range   TSH 1.30 0.35 - 5.50 uIU/mL  POCT urine pregnancy     Status: None   Collection Time: 02/12/23 10:09 AM  Result Value Ref Range   Preg Test, Ur Negative Negative   MRI of the brain: 02/12/2023: No acute intracranial abnormality. Mucosal thickening in the right maxillary sinus with air-fluid level and mucosal thickening left frontal sinus. Nonspecific small scattered white matter lesions non specific  Review of Systems: Patient complains of symptoms per HPI as well as the following symptoms none. Pertinent negatives and positives per HPI. All others negative.   Social History   Socioeconomic History   Marital status: Married    Spouse name: Izora Gala   Number of children: 2   Years of education: Not on file   Highest education level: Not on file  Occupational History   Not on file  Tobacco Use   Smoking status: Never   Smokeless tobacco: Never  Vaping Use   Vaping status: Never Used  Substance and Sexual Activity   Alcohol use: No   Drug use: No   Sexual activity: Yes    Partners: Male    Birth control/protection: None  Other Topics Concern   Not on file  Social History Narrative   [place]    Moved to [place] in 2013 (was 32 years  old)   Married   3 kids - -8, 5, 8 month(s)   Mom and dad   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 Strain: Not on file  Food Insecurity: No Food Insecurity (05/27/2022)   Hunger Vital Sign    Worried About Running Out of Food in the Last Year: Never true    Ran Out of Food in the Last Year: Never true  Transportation Needs: No Transportation Needs (05/27/2022)   PRAPARE - Administrator, Civil Service (Medical): No    Lack of Transportation (Non-Medical): No  Physical Activity: Not on file  Stress: Not on file  Social Connections: Not on file  Intimate Partner Violence: Not At Risk (05/21/2022)   Humiliation, Afraid, Rape, and Kick questionnaire  Fear of Current or Ex-Partner: No    Emotionally Abused: No    Physically Abused: No    Sexually Abused: No    Family History  Problem Relation Age of Onset   Diabetes Mother    Hypertension Father    Diabetes Maternal Grandmother    Diabetes Maternal Grandfather    Hypertension Paternal Grandmother    Colon cancer Neg Hx    Esophageal cancer Neg Hx    Inflammatory bowel disease Neg Hx    Liver disease Neg Hx    Pancreatic cancer Neg Hx    Rectal cancer Neg Hx    Stomach cancer Neg Hx    Headache Neg Hx     Past Medical History:  Diagnosis Date   Frequent sinus infections    Iron deficiency anemia     Patient Active Problem List   Diagnosis Date Noted   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 05/23/2022   Gestational hypertension w/o significant proteinuria in 3rd trimester 05/23/2022   Primary thrombocytopenia (HCC) 02/28/2022   Vitamin D deficiency 12/02/2021   Anemia 11/26/2021   Depressive disorder 11/26/2021   Herpes simplex 11/26/2021   History of Helicobacter pylori infection 09/29/2021   Pyrosis 06/10/2021   Nausea without vomiting 06/10/2021   Abdominal pain, epigastric 06/10/2021   Current moderate episode  of major depressive disorder without prior episode (HCC) 04/30/2020   Iron deficiency anemia 03/28/2020   Fecal urgency 09/25/2019   History of iron deficiency anemia 09/25/2019   Non-intractable vomiting 09/25/2019   Chronic constipation 09/25/2019   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     FERROUS SULFATE PO Take 1 tablet by mouth daily.     No current facility-administered medications for this visit.    Allergies as of 02/25/2023 - Review Complete 02/25/2023  Allergen Reaction Noted   Dust mite extract Other (See Comments) 03/31/2020    Vitals: BP 111/72   Pulse 63   Ht 5\' 1"  (1.549 m)   Wt 223 lb (101.2 kg)   LMP 01/29/2023 (Exact Date)   BMI 42.14 kg/m  Last Weight:  Wt Readings from Last 1 Encounters:  02/25/23 223 lb (101.2 kg)   Last Height:   Ht Readings from Last 1 Encounters:  02/25/23 5\' 1"  (1.549 m)     Physical exam: Exam: Gen: NAD, conversant, well nourised, obese, well groomed                     CV: RRR, no MRG. No Carotid Bruits. No peripheral edema, warm, nontender Eyes: Conjunctivae clear without exudates or hemorrhage  Neuro: Detailed Neurologic Exam  Speech:    Speech is normal; fluent and spontaneous with normal comprehension.  Cognition:    The patient is oriented to person, place, and time;     recent and remote memory intact;     language fluent;     normal attention, concentration,     fund of knowledge Cranial Nerves:    The pupils are equal, round, and reactive to light. The fundi are normal and spontaneous venous pulsations are present. Visual fields are full to finger confrontation. Extraocular movements are intact. Trigeminal sensation is intact and the muscles of mastication are normal.  The face  is symmetric. The palate elevates in the midline. Hearing intact. Voice is normal. Shoulder shrug is normal. The tongue has normal motion without fasciculations.   Coordination: nml  Gait: nml  Motor Observation:    No asymmetry, no atrophy, and no involuntary movements noted. Tone:    Normal muscle tone.    Posture:    Posture is normal. normal erect    Strength:    Strength is V/V in the upper and lower limbs.      Sensation: intact to LT     Reflex Exam:  DTR's:    Deep tendon reflexes in the upper and lower extremities are normal bilaterally.   Toes:    The toes are downgoing bilaterally.   Clonus:    Clonus is absent.    Assessment/Plan: Patient with pressure headaches that started in the setting of pregnancy and weight gain.  She reports pressure headache without migrainous symptoms, transient visual obscurations, a feeling of fullness in her ear, hearing rushing water in her ear, neck pain.  She is in the demographic that would be concerning for idiopathic intracranial hypertension.  She also has a history of what sounds like seizures and given her episodic cognitive symptoms we will repeat an EEG as well, order a lumbar puncture, CT venogram.  When 12 had an abnormal eeg she was dropping things with weakness, emg/ncs was abnormal and the eeg was abnormal   Repeat the EEG. If EEG is negative in the office we can have a 24 hour in home Test for IDIOPATHIC INTRACRANIAL HYPERTENSION: Lumbar Puncture and CT Scan of the veins in your head [facility] imaging will call to schedule these)  Ophthalmology Dr. Dione Booze: The margis are crisp but question elevation of the ONHs send to dr Dione Booze  If workup c/w IDIOPATHIC INTRACRANIAL HYPERTENSION start topirmate or diamox, weight loss. Go to ED for any worsening vision or vision loss  Orders Placed This Encounter  Procedures   DG FL GUIDED LUMBAR PUNCTURE   CT VENOGRAM HEAD   Ambulatory referral to Ophthalmology   EEG adult      Discussed: Idiopathic Intracranial Hypertension  Idiopathic intracranial hypertension (IIH) is a condition that increases pressure around the brain. The fluid that surrounds the brain and spinal cord (cerebrospinal fluid, or CSF) increases and causes the pressure. Idiopathic means that the cause of this condition is not known. IIH affects the brain and spinal cord. If this condition is not treated, it can cause vision loss or blindness. What are the causes? The cause of this condition is not known. What increases the risk? The following factors may make you more likely to develop this condition: Being obese. Being a person who is female, between the ages of 22 and 82 years old, and who has not gone through menopause. Taking certain medicines, such as birth control, acne medicines, or steroids. What are the signs or symptoms? Symptoms of this condition include: Headaches. This is the most common symptom. Brief periods of vision changes Episodes Double vision, blurred vision, or poor side (peripheral) vision. Pain in the shoulders or neck. Nausea or vomiting. A sound like rushing water or a pulsing sound within the ears (pulsatile tinnitus), or ringing in the ears or ear fullness How is this diagnosed? This condition may be diagnosed based on: Your symptoms and medical history. Imaging tests of the brain, such as: CT scan. MRI. Magnetic resonance venogram (MRV) to check the veins. Diagnostic lumbar puncture. This is a procedure to remove  and examine a sample of CSF. This procedure can determine whether your fluid pressure is too high. An eye exam to check for swelling or nerve damage in the eyes. How is this treated? Treatment for this condition depends on the symptoms. The goal of treatment is to decrease the pressure around your brain. Common treatments include: Weight loss through healthy eating, salt restriction, and exercise, if you are overweight. Medicines to decrease the  production of CSF and lower the pressure within your skull. Medicines to prevent or treat headaches. Other treatments may include: Surgery to place drains (shunts) in your brain to remove extra fluid. Lumbar puncture to remove extra CSF. Follow these instructions at home: If you are overweight or obese, work with your health care provider to lose weight. Take over-the-counter and prescription medicines only as told by your health care provider. Ask your health care provider if the medicine prescribed to you requires you to avoid driving or using machinery. Do not use any products that contain nicotine or tobacco. These products include cigarettes, chewing tobacco, and vaping devices, such as e-cigarettes. If you need help quitting, ask your health care provider. Keep all follow-up visits. Your health care provider will need to monitor you regularly. Contact a health care provider if: You have changes in your vision, such as: Double vision. Blurred vision. Poor peripheral vision. Get help right away if: You have any of the following symptoms and they get worse or do not get better: Headaches. Nausea. Vomiting. Sudden trouble seeing. This information is not intended to replace advice given to you by your health care provider. Make sure you discuss any questions you have with your health care provider. Document Revised: 09/17/2021 Document Reviewed: 08/27/2021 Elsevier Patient Education  2024 Elsevier Inc.   Orders Placed This Encounter  Procedures   DG FL GUIDED LUMBAR PUNCTURE   CT VENOGRAM HEAD   Ambulatory referral to Ophthalmology   EEG adult   No orders of the defined types were placed in this encounter.   Cc: Jarold Motto, PA,  Janeece Agee, NP  Naomie Dean, MD  [facility] [address] [place], [place] [address]  Phone [phone] Fax [phone]

## 2023-02-25 NOTE — Patient Instructions (Addendum)
Repeat the EEG. If EEG is negative in the office we can have a 24 hour in home 2. Get a copy of the brain imaging and review 3. Test for IDIOPATHIC INTRACRANIAL HYPERTENSION: Lumbar Puncture and CT Scan of the veins in your head [facility] imaging will call to schedule these) 4. Ophthalmology Dr. Dione Booze   Idiopathic Intracranial Hypertension  Idiopathic intracranial hypertension (IIH) is a condition that increases pressure around the brain. The fluid that surrounds the brain and spinal cord (cerebrospinal fluid, or CSF) increases and causes the pressure. Idiopathic means that the cause of this condition is not known. IIH affects the brain and spinal cord. If this condition is not treated, it can cause vision loss or blindness. What are the causes? The cause of this condition is not known. What increases the risk? The following factors may make you more likely to develop this condition: Being obese. Being a person who is female, between the ages of 40 and 24 years old, and who has not gone through menopause. Taking certain medicines, such as birth control, acne medicines, or steroids. What are the signs or symptoms? Symptoms of this condition include: Headaches. This is the most common symptom. Brief periods of vision changes Episodes Double vision, blurred vision, or poor side (peripheral) vision. Pain in the shoulders or neck. Nausea or vomiting. A sound like rushing water or a pulsing sound within the ears (pulsatile tinnitus), or ringing in the ears or ear fullness How is this diagnosed? This condition may be diagnosed based on: Your symptoms and medical history. Imaging tests of the brain, such as: CT scan. MRI. Magnetic resonance venogram (MRV) to check the veins. Diagnostic lumbar puncture. This is a procedure to remove and examine a sample of CSF. This procedure can determine whether your fluid pressure is too high. An eye exam to check for swelling or nerve damage in the  eyes. How is this treated? Treatment for this condition depends on the symptoms. The goal of treatment is to decrease the pressure around your brain. Common treatments include: Weight loss through healthy eating, salt restriction, and exercise, if you are overweight. Medicines to decrease the production of CSF and lower the pressure within your skull. Medicines to prevent or treat headaches. Other treatments may include: Surgery to place drains (shunts) in your brain to remove extra fluid. Lumbar puncture to remove extra CSF. Follow these instructions at home: If you are overweight or obese, work with your health care provider to lose weight. Take over-the-counter and prescription medicines only as told by your health care provider. Ask your health care provider if the medicine prescribed to you requires you to avoid driving or using machinery. Do not use any products that contain nicotine or tobacco. These products include cigarettes, chewing tobacco, and vaping devices, such as e-cigarettes. If you need help quitting, ask your health care provider. Keep all follow-up visits. Your health care provider will need to monitor you regularly. Contact a health care provider if: You have changes in your vision, such as: Double vision. Blurred vision. Poor peripheral vision. Get help right away if: You have any of the following symptoms and they get worse or do not get better: Headaches. Nausea. Vomiting. Sudden trouble seeing. This information is not intended to replace advice given to you by your health care provider. Make sure you discuss any questions you have with your health care provider. Document Revised: 09/17/2021 Document Reviewed: 08/27/2021 Elsevier Patient Education  2024 [employer].

## 2023-02-26 ENCOUNTER — Telehealth: Payer: Self-pay | Admitting: Neurology

## 2023-02-26 ENCOUNTER — Telehealth: Payer: Self-pay | Admitting: *Deleted

## 2023-02-26 NOTE — Telephone Encounter (Signed)
Referral for ophthalmology fax to [facility]. Phone: [phone], Fax: [phone]. 

## 2023-03-02 NOTE — Telephone Encounter (Signed)
Noted  

## 2023-03-04 ENCOUNTER — Encounter: Payer: Self-pay | Admitting: Neurology

## 2023-03-13 NOTE — Discharge Instructions (Signed)

## 2023-03-16 ENCOUNTER — Ambulatory Visit
Admission: RE | Admit: 2023-03-16 | Discharge: 2023-03-16 | Disposition: A | Discharge status: Home / Self Care | Payer: No Typology Code available for payment source | Source: Ambulatory Visit | Attending: Neurology

## 2023-03-16 VITALS — BP 112/67 | HR 63

## 2023-03-16 DIAGNOSIS — R519 Headache, unspecified: Secondary | ICD-10-CM | POA: Diagnosis not present

## 2023-03-16 DIAGNOSIS — H539 Unspecified visual disturbance: Secondary | ICD-10-CM

## 2023-03-16 DIAGNOSIS — G932 Benign intracranial hypertension: Secondary | ICD-10-CM

## 2023-03-16 DIAGNOSIS — H4710 Unspecified papilledema: Secondary | ICD-10-CM

## 2023-03-16 DIAGNOSIS — G8929 Other chronic pain: Secondary | ICD-10-CM

## 2023-03-16 LAB — PROTEIN, CSF: Total Protein, CSF: 45 mg/dL (ref 15–45)

## 2023-03-16 LAB — GLUCOSE, CSF: Glucose, CSF: 62 mg/dL (ref 40–80)

## 2023-03-16 LAB — CSF CELL COUNT WITH DIFFERENTIAL
RBC Count, CSF: 128 {cells}/uL — ABNORMAL HIGH
TOTAL NUCLEATED CELL: 2 {cells}/uL (ref 0–5)

## 2023-03-24 ENCOUNTER — Encounter: Payer: Self-pay | Admitting: Physician Assistant

## 2023-03-24 ENCOUNTER — Ambulatory Visit (INDEPENDENT_AMBULATORY_CARE_PROVIDER_SITE_OTHER): Payer: No Typology Code available for payment source | Admitting: Neurology

## 2023-03-24 ENCOUNTER — Ambulatory Visit: Payer: No Typology Code available for payment source | Admitting: Physician Assistant

## 2023-03-24 ENCOUNTER — Other Ambulatory Visit: Payer: No Typology Code available for payment source

## 2023-03-24 VITALS — BP 110/70 | HR 58 | Temp 97.3°F | Ht 67.0 in | Wt 216.0 lb

## 2023-03-24 DIAGNOSIS — R4182 Altered mental status, unspecified: Secondary | ICD-10-CM

## 2023-03-24 DIAGNOSIS — R4189 Other symptoms and signs involving cognitive functions and awareness: Secondary | ICD-10-CM

## 2023-03-24 DIAGNOSIS — D500 Iron deficiency anemia secondary to blood loss (chronic): Secondary | ICD-10-CM | POA: Diagnosis not present

## 2023-03-24 DIAGNOSIS — Z8669 Personal history of other diseases of the nervous system and sense organs: Secondary | ICD-10-CM

## 2023-03-24 DIAGNOSIS — E669 Obesity, unspecified: Secondary | ICD-10-CM | POA: Diagnosis not present

## 2023-03-24 DIAGNOSIS — D229 Melanocytic nevi, unspecified: Secondary | ICD-10-CM | POA: Diagnosis not present

## 2023-03-24 DIAGNOSIS — H539 Unspecified visual disturbance: Secondary | ICD-10-CM

## 2023-03-24 DIAGNOSIS — R9401 Abnormal electroencephalogram [EEG]: Secondary | ICD-10-CM

## 2023-03-24 DIAGNOSIS — G932 Benign intracranial hypertension: Secondary | ICD-10-CM | POA: Diagnosis not present

## 2023-03-24 NOTE — Progress Notes (Signed)
Lauren Potter is a 32 y.o. female here to transfer care from Janeece Agee, NP. History of Present Illness:   Chief Complaint  Patient presents with   Transfer of care   Nevus    Pt has a mole on the right side of her nose since she was 15, has grown in size.   HPI  Intracranial HTN: Reports that she is trying to lose weight to control her intracranial hypertension. States that she is eating reduced portions (two bananas daily Mon-Thurs) and going to the gym. Notes that she is no longer waking up with headaches. She mentions that she is established with an ophthalmologist.   Reports that she started changing her diet/activity regimen 10/25. She has lost 7 LBS since last time she was in-office. Wt Readings from Last 3 Encounters:  03/24/23 216 lb (98 kg)  02/25/23 223 lb (101.2 kg)  02/12/23 224 lb 8 oz (101.8 kg)   Nevus: Reports she's had a mole on the right side of her nose since she was 15 that has been growing and would like to have dermatology examine it.  Denies pain or recent significant growth.   Anemia: Managed with iron supplements daily. Tolerates well, no side effects.   Notes she does have regular periods with two days of heavy blood flow.   Pregnancy Plans: Reports that she does not plan to have any more children and would like to explore the option of tubal litigation in the future but she notes that she is still recovering from her latest pregnancy (which resolved Jan. 2024).  Past Medical History:  Diagnosis Date   Frequent sinus infections    Iron deficiency anemia    Vaginal delivery    2016, 2019, 2024    Social History   Tobacco Use   Smoking status: Never   Smokeless tobacco: Never  Vaping Use   Vaping status: Never Used  Substance Use Topics   Alcohol use: No   Drug use: No   Past Surgical History:  Procedure Laterality Date   APPENDECTOMY     Family History  Problem Relation Age of Onset   Diabetes Mother    Hypertension Father     Diabetes Maternal Grandmother    Diabetes Maternal Grandfather    Hypertension Paternal Grandmother    Colon cancer Neg Hx    Esophageal cancer Neg Hx    Inflammatory bowel disease Neg Hx    Liver disease Neg Hx    Pancreatic cancer Neg Hx    Rectal cancer Neg Hx    Stomach cancer Neg Hx    Headache Neg Hx    Allergies  Allergen Reactions   Dust Mite Extract Other (See Comments)    Sneezing  Watery eyes   Current Medications:   Current Outpatient Medications:    Cholecalciferol (VITAMIN D-3 PO), Take 1 tablet by mouth daily., Disp: , Rfl:    FERROUS SULFATE PO, Take 1 tablet by mouth daily., Disp: , Rfl:   Review of Systems:   ROS See pertinent positives and negatives as per the HPI.  Vitals:   Vitals:   03/24/23 1038  BP: 110/70  Pulse: (!) 58  Temp: (!) 97.3 F (36.3 C)  TempSrc: Temporal  SpO2: 98%  Weight: 216 lb (98 kg)  Height: 5\' 7"  (1.702 m)     Body mass index is 33.83 kg/m.  Physical Exam:   Physical Exam Vitals and nursing note reviewed.  Constitutional:      General: She  is not in acute distress.    Appearance: She is well-developed. She is not ill-appearing or toxic-appearing.  Cardiovascular:     Rate and Rhythm: Normal rate and regular rhythm.     Pulses: Normal pulses.     Heart sounds: Normal heart sounds, S1 normal and S2 normal.  Pulmonary:     Effort: Pulmonary effort is normal.     Breath sounds: Normal breath sounds.  Skin:    General: Skin is warm and dry.     Comments: Hyperpigmented nevus to right nostril  Neurological:     Mental Status: She is alert.     GCS: GCS eye subscore is 4. GCS verbal subscore is 5. GCS motor subscore is 6.  Psychiatric:        Speech: Speech normal.        Behavior: Behavior normal. Behavior is cooperative.     Assessment and Plan:   Nevus Referral to dermatology for further eval  Idiopathic intracranial hypertension Reviewed notes from neurology Symptom(s) are vastly improved Continue  recommendations from neurology  Obesity, unspecified class, unspecified obesity type, unspecified whether serious comorbidity present Encouraged ongoing weight loss however did recommend more protein intake - handout provided Discussed that due to her ongoing low calorie diet, if attempting to increase exercise, needs to proceed with caution -- advised on adequate water intake and to stop activity if lightheadedness/dizziness  Iron deficiency anemia Ongoing Encouraged ongoing oral iron supplementation Refer back to hematology if hemoglobin starts to decrease per most recent hematology recommendations  I,Emily Lagle,acting as a scribe for Jarold Motto, PA.,have documented all relevant documentation on the behalf of Jarold Motto, PA,as directed by  Jarold Motto, PA while in the presence of Jarold Motto, [place].  I, Jarold Motto, [place], have reviewed all documentation for this visit. The documentation on 03/24/23 for the exam, diagnosis, procedures, and orders are all accurate and complete.  Jarold Motto, PA-C

## 2023-03-24 NOTE — Procedures (Signed)
    History:  32 year old woman with history of seziures, eeg to background classification  EEG classification: Awake and drowsy  Duration: 26 minutes   Technical aspects: This EEG study was done with scalp electrodes positioned according to the 10-20 International system of electrode placement. Electrical activity was reviewed with band pass filter of 1-70Hz , sensitivity of 7 uV/mm, display speed of 79mm/sec with a 60Hz  notched filter applied as appropriate. EEG data were recorded continuously and digitally stored.   Description of the recording: The background rhythms of this recording consists of a fairly well modulated medium amplitude alpha rhythm of 9 Hz that is reactive to eye opening and closure. Present in the anterior head region is a 15-20 Hz beta activity. Photic stimulation was performed, did not show any abnormalities. Hyperventilation was also performed, did not show any abnormalities. Drowsiness was manifested by background fragmentation. There was a single generalized sharp and slow wave. There was no focal slowing. There were no electrographic seizure identified.   Abnormality: Generalized sharp and slow wave discharge  Impression: This is an abnormal EEG recorded while drowsy and awake due to presence of generalized sharp and slow wave discharge. This is consistent with an increase epileptogenicity, generalized.   Windell Norfolk, MD [facility]

## 2023-03-24 NOTE — Patient Instructions (Signed)
It was great to see you!  Dermatology referral placed today  Let's follow-up in 3-6 months for Comprehensive Physical Exam (CPE) preventive care annual visit, sooner if you have concerns.  Take care,  Jarold Motto PA-C

## 2023-03-26 NOTE — Progress Notes (Signed)
Treat with Lamictal, up to 100 mg BID

## 2023-04-06 ENCOUNTER — Telehealth: Payer: Self-pay | Admitting: Neurology

## 2023-04-06 NOTE — Telephone Encounter (Signed)
Order form signed, then faxed to [facility]. Received a receipt of confirmation. [phone]

## 2023-04-06 NOTE — Telephone Encounter (Signed)
EEG is abnormal but patient denied any seizures that would correlate. Discussed will order 3- day ambulatory eeg.Can you place order and Dr. Teresa Coombs will read. Discussed with patient. Will Pod 4 fill ou tthe forms and have me sign? thanks  Abnormality: Generalized sharp and slow wave discharge   Impression: This is an abnormal EEG recorded while drowsy and awake due to presence of generalized sharp and slow wave discharge. This is consistent with an increase epileptogenicity, generalized.  [facility] Doretha Imus)

## 2023-04-06 NOTE — Telephone Encounter (Signed)
Thanks, will follow up on the amb eeg.

## 2023-04-06 NOTE — Telephone Encounter (Signed)
72 hour eeg order form completed and is pending Dr Trevor Mace signature.

## 2023-04-07 ENCOUNTER — Telehealth: Payer: Self-pay | Admitting: *Deleted

## 2023-04-07 NOTE — Telephone Encounter (Signed)
Pt mri will be mailed out on 04/08/2023 from [facility] phone #[phone]

## 2023-04-16 NOTE — Telephone Encounter (Signed)
R/c cd from [facility].Pt cd on Dr Lucia Gaskins desk

## 2023-05-08 DIAGNOSIS — R9401 Abnormal electroencephalogram [EEG]: Secondary | ICD-10-CM | POA: Diagnosis not present

## 2023-05-08 DIAGNOSIS — R569 Unspecified convulsions: Secondary | ICD-10-CM | POA: Diagnosis not present

## 2023-05-09 DIAGNOSIS — R9401 Abnormal electroencephalogram [EEG]: Secondary | ICD-10-CM | POA: Diagnosis not present

## 2023-05-09 DIAGNOSIS — R569 Unspecified convulsions: Secondary | ICD-10-CM | POA: Diagnosis not present

## 2023-05-10 DIAGNOSIS — R9401 Abnormal electroencephalogram [EEG]: Secondary | ICD-10-CM | POA: Diagnosis not present

## 2023-05-10 DIAGNOSIS — R569 Unspecified convulsions: Secondary | ICD-10-CM | POA: Diagnosis not present

## 2023-05-11 DIAGNOSIS — R9401 Abnormal electroencephalogram [EEG]: Secondary | ICD-10-CM | POA: Diagnosis not present

## 2023-05-11 DIAGNOSIS — R569 Unspecified convulsions: Secondary | ICD-10-CM | POA: Diagnosis not present

## 2023-05-18 ENCOUNTER — Other Ambulatory Visit: Payer: Self-pay | Admitting: Neurology

## 2023-05-18 ENCOUNTER — Encounter: Payer: Self-pay | Admitting: Neurology

## 2023-05-18 DIAGNOSIS — R9401 Abnormal electroencephalogram [EEG]: Secondary | ICD-10-CM

## 2023-05-18 DIAGNOSIS — Z8669 Personal history of other diseases of the nervous system and sense organs: Secondary | ICD-10-CM

## 2023-05-18 NOTE — Procedures (Signed)
 Clinical History:  Patient is a 33 y/o female being seen for headaches and cognitive problems. Had an abnormal EEG at age 81. She is having problems with brain fog and she feels like her 'brain is heavy'. She will have this for three hours and then it leaves her. 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 Routine EEG 03/2023 was abnormal due to sharp and slow wave discharge.   INTERMITTENT MONITORING with VIDEO TECHNICAL SUMMARY: This AVEEG was performed using equipment provided by [employer] utilizing Bluetooth ( [employer] ) amplifiers with continuous EEGT attended video collection using encrypted remote transmission via [employer] secured cellular tower network with data rates for each AVEEG performed. This is a therapist, music AVEEG, obtained, according to the 10-20 international electrode placement system, reformatted digitally into referential and bipolar montages. Data was acquired with a minimum of 21 bipolar connections and sampled at a minimum rate of 250 cycles per second per channel, maximum rate of 450 cycles per second per channel and two channels for EKG. The entire VEEG study was recorded through cable and or radio telemetry for subsequent analysis. Specified epochs of the AVEEG data were identified at the direction of the subject by the depression of a push button by the patient. Each patients event file included data acquired two minutes prior to the push button activation and continuing until two minutes afterwards. AVEEG files were reviewed on [facility] server, Licensed Software provided by [employer] with a digital high frequency filter set at 70 Hz and a low frequency filter set at 1 Hz with a paper speed of 3mm/s resulting in 10 seconds per digital page. This entire AVEEG was reviewed by the EEG Technologist. Random time samples, random sleep samples, clips, patient initiated push button files with included patient daily diary logs, EEG  Technologist pruned data was reviewed and verified for accuracy and validity by the governing reading neurologist in full details. This AEEGV was fully compliant with all requirements for CPT 97500 for setup, patient education, take down and administered by an EEG technologist.   Long-Term EEG with Video was monitored intermittently by a qualified EEG technologist for the entirety of the recording; quality check-ins were performed at a minimum of every two hours, checking and documenting real-time data and video to assure the integrity and quality of the recording (e.g., camera position, electrode integrity and impedance), and identify the need for maintenance. For intermittent monitoring, an EEG Technologist monitored no more than 12 patients concurrently. Diagnostic video was captured at least 80% of the time during the recording.   PATIENT EVENTS: A button press or notation was made 0 times. Patient log was reviewed with the patient at disconnect with the intent to reconcile events. See reconciled patient log in tech uploads.   DIARY WAS NOT RETURNED TECHNOLOGIST EVENTS: No clear epileptiform activity was detected by the reviewing neurodiagnostic technologist during the recording for further evaluation.   TIME SAMPLES: 10-minutes of every two hours recorded are reviewed as random time samples.   SLEEP SAMPLES: 5-minutes of every 24 hour recorded sleep cycle are reviewed as random sleep samples.   AWAKE: At maximal level of alertness, the posterior dominant background activity was continuous, reactive, low voltage rhythm of 7-9 Hz. This was symmetric, well-modulated, and attenuated with eye opening. Diffuse, symmetric, frontocentral beta range activity was present.  SLEEP:  N1 Sleep (Stage 1) was observed and characterized by the disappearance of alpha rhythm and the appearance of vertex activity.  N2 Sleep (Stage 2) was observed and characterized by vertex waves, K-complexes, and sleep spindles.   N3 (Stage 3) sleep was observed and characterized by high amplitude Delta activity of 20%.  REM sleep was observed.   EKG: There were no arrhythmias or abnormalities noted during this recording. Heart rate was approximately 80 BPM and regular.   Impression: This is a normal 72 hours ambulatory video EEG. There were no seizures, events or epileptiform discharges seen during this recording.   Nishanth Mccaughan, MD [facility]

## 2023-05-19 ENCOUNTER — Telehealth: Payer: No Typology Code available for payment source | Admitting: Neurology

## 2023-05-19 DIAGNOSIS — R519 Headache, unspecified: Secondary | ICD-10-CM

## 2023-05-19 MED ORDER — RIZATRIPTAN BENZOATE 10 MG PO TBDP: 10.0000 mg | ORAL_TABLET | ORAL | 11 refills | Status: AC | PRN

## 2023-05-19 NOTE — Progress Notes (Signed)
 [facility]    Provider:  Dr Potter Requesting Provider: Kip Ade, NP Primary Care Provider:  Job Lukes, PA  CC:  Headache  Virtual Visit via Video Note  I connected with Lauren Potter on 05/21/23 at 10:30 AM EST by a video enabled telemedicine application and verified that I am speaking with the correct person using two identifiers.  Location: Patient: home Provider: office   I discussed the limitations of evaluation and management by telemedicine and the availability of in person appointments. The patient expressed understanding and agreed to proceed.  History of Present Illness:    I discussed the assessment and treatment plan with the patient. The patient was provided an opportunity to ask questions and all were answered. The patient agreed with the plan and demonstrated an understanding of the instructions.   The patient was advised to call back or seek an in-person evaluation if the symptoms worsen or if the condition fails to improve as anticipated.  I provided 30 minutes of non-face-to-face time during this encounter.   Lauren KATHEE Ines, MD  05/19/2023: She is doing well as far as the headaches are concerned. She has a light headache. She may feel a little headache. She feels dizzy. The last 4 days she feels like a surge in her head, she has to sit down, feels lightheaded and vision feels blurry and she sits down, systolic BP was 111. a little on the low side she may be having some orthostasis but if worsens contact primary care. Since she had the LP she has been feeling much better and has been consistently improved. At this time will monitor. On MRi 10/24/ 2024 had possible acte sinusitis Mucosal thickening in the right maxillary sinus with air-fluid level and mucosal thickening left frontal sinus.  Recommended following up with primary care she states she had some fluid in the ear.   Discussed: she is not breastfeeding. We can try  maxalt  for headaches right at the onset and then can see how that works.   Reviewed notes, labs and imaging from outside physicians, which showed:  Reviewed MRI brain images on disk, unremarkable brain but had possible acte sinusitis Mucosal thickening in the right maxillary sinus with air-fluid level and mucosal thickening left frontal sinus.  Recommended following up with primary care she states she had some fluid in the ear  Ambulatory 3-day EEG: 05/18/2023 results: EKG: There were no arrhythmias or abnormalities noted during this recording. Heart rate was approximately 80 BPM and regular.   Impression: This is a normal 72 hours ambulatory video EEG. There were no seizures, events or epileptiform discharges seen during this recording. 03/24/2023: Routine EEG: Impression: This is an abnormal EEG recorded while drowsy and awake due to presence of generalized sharp and slow wave discharge. This is consistent with an increase epileptogenicity, generalized.  LP: Normal opening pressure: Lumbar puncture from a right-sided approach at L3-4. Opening pressure at the upper limits of normal, 18 cm of water, measured in the lateral decubitus position.   Medications tried that can be used in migraine management: tylenol , benadryl , ibuprofen , robaxin , naproxen , zofran , prednisone , zoloft , trazodone    Patient complains of symptoms per HPI as well as the following symptoms: headache . Pertinent negatives and positives per HPI. All others negative   HPI:  Lauren Potter is a 33 y.o. female here as requested by Kip Ade, NP for Headache. 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  Patient experiencing headaches and cognitive problems. When 12 had an abnormal eeg she was dropping things with weakness, emg/ncs was abnormal and the eeg was abnormal. She doesn;t have the right-sided weakness and numbness but having epsodes of brain fog, like her brain is heavy and her head is heavy. She can have it for 3 hours and then leaves, may come back, thought it was her glasses but she had new glasses. She can sometimes look and have to refocus. She has headaches more on the top of the head and pressure or like a tight ponytail and can get numb after a while. Bilateral (points to the parietal lobes) sometimes more on the right side. Even combing her hair hurts. The headaches are pretty continuous, she has a newborm but has a lot of help and baby sleeps through the night so she gest good sleep. Started after the pregnancy. Gained a lot of weight. She was 198 prior to pregnancy and went to 240 and she is around 220 now. No light or sounds sensitivity, not pulsating or pounding, some mild nausea but not vomiting. More pressure and decreased focus. Feels like ears are filled up. No other focal neurologic deficits, associated symptoms, inciting events or modifiable factors.   Reviewed notes, labs and imaging from outside physicians, which showed:  Recent Results (from the past 2160 hours)  Glucose, CSF     Status: None   Collection Time: 03/16/23 11:01 AM  Result Value Ref Range   Glucose, CSF 62 40 - 80 mg/dL  Protein, CSF     Status: None   Collection Time: 03/16/23 11:01 AM  Result Value Ref Range   Total Protein, CSF 45 15 - 45 mg/dL  CSF cell count with differential     Status: Abnormal   Collection Time: 03/16/23 11:01 AM  Result Value Ref Range   Color, CSF COLORLESS COLORLESS   Appearance, CSF CLEAR CLEAR   RBC Count, CSF 128  (H) 0 cells/uL   TOTAL NUCLEATED CELL 2 0 - 5 cells/uL   Monocyte/Macrophage CANCELED     Comment: Result canceled by the ancillary.   MRI of the brain: 02/12/2023: No acute intracranial abnormality. Mucosal thickening in the right maxillary sinus with air-fluid level and mucosal thickening left frontal sinus. Nonspecific small scattered white matter lesions non specific  Review of Systems: Patient complains of symptoms per HPI as well as the following symptoms none. Pertinent negatives and positives per HPI. All others negative.   Social History   Socioeconomic History   Marital status: Married    Spouse name: Arlon   Number of children: 2   Years of education: Not on file   Highest education level: Not on file  Occupational History   Not on file  Tobacco Use   Smoking status: Never   Smokeless tobacco: Never  Vaping Use   Vaping status: Never Used  Substance and Sexual Activity   Alcohol use: No   Drug use: No   Sexual activity: Yes    Partners: Male    Birth control/protection: None  Other Topics Concern   Not on file  Social History Narrative   [place]    Moved to [place]  in  [id number] (was 33 years old)   Married   3 kids - -8, 5, 8 month(s)   Mom and dad   Work from home full time -- works for [employer]   Social Drivers of [employer]: Not on [employer] Insecurity: No Food Insecurity (05/27/2022)   Hunger Vital Sign    Worried About Running Out of Food in the Last Year: Never true    Ran Out of Food in the Last Year: Never true  Transportation Needs: No Transportation Needs (05/27/2022)   PRAPARE - Administrator, Civil Service (Medical): No    Lack of Transportation (Non-Medical): No  Physical Activity: Not on file  Stress: Not on file  Social Connections: Not on file  Intimate Partner Violence: Not At Risk (05/21/2022)   Humiliation, Afraid, Rape, and Kick questionnaire    Fear of Current or Ex-Partner: No    Emotionally  Abused: No    Physically Abused: No    Sexually Abused: No    Family History  Problem Relation Age of Onset   Diabetes Mother    Hypertension Father    Diabetes Maternal Grandmother    Diabetes Maternal Grandfather    Hypertension Paternal Grandmother    Colon cancer Neg Hx    Esophageal cancer Neg Hx    Inflammatory bowel disease Neg Hx    Liver disease Neg Hx    Pancreatic cancer Neg Hx    Rectal cancer Neg Hx    Stomach cancer Neg Hx    Headache Neg Hx     Past Medical History:  Diagnosis Date   Frequent sinus infections    Iron  deficiency anemia    Vaginal delivery    2016, 2019, 2024    Patient Active Problem List   Diagnosis Date Noted   Gestational hypertension w/o significant proteinuria in 3rd trimester 05/23/2022   Primary thrombocytopenia (HCC) 02/28/2022   Vitamin D  deficiency 12/02/2021   Depressive disorder 11/26/2021   Herpes simplex 11/26/2021   History of Helicobacter pylori infection 09/29/2021   Pyrosis 06/10/2021   Abdominal pain, epigastric 06/10/2021   Current moderate episode of major depressive disorder without prior episode (HCC) 04/30/2020   Iron  deficiency anemia 03/28/2020   Fecal urgency 09/25/2019   History of iron  deficiency anemia 09/25/2019   Non-intractable vomiting 09/25/2019   Chronic constipation 09/25/2019   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 May repeat in 2 hours if needed 9 tablet 11   Cholecalciferol (VITAMIN D -3 PO) Take 1 tablet by mouth daily.     FERROUS SULFATE  PO Take 1 tablet by mouth daily.     No current facility-administered medications for this visit.    Allergies as of 05/19/2023 - Review Complete 03/24/2023  Allergen  Reaction Noted   Dust mite extract Other (See Comments) 03/31/2020    Vitals: There were no vitals taken for this visit. Last Weight:  Wt Readings from Last 1 Encounters:  03/24/23 216 lb (98 kg)   Last Height:   Ht Readings from Last 1 Encounters:  03/24/23 5' 7 (1.702 m)    Physical exam: Exam: Gen: NAD, conversant      CV: No palpitations or chest  pain or SOB. VS: Breathing at a normal rate. Not febrile. Eyes: Conjunctivae clear without exudates or hemorrhage  Neuro: Detailed Neurologic Exam  Speech:    Speech is normal; fluent and spontaneous with normal comprehension.  Cognition:    The patient is oriented to person, place, and time;     recent and remote memory intact;     language fluent;     normal attention, concentration, fund of knowledge Cranial Nerves:    The pupils are equal, round, and reactive to light. Visual fields are full Extraocular movements are intact.  The face is symmetric with normal sensation. The palate elevates in the midline. Hearing intact. Voice is normal. Shoulder shrug is normal. The tongue has normal motion without fasciculations.   Coordination: normal  Gait:    No abnormalities noted or reported  Motor Observation:   no involuntary movements noted. Tone:    Appears normal  Posture:    Posture is normal. normal erect    Strength:    Strength is anti-gravity and symmetric in the upper and lower limbs.      Sensation: intact to LT, no reports of numbness or tingling or paresthesias         Assessment/Plan: Patient with pressure headaches that started in the setting of pregnancy and weight gain.  Headaches are improved. Workup overall unremarkable except for acute sinusitis  Headaches improved per patient.  Discussed: she is not breastfeeding. We can try maxalt  for acute headaches right at the onset and then can see how that works.   Reviewed MRI brain images on disk, unremarkable brain but had possible acte  sinusitis:  Mucosal thickening in the right maxillary sinus with air-fluid level and mucosal thickening left frontal sinus.  Recommended following up with primary care she states she has some fluid in the ear, may also be due to hypotension   Ambulatory 3-day EEG: This is a normal 72 hours ambulatory video EEG. There were no seizures, events or epileptiform discharges seen during this recording.  LP: Normal opening pressure: 18  Follow up with primary care for dizziness/lightheadedness may be blood pressure related    Meds ordered this encounter  Medications   rizatriptan  (MAXALT -MLT) 10 MG disintegrating tablet    Sig: Take 1 tablet (10 mg total) by mouth as needed for migraine. May repeat in 2 hours if needed    Dispense:  9 tablet    Refill:  11    Cc: Kip Ade, NP,  Job Lukes, [place]  Lauren Epp, MD  [facility] [address] [place], [place] [address]  Phone [phone] Fax [phone]

## 2023-05-19 NOTE — Patient Instructions (Addendum)
 Follow up with primary care for dizziness/lightheadedness may be blood pressure related On MRi 02/26/2023: had possible acte sinusitis Mucosal thickening in the right maxillary sinus with air-fluid level and mucosal thickening left frontal sinus.  Recommended following up with primary care she states she had some fluid in the ear Headaches: Can try rizatriptan  Follow up in 6 months on video for check in   Rizatriptan  Disintegrating Tablets What is this medication? RIZATRIPTAN  (rye za TRIP tan) treats migraines. It works by blocking pain signals and narrowing blood vessels in the brain. It belongs to a group of medications called triptans. It is not used to prevent migraines. This medicine may be used for other purposes; ask your health care provider or pharmacist if you have questions. COMMON BRAND NAME(S): Maxalt -MLT What should I tell my care team before I take this medication? They need to know if you have any of these conditions: Circulation problems in fingers and toes Diabetes Heart disease High blood pressure High cholesterol History of irregular heartbeat History of stroke Stomach or intestine problems Tobacco use An unusual or allergic reaction to rizatriptan , other medications, foods, dyes, or preservatives Pregnant or trying to get pregnant Breast-feeding How should I use this medication? Take this medication by mouth. Take it as directed on the prescription label. You do not need water to take this medication. Leave the tablet in the sealed pack until you are ready to take it. With dry hands, open the pack and gently remove the tablet. If the tablet breaks or crumbles, throw it away. Use a new tablet. Place the tablet on the tongue and allow it to dissolve. Then, swallow it. Do not cut, crush, or chew this medication. Do not use it more often than directed. Talk to your care team about the use of this medication in children. While it may be prescribed for children as young as  6 years for selected conditions, precautions do apply. Overdosage: If you think you have taken too much of this medicine contact a poison control center or emergency room at once. NOTE: This medicine is only for you. Do not share this medicine with others. What if I miss a dose? This does not apply. This medication is not for regular use. What may interact with this medication? Do not take this medication with any of the following: Ergot alkaloids, such as dihydroergotamine, ergotamine MAOIs, such as Marplan, Nardil, Parnate Other medications for migraine headache, such as almotriptan, eletriptan, frovatriptan, naratriptan, sumatriptan, zolmitriptan This medication may also interact with the following: Certain medications for depression, anxiety, or other mental health conditions Propranolol This list may not describe all possible interactions. Give your health care provider a list of all the medicines, herbs, non-prescription drugs, or dietary supplements you use. Also tell them if you smoke, drink alcohol, or use illegal drugs. Some items may interact with your medicine. What should I watch for while using this medication? Visit your care team for regular checks on your progress. Tell your care team if your symptoms do not start to get better or if they get worse. This medication may affect your coordination, reaction time, or judgment. Do not drive or operate machinery until you know how this medication affects you. Sit up or stand slowly to reduce the risk of dizzy or fainting spells. If you take migraine medications for 10 or more days a month, your migraines may get worse. Keep a diary of headache days and medication use. Contact your care team if your migraine attacks occur  more frequently. What side effects may I notice from receiving this medication? Side effects that you should report to your care team as soon as possible: Allergic reactions--skin rash, itching, hives, swelling of the  face, lips, tongue, or throat Burning, pain, tingling, or color changes in the hands, arms, legs, or feet Heart attack--pain or tightness in the chest, shoulders, arms, or jaw, nausea, shortness of breath, cold or clammy skin, feeling faint or lightheaded Heart rhythm changes--fast or irregular heartbeat, dizziness, feeling faint or lightheaded, chest pain, trouble breathing Increase in blood pressure Irritability, confusion, fast or irregular heartbeat, muscle stiffness, twitching muscles, sweating, high fever, seizure, chills, vomiting, diarrhea, which may be signs of serotonin syndrome Raynaud syndrome--cool, numb, or painful fingers or toes that may change color from pale, to blue, to red Seizures Stroke--sudden numbness or weakness of the face, arm, or leg, trouble speaking, confusion, trouble walking, loss of balance or coordination, dizziness, severe headache, change in vision Sudden or severe stomach pain, bloody diarrhea, fever, nausea, vomiting Vision loss Side effects that usually do not require medical attention (report to your care team if they continue or are bothersome): Dizziness Unusual weakness or fatigue This list may not describe all possible side effects. Call your doctor for medical advice about side effects. You may report side effects to FDA at 1-800-FDA-1088. Where should I keep my medication? Keep out of the reach of children and pets. Store at room temperature between 15 and 30 degrees C (59 and 86 degrees F). Protect from light and moisture. Get rid of any unused medication after the expiration date. To get rid of medications that are no longer needed or have expired: Take the medication to a medication take-back program. Check with your pharmacy or law enforcement to find a location. If you cannot return the medication, check the label or package insert to see if the medication should be thrown out in the garbage or flushed down the toilet. If you are not sure, ask  your care team. If it is safe to put it in the trash, empty the medication out of the container. Mix the medication with cat litter, dirt, coffee grounds, or other unwanted substance. Seal the mixture in a bag or container. Put it in the trash. NOTE: This sheet is a summary. It may not cover all possible information. If you have questions about this medicine, talk to your doctor, pharmacist, or health care provider.  2024 Elsevier/Gold Standard (2021-08-22 00:00:00)

## 2023-05-21 NOTE — Addendum Note (Signed)
Addended by: Naomie Dean B on: 05/21/2023 10:25 PM   Modules accepted: Level of Service

## 2023-09-21 ENCOUNTER — Encounter: Payer: No Typology Code available for payment source | Admitting: Physician Assistant

## 2023-11-02 ENCOUNTER — Encounter: Admitting: Physician Assistant

## 2023-12-30 ENCOUNTER — Encounter: Payer: Self-pay | Admitting: Physician Assistant

## 2023-12-30 ENCOUNTER — Ambulatory Visit: Admitting: Physician Assistant

## 2023-12-30 VITALS — BP 110/70 | HR 56 | Temp 97.9°F | Ht 67.0 in | Wt 219.0 lb

## 2023-12-30 DIAGNOSIS — R42 Dizziness and giddiness: Secondary | ICD-10-CM | POA: Diagnosis not present

## 2023-12-30 DIAGNOSIS — R2240 Localized swelling, mass and lump, unspecified lower limb: Secondary | ICD-10-CM | POA: Diagnosis not present

## 2023-12-30 DIAGNOSIS — E669 Obesity, unspecified: Secondary | ICD-10-CM | POA: Diagnosis not present

## 2023-12-30 DIAGNOSIS — Z6834 Body mass index (BMI) 34.0-34.9, adult: Secondary | ICD-10-CM

## 2023-12-30 DIAGNOSIS — E559 Vitamin D deficiency, unspecified: Secondary | ICD-10-CM | POA: Diagnosis not present

## 2023-12-30 DIAGNOSIS — Z0000 Encounter for general adult medical examination without abnormal findings: Secondary | ICD-10-CM | POA: Diagnosis not present

## 2023-12-30 LAB — COMPREHENSIVE METABOLIC PANEL WITH GFR
ALT: 11 U/L (ref 0–35)
AST: 17 U/L (ref 0–37)
Albumin: 4.2 g/dL (ref 3.5–5.2)
Alkaline Phosphatase: 24 U/L — ABNORMAL LOW (ref 39–117)
BUN: 9 mg/dL (ref 6–23)
CO2: 23 meq/L (ref 19–32)
Calcium: 9.1 mg/dL (ref 8.4–10.5)
Chloride: 106 meq/L (ref 96–112)
Creatinine, Ser: 0.66 mg/dL (ref 0.40–1.20)
GFR: 116.28 mL/min (ref >60.00)
Glucose, Bld: 82 mg/dL (ref 70–99)
Potassium: 4.1 meq/L (ref 3.5–5.1)
Sodium: 139 meq/L (ref 135–145)
Total Bilirubin: 0.5 mg/dL (ref 0.2–1.2)
Total Protein: 7.3 g/dL (ref 6.0–8.3)

## 2023-12-30 LAB — CBC WITH DIFFERENTIAL/PLATELET
Basophils Absolute: 0 K/uL (ref 0.0–0.1)
Basophils Relative: 0.5 % (ref 0.0–3.0)
Eosinophils Absolute: 0 K/uL (ref 0.0–0.7)
Eosinophils Relative: 1.6 % (ref 0.0–5.0)
HCT: 36.6 % (ref 36.0–46.0)
Hemoglobin: 11.6 g/dL — ABNORMAL LOW (ref 12.0–15.0)
Lymphocytes Relative: 59.7 % — ABNORMAL HIGH (ref 12.0–46.0)
Lymphs Abs: 1.6 K/uL (ref 0.7–4.0)
MCHC: 31.7 g/dL (ref 30.0–36.0)
MCV: 72.5 fl — ABNORMAL LOW (ref 78.0–100.0)
Monocytes Absolute: 0.2 K/uL (ref 0.1–1.0)
Monocytes Relative: 7.6 % (ref 3.0–12.0)
Neutro Abs: 0.8 K/uL — ABNORMAL LOW (ref 1.4–7.7)
Neutrophils Relative %: 30.6 % — ABNORMAL LOW (ref 43.0–77.0)
Platelets: 158 K/uL (ref 150.0–400.0)
RBC: 5.04 Mil/uL (ref 3.87–5.11)
RDW: 15 % (ref 11.5–15.5)
WBC: 2.7 K/uL — ABNORMAL LOW (ref 4.0–10.5)

## 2023-12-30 LAB — IBC + FERRITIN
Ferritin: 24.7 ng/mL (ref 10.0–291.0)
Iron: 81 ug/dL (ref 42–145)
Saturation Ratios: 20.9 % (ref 20.0–50.0)
TIBC: 387.8 ug/dL (ref 250.0–450.0)
Transferrin: 277 mg/dL (ref 212.0–360.0)

## 2023-12-30 LAB — LIPID PANEL
Cholesterol: 139 mg/dL (ref 0–200)
HDL: 51.6 mg/dL (ref >39.00)
LDL Cholesterol: 78 mg/dL (ref 0–99)
NonHDL: 87.77
Total CHOL/HDL Ratio: 3
Triglycerides: 51 mg/dL (ref 0.0–149.0)
VLDL: 10.2 mg/dL (ref 0.0–40.0)

## 2023-12-30 LAB — VITAMIN D 25 HYDROXY (VIT D DEFICIENCY, FRACTURES): VITD: 26.14 ng/mL — ABNORMAL LOW (ref 30.00–100.00)

## 2023-12-30 LAB — HEMOGLOBIN A1C: Hgb A1c MFr Bld: 5.9 % (ref 4.6–6.5)

## 2023-12-30 LAB — TSH: TSH: 1.15 u[IU]/mL (ref 0.35–5.50)

## 2023-12-30 NOTE — Progress Notes (Signed)
 Subjective:    Lauren Potter is a 33 y.o. female and is here for a comprehensive physical exam.  HPI  There are no preventive care reminders to display for this patient.  Discussed the use of AI scribe software for clinical note transcription with the patient, who gave verbal consent to proceed.  History of Present Illness   Lauren Potter is a 33 year old female who presents for weight management and evaluation of lower extremity swelling.  She is experiencing difficulty managing her weight and aims to lose at least 40 pounds within the next six months. She has attempted walking for exercise but struggles with consistency, especially after a trip to [place]. She is open to trying a GLP-1 pill for weight management when available, as she prefers to avoid injections. She is currently at her highest weight outside of pregnancy.  She experiences significant swelling in her feet, particularly after prolonged standing or stress, which worsened during her trip to [place]. The swelling is uncomfortable with a feeling of pressure but not painful. She avoids salty foods and alcohol. Elevating her legs at home may help alleviate symptoms.  She experiences dizziness and a sensation of impending fainting, especially when standing for long periods. A three-day EEG was normal despite an initial abnormal EEG. She had a spinal tap due to elevated cerebral spinal fluid pressure. Dizziness occurs when transitioning from sitting to standing and sometimes when standing for extended periods. She has no shortness of breath.  Her family history includes her maternal grandfather having a stroke and diabetes, and her mother having diabetes and cataracts. She does not smoke or drink alcohol. She works from home and can elevate her legs if needed. She is taking probiotics but is unsure of their effectiveness. She is not using any form of pregnancy prevention and reports having just had a period with another  expected soon.        Health Maintenance: Immunizations -- UpToDate; encouraged flu shot Colonoscopy -- n/a Mammogram -- n/a PAP -- UpToDate; but scheduled for annual visit later this fall Bone Density -- n/a Diet -- overall healthy diet Exercise -- walking as able  Sleep habits -- no concerns Mood -- stable  UTD with dentist? - yes UTD with eye doctor? - no  Weight history: Wt Readings from Last 10 Encounters:  12/30/23 219 lb (99.3 kg)  03/24/23 216 lb (98 kg)  02/25/23 223 lb (101.2 kg)  02/12/23 224 lb 8 oz (101.8 kg)  01/30/23 222 lb 6 oz (100.9 kg)  05/21/22 249 lb 4.8 oz (113.1 kg)  09/27/21 208 lb (94.3 kg)  09/12/21 203 lb 3.2 oz (92.2 kg)  09/12/21 201 lb (91.2 kg)  06/07/21 202 lb 4.8 oz (91.8 kg)   Body mass index is 34.3 kg/m. Patient's last menstrual period was 12/03/2023 (exact date).  Alcohol use:  reports no history of alcohol use.  Tobacco use:  Tobacco Use: Low Risk  (12/30/2023)   Patient History    Smoking Tobacco Use: Never    Smokeless Tobacco Use: Never    Passive Exposure: Not on file   Eligible for lung cancer screening? no     12/30/2023   10:44 AM  Depression screen PHQ 2/9  Decreased Interest 0  Down, Depressed, Hopeless 0  PHQ - 2 Score 0     Other providers/specialists: Patient Care Team: Job Lukes, [place] as PCP - General (Physician Assistant) Patient, [name] (General Practice)    PMHx, SurgHx, SocialHx,  Medications, and Allergies were reviewed in the Visit Navigator and updated as appropriate.   Past Medical History:  Diagnosis Date   Frequent sinus infections    Iron  deficiency anemia    Vaginal delivery    2016, 2019, 2024     Past Surgical History:  Procedure Laterality Date   APPENDECTOMY       Family History  Problem Relation Age of Onset   Diabetes Mother    Hypertension Father    Diabetes Maternal Grandmother    Diabetes Maternal Grandfather    Stroke Maternal Grandfather     Hypertension Paternal Grandmother    Colon cancer Neg Hx    Esophageal cancer Neg Hx    Inflammatory bowel disease Neg Hx    Liver disease Neg Hx    Pancreatic cancer Neg Hx    Rectal cancer Neg Hx    Stomach cancer Neg Hx    Headache Neg Hx     Social History   Tobacco Use   Smoking status: Never   Smokeless tobacco: Never  Vaping Use   Vaping status: Never Used  Substance Use Topics   Alcohol use: No   Drug use: No    Review of Systems:   Review of Systems  Constitutional:  Negative for chills, fever, malaise/fatigue and weight loss.  HENT:  Negative for hearing loss, sinus pain and sore throat.   Respiratory:  Negative for cough and hemoptysis.   Cardiovascular:  Positive for leg swelling. Negative for chest pain, palpitations and PND.  Gastrointestinal:  Negative for abdominal pain, constipation, diarrhea, heartburn, nausea and vomiting.  Genitourinary:  Negative for dysuria, frequency and urgency.  Musculoskeletal:  Negative for back pain, myalgias and neck pain.  Skin:  Negative for itching and rash.  Neurological:  Positive for dizziness. Negative for tingling, seizures and headaches.  Endo/Heme/Allergies:  Negative for polydipsia.  Psychiatric/Behavioral:  Negative for depression. The patient is not nervous/anxious.     Objective:   BP 110/70 (BP Location: Left Arm, Patient Position: Sitting, Cuff Size: Large)   Pulse (!) 56   Temp 97.9 F (36.6 C) (Temporal)   Ht 5' 7 (1.702 m)   Wt 219 lb (99.3 kg)   LMP 12/03/2023 (Exact Date)   SpO2 99%   Breastfeeding No   BMI 34.30 kg/m  Body mass index is 34.3 kg/m.   General Appearance:    Alert, cooperative, no distress, appears stated age  Head:    Normocephalic, without obvious abnormality, atraumatic  Eyes:    PERRL, conjunctiva/corneas clear, EOM's intact, fundi    benign, both eyes  Ears:    Normal TM's and external ear canals, both ears  Nose:   Nares normal, septum midline, mucosa normal, no  drainage    or sinus tenderness  Throat:   Lips, mucosa, and tongue normal; teeth and gums normal  Neck:   Supple, symmetrical, trachea midline, no adenopathy;    thyroid :  no enlargement/tenderness/nodules; no carotid   bruit or JVD  Back:     Symmetric, no curvature, ROM normal, no CVA tenderness  Lungs:     Clear to auscultation bilaterally, respirations unlabored  Chest Wall:    No tenderness or deformity   Heart:    Regular rate and rhythm, S1 and S2 normal, no murmur, rub or gallop  Breast Exam:    Deferred  Abdomen:     Soft, non-tender, bowel sounds active all four quadrants,    no masses, no organomegaly  Genitalia:  Deferred  Extremities:   Extremities normal, atraumatic, no cyanosis or edema  Pulses:   2+ and symmetric all extremities  Skin:   Skin color, texture, turgor normal, no rashes or lesions  Lymph nodes:   Cervical, supraclavicular, and axillary nodes normal  Neurologic:   CNII-XII intact, normal strength, sensation and reflexes    throughout    Assessment/Plan:   Assessment and Plan  Comprehensive Physical Exam (CPE) preventive care annual visit Today patient counseled on age appropriate routine health concerns for screening and prevention, each reviewed and up to date or declined. Immunizations reviewed and up to date or declined. Labs ordered and reviewed. Risk factors for depression reviewed and negative. Hearing function and visual acuity are intact. ADLs screened and addressed as needed. Functional ability and level of safety reviewed and appropriate. Education, counseling and referrals performed based on assessed risks today. Patient provided with a copy of personalized plan for preventive services.     Obesity with associated peripheral edema and dizziness Obesity with peripheral edema and dizziness. She declined injectable GLP-1 agonists, open to oral GLP-1 if this becomes available to prescribe. Edema linked to prolonged standing and stress. Dizziness  possibly due to postural changes, differential includes POTS. Family history of stroke and diabetes. - Order blood work to assess blood sugar levels for prediabetes. - Consider metformin if blood sugar elevated. - Consider oral GLP-1 agonist if/when available - Consider as-needed diuretic if blood work normal to manage edema. - Provided information on POTS. - Consider heart monitor if dizziness persists.  Contraception counseling Discussed contraception options. She is considering tubal ligation if her husband does not opt for a vasectomy. - Discuss contraception options, including tubal ligation and husband's vasectomy.          Lucie Buttner, PA-C [facility]

## 2023-12-30 NOTE — Patient Instructions (Signed)
 It was great to see you!  I think you may have Postural Orthostatic Tachycardia Syndrome (POTS) --- look into this and let me know your thoughts  We could trial water pill  We could consider heart monitor to assess your heart during these lightheadedness episodes -- just let me know   Message me around Halloween to see if oral glp are ready to prescribe  Let's follow-up in 1 year, sooner if you have concerns.  Take care,  Lucie Buttner PA-C

## 2023-12-31 ENCOUNTER — Ambulatory Visit: Payer: Self-pay | Admitting: Physician Assistant

## 2023-12-31 DIAGNOSIS — D709 Neutropenia, unspecified: Secondary | ICD-10-CM

## 2024-01-11 ENCOUNTER — Ambulatory Visit: Admitting: Dermatology

## 2024-01-27 ENCOUNTER — Other Ambulatory Visit

## 2024-02-12 ENCOUNTER — Other Ambulatory Visit (INDEPENDENT_AMBULATORY_CARE_PROVIDER_SITE_OTHER)

## 2024-02-12 DIAGNOSIS — D709 Neutropenia, unspecified: Secondary | ICD-10-CM

## 2024-02-12 DIAGNOSIS — K37 Unspecified appendicitis: Secondary | ICD-10-CM

## 2024-02-15 ENCOUNTER — Other Ambulatory Visit (INDEPENDENT_AMBULATORY_CARE_PROVIDER_SITE_OTHER)

## 2024-02-15 DIAGNOSIS — D508 Other iron deficiency anemias: Secondary | ICD-10-CM | POA: Diagnosis not present

## 2024-02-15 LAB — CBC WITH DIFFERENTIAL/PLATELET
Basophils Absolute: 0 K/uL (ref 0.0–0.1)
Basophils Relative: 0.5 % (ref 0.0–3.0)
Eosinophils Absolute: 0 K/uL (ref 0.0–0.7)
Eosinophils Relative: 1.1 % (ref 0.0–5.0)
HCT: 42.6 % (ref 36.0–46.0)
Hemoglobin: 13.4 g/dL (ref 12.0–15.0)
Lymphocytes Relative: 53.1 % — ABNORMAL HIGH (ref 12.0–46.0)
Lymphs Abs: 2 K/uL (ref 0.7–4.0)
MCHC: 31.5 g/dL (ref 30.0–36.0)
MCV: 72 fl — ABNORMAL LOW (ref 78.0–100.0)
Monocytes Absolute: 0.3 K/uL (ref 0.1–1.0)
Monocytes Relative: 7.3 % (ref 3.0–12.0)
Neutro Abs: 1.5 K/uL (ref 1.4–7.7)
Neutrophils Relative %: 38 % — ABNORMAL LOW (ref 43.0–77.0)
Platelets: 151 K/uL (ref 150.0–400.0)
RBC: 5.91 Mil/uL — ABNORMAL HIGH (ref 3.87–5.11)
RDW: 15.2 % (ref 11.5–15.5)
WBC: 3.8 K/uL — ABNORMAL LOW (ref 4.0–10.5)

## 2024-02-18 ENCOUNTER — Ambulatory Visit: Payer: Self-pay | Admitting: Physician Assistant

## 2024-02-18 DIAGNOSIS — D709 Neutropenia, unspecified: Secondary | ICD-10-CM

## 2024-04-04 ENCOUNTER — Encounter: Payer: Self-pay | Admitting: Physician Assistant

## 2024-05-20 ENCOUNTER — Encounter: Payer: Self-pay | Admitting: Hematology and Oncology

## 2024-05-20 LAB — HM PAP SMEAR

## 2024-05-23 ENCOUNTER — Ambulatory Visit: Admitting: Physician Assistant

## 2024-05-25 ENCOUNTER — Other Ambulatory Visit: Payer: Self-pay | Admitting: Physician Assistant

## 2024-05-25 ENCOUNTER — Ambulatory Visit: Admitting: Physician Assistant

## 2024-05-25 ENCOUNTER — Encounter: Payer: Self-pay | Admitting: Physician Assistant

## 2024-05-25 VITALS — BP 110/70 | HR 68 | Temp 97.2°F | Ht 67.0 in | Wt 223.5 lb

## 2024-05-25 DIAGNOSIS — Z20818 Contact with and (suspected) exposure to other bacterial communicable diseases: Secondary | ICD-10-CM

## 2024-05-25 DIAGNOSIS — R059 Cough, unspecified: Secondary | ICD-10-CM | POA: Diagnosis not present

## 2024-05-25 DIAGNOSIS — E669 Obesity, unspecified: Secondary | ICD-10-CM

## 2024-05-25 DIAGNOSIS — R6889 Other general symptoms and signs: Secondary | ICD-10-CM

## 2024-05-25 DIAGNOSIS — D500 Iron deficiency anemia secondary to blood loss (chronic): Secondary | ICD-10-CM | POA: Diagnosis not present

## 2024-05-25 DIAGNOSIS — H938X3 Other specified disorders of ear, bilateral: Secondary | ICD-10-CM | POA: Diagnosis not present

## 2024-05-25 DIAGNOSIS — E88819 Insulin resistance, unspecified: Secondary | ICD-10-CM | POA: Diagnosis not present

## 2024-05-25 DIAGNOSIS — Z6835 Body mass index (BMI) 35.0-35.9, adult: Secondary | ICD-10-CM | POA: Diagnosis not present

## 2024-05-25 LAB — CBC WITH DIFFERENTIAL/PLATELET
Basophils Absolute: 0 K/uL (ref 0.0–0.1)
Basophils Relative: 0.4 % (ref 0.0–3.0)
Eosinophils Absolute: 0.1 K/uL (ref 0.0–0.7)
Eosinophils Relative: 1.6 % (ref 0.0–5.0)
HCT: 37.9 % (ref 36.0–46.0)
Hemoglobin: 12.1 g/dL (ref 12.0–15.0)
Lymphocytes Relative: 49 % — ABNORMAL HIGH (ref 12.0–46.0)
Lymphs Abs: 1.8 K/uL (ref 0.7–4.0)
MCHC: 31.8 g/dL (ref 30.0–36.0)
MCV: 72.2 fl — ABNORMAL LOW (ref 78.0–100.0)
Monocytes Absolute: 0.3 K/uL (ref 0.1–1.0)
Monocytes Relative: 7.7 % (ref 3.0–12.0)
Neutro Abs: 1.5 K/uL (ref 1.4–7.7)
Neutrophils Relative %: 41.3 % — ABNORMAL LOW (ref 43.0–77.0)
Platelets: 188 K/uL (ref 150.0–400.0)
RBC: 5.25 Mil/uL — ABNORMAL HIGH (ref 3.87–5.11)
RDW: 15.2 % (ref 11.5–15.5)
WBC: 3.7 K/uL — ABNORMAL LOW (ref 4.0–10.5)

## 2024-05-25 LAB — IBC + FERRITIN
Ferritin: 19.2 ng/mL (ref 10.0–291.0)
Iron: 84 ug/dL (ref 42–145)
Saturation Ratios: 21.4 % (ref 20.0–50.0)
TIBC: 392 ug/dL (ref 250.0–450.0)
Transferrin: 280 mg/dL (ref 212.0–360.0)

## 2024-05-25 LAB — POCT RAPID STREP A (OFFICE): Rapid Strep A Screen: NEGATIVE

## 2024-05-25 LAB — HEMOGLOBIN A1C: Hgb A1c MFr Bld: 5.9 % (ref 4.6–6.5)

## 2024-05-25 MED ORDER — AZELASTINE HCL 0.1 % NA SOLN: 1.0000 | Freq: Two times a day (BID) | NASAL | 12 refills | Status: AC

## 2024-05-25 MED ORDER — WEGOVY 1.5 MG PO TABS: 1.5000 mg | ORAL_TABLET | Freq: Every day | ORAL | 2 refills | Status: DC

## 2024-05-25 NOTE — Patient Instructions (Addendum)
 Lauren Potter

## 2024-05-25 NOTE — Progress Notes (Signed)
 "  History of Present Illness:   Chief Complaint  Patient presents with   Ear Fullness    Pt c/o ears still feel full right > left.   Obesity    Pt would like to discuss weight loss medications.   Discussed the use of AI scribe software for clinical note transcription with the patient, who gave verbal consent to proceed.  History of Present Illness   Lauren Potter is a 34 year old female who presents with concerns about recurrent upper respiratory symptoms and weight management.  Lauren Potter reports recurrent upper respiratory symptoms with a sensation of ear blockage similar to a prior episode thought to be fluid-related. Lauren Potter has mild cough without nasal congestion, sore throat, or shortness of breath.  Lauren Potter seeks help with weight management. Lauren Potter has tried dieting, gym workouts, and intermittent fasting without adequate weight loss and aims to lose 40 pounds by the end of the year. Lauren Potter is exercising daily, walking a minimum of 30 minutes daily. Lauren Potter has been trying to lose weight for the past 6 months and has maintained a consistent routine.  Lauren Potter has anemia with prior iron  infusions around the time of childbirth. Her menses are heavy for the first two days then taper. Lauren Potter has been monitored for iron  deficiency and prediabetes, with prior blood work showing low white blood cell count and low hemoglobin.       Past Medical History:  Diagnosis Date   Frequent sinus infections    Iron  deficiency anemia    Vaginal delivery    2016, 2019, 2024     Social History[1]  Past Surgical History:  Procedure Laterality Date   APPENDECTOMY      Family History  Problem Relation Age of Onset   Diabetes Mother    Hypertension Father    Diabetes Maternal Grandmother    Diabetes Maternal Grandfather    Stroke Maternal Grandfather    Hypertension Paternal Grandmother    Colon cancer Neg Hx    Esophageal cancer Neg Hx    Inflammatory bowel disease Neg Hx    Liver disease Neg Hx    Pancreatic  cancer Neg Hx    Rectal cancer Neg Hx    Stomach cancer Neg Hx    Headache Neg Hx     Allergies[2]  Current Medications:  Current Medications[3]   Review of Systems:   Negative unless otherwise specified per HPI.  Vitals:   Vitals:   05/25/24 0824  BP: 110/70  Pulse: 68  Temp: (!) 97.2 F (36.2 C)  TempSrc: Temporal  Weight: 223 lb 8 oz (101.4 kg)  Height: 5' 7 (1.702 m)     Body mass index is 35.01 kg/m.  Physical Exam:   Physical Exam Vitals and nursing note reviewed.  Constitutional:      General: Lauren Potter is not in acute distress.    Appearance: Lauren Potter is well-developed. Lauren Potter is not ill-appearing or toxic-appearing.  HENT:     Head: Normocephalic and atraumatic.     Right Ear: Ear canal and external ear normal. A middle ear effusion is present. Tympanic membrane is not erythematous, retracted or bulging.     Left Ear: Tympanic membrane, ear canal and external ear normal. Tympanic membrane is not erythematous, retracted or bulging.     Nose: Nose normal.     Right Sinus: No maxillary sinus tenderness or frontal sinus tenderness.     Left Sinus: No maxillary sinus tenderness or frontal sinus tenderness.     Mouth/Throat:  Pharynx: Uvula midline. No posterior oropharyngeal erythema.  Eyes:     General: Lids are normal.     Conjunctiva/sclera: Conjunctivae normal.  Neck:     Trachea: Trachea normal.  Cardiovascular:     Rate and Rhythm: Normal rate and regular rhythm.     Heart sounds: Normal heart sounds, S1 normal and S2 normal.  Pulmonary:     Effort: Pulmonary effort is normal.     Breath sounds: Normal breath sounds. No decreased breath sounds, wheezing, rhonchi or rales.  Lymphadenopathy:     Cervical: No cervical adenopathy.  Skin:    General: Skin is warm and dry.  Neurological:     Mental Status: Lauren Potter is alert.  Psychiatric:        Speech: Speech normal.        Behavior: Behavior normal. Behavior is cooperative.    Results for orders placed or  performed in visit on 05/25/24  POCT rapid strep A  Result Value Ref Range   Rapid Strep A Screen Negative Negative    Assessment and Plan:   Assessment and Plan    Flu-like symptoms // Strep throat exposure Negative strep test. Likely viral etiology. Follow up if new/worsening symptom(s)   Bilateral ear fullness Intermittent fullness likely due to recent colds and trapped fluid. - Prescribed Astelin  nasal spray.  Obesity Interest in weight loss. Previous diet and exercise attempts unsuccessful. Discussed oral Wegovy , its mechanism, side effects, and insurance options. - Prescribed oral Wegovy  - Submitted prior authorization for Wegovy  - Advised taking Wegovy  with minimal water, avoid eating/drinking for 30 minutes post-dose. - Discussed side effects and management. - Scheduled follow-up in three months.  Iron  deficiency anemia due to chronic blood loss Anemia likely due to chronic blood loss. Hemoglobin improving but slightly low. No thalassemia or hemoglobinopathy. - Ordered blood work for hemoglobin and iron  levels. - Consider oral iron  if hemoglobin drops significantly.  Insulin resistance Previous A1c in prediabetic range. Monitoring for diabetes progression. - Ordered blood work to check A1c levels.       Lucie Buttner, PA-C    [1]  Social History Tobacco Use   Smoking status: Never   Smokeless tobacco: Never  Vaping Use   Vaping status: Never Used  Substance Use Topics   Alcohol use: No   Drug use: No  [2]  Allergies Allergen Reactions   Dust Mite Extract Other (See Comments)    Sneezing  Watery eyes  [3]  Current Outpatient Medications:    Cholecalciferol (VITAMIN D -3 PO), Take 1 tablet by mouth daily., Disp: , Rfl:    FERROUS SULFATE  PO, Take 1 tablet by mouth daily., Disp: , Rfl:    Probiotic Product (PROBIOTIC DAILY) CAPS, Take 1 capsule by mouth daily in the afternoon., Disp: , Rfl:    rizatriptan  (MAXALT -MLT) 10 MG disintegrating tablet,  Take 1 tablet (10 mg total) by mouth as needed for migraine. May repeat in 2 hours if needed, Disp: 9 tablet, Rfl: 11  "

## 2024-05-25 NOTE — Telephone Encounter (Signed)
 Lauren Potter, see message and advise if pt is self pay?

## 2024-05-26 ENCOUNTER — Ambulatory Visit: Payer: Self-pay | Admitting: Physician Assistant

## 2024-08-15 ENCOUNTER — Ambulatory Visit: Admitting: Dermatology

## 2024-08-26 ENCOUNTER — Ambulatory Visit: Admitting: Physician Assistant

## 2025-01-02 ENCOUNTER — Encounter: Admitting: Physician Assistant
